# Patient Record
Sex: Male | Born: 1985 | Race: White | Hispanic: No | Marital: Single | Smoking: Current every day smoker
Health system: Southern US, Community
[De-identification: ages and names within clinical notes are randomized; demographics above are authoritative.]

## PROBLEM LIST (undated history)

## (undated) DIAGNOSIS — L0390 Cellulitis, unspecified: Secondary | ICD-10-CM

---

## 2006-12-24 ENCOUNTER — Ambulatory Visit: Payer: Self-pay | Admitting: Internal Medicine

## 2007-08-19 ENCOUNTER — Emergency Department: Payer: Self-pay | Admitting: Emergency Medicine

## 2008-05-27 ENCOUNTER — Inpatient Hospital Stay: Payer: Self-pay | Admitting: Internal Medicine

## 2008-08-29 ENCOUNTER — Emergency Department: Payer: Self-pay | Admitting: Emergency Medicine

## 2008-10-24 ENCOUNTER — Emergency Department: Payer: Self-pay | Admitting: Emergency Medicine

## 2011-03-03 ENCOUNTER — Emergency Department: Payer: Self-pay | Admitting: *Deleted

## 2012-01-08 ENCOUNTER — Emergency Department: Payer: Self-pay | Admitting: Emergency Medicine

## 2013-11-21 IMAGING — CT CT NECK WITHOUT CONTRAST
1 of 2 series · 9 of 14 positions shown, 12 images · non-contrast
Comparison: none

REASON FOR EXAM: trauma to soft tissue
COMMENTS:

[Series 3: soft tissue · axial · 0.44mm/px · z∈[-380,-150]mm · 9 of 97 slices shown, 12 images]
[im 10/97  soft-tissue]
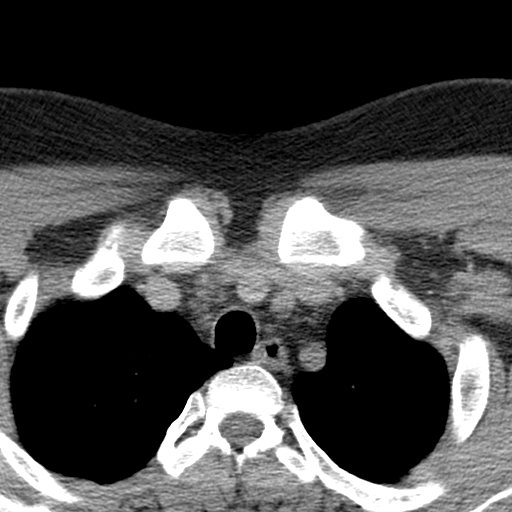
[im 10/97  bone]
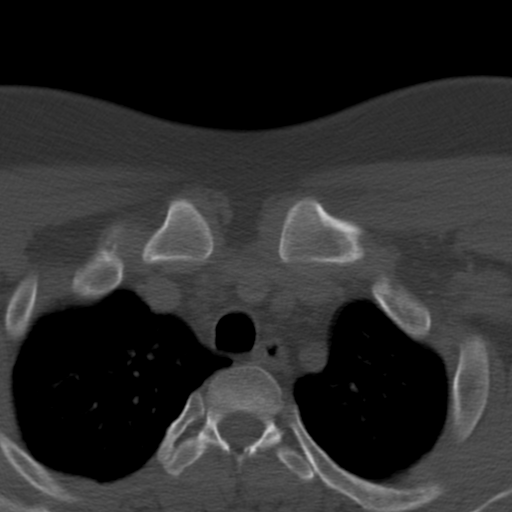
[im 20/97  bone]
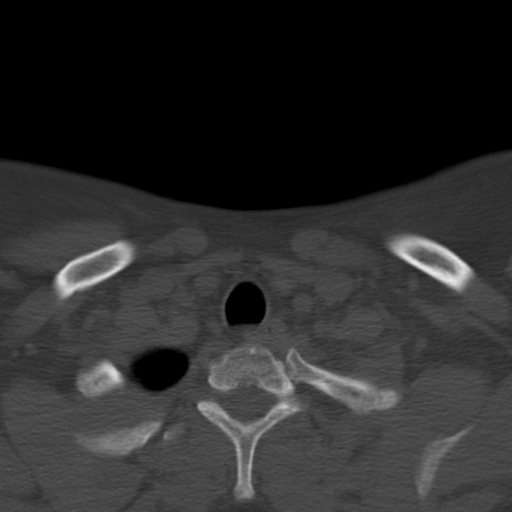
[im 29/97  bone]
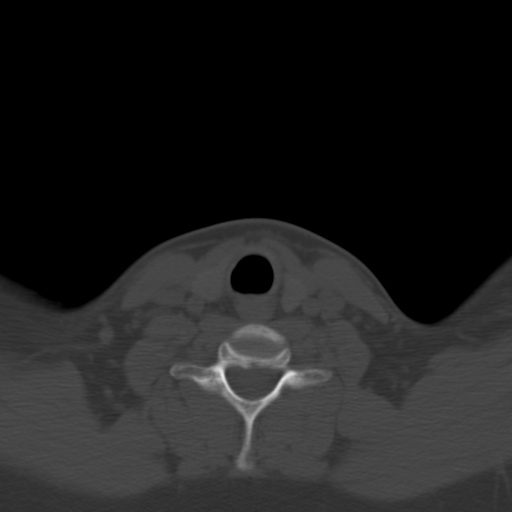
[im 39/97  bone]
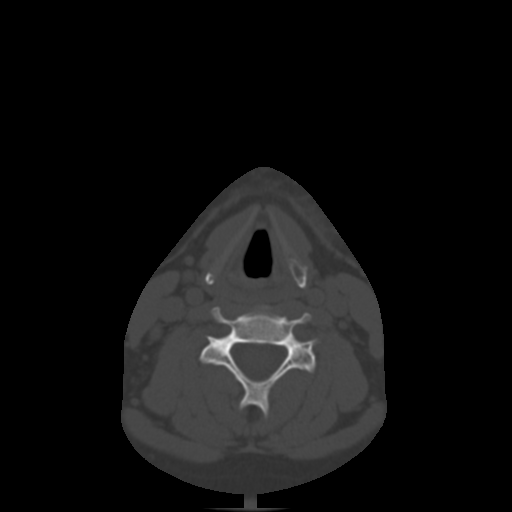
[im 49/97  soft-tissue]
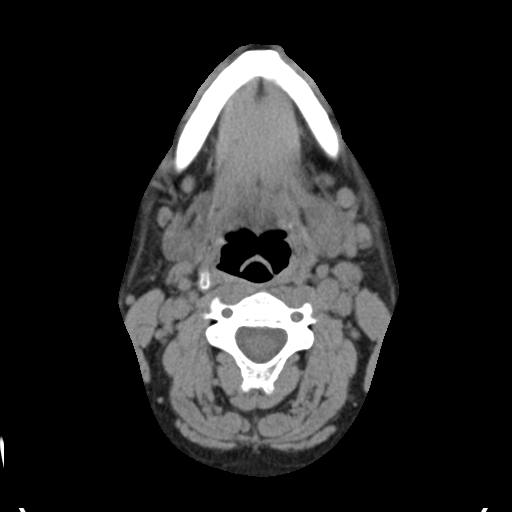
[im 49/97  bone]
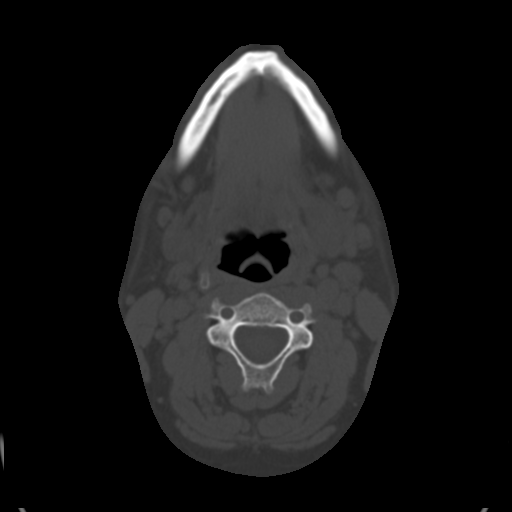
[im 58/97  bone]
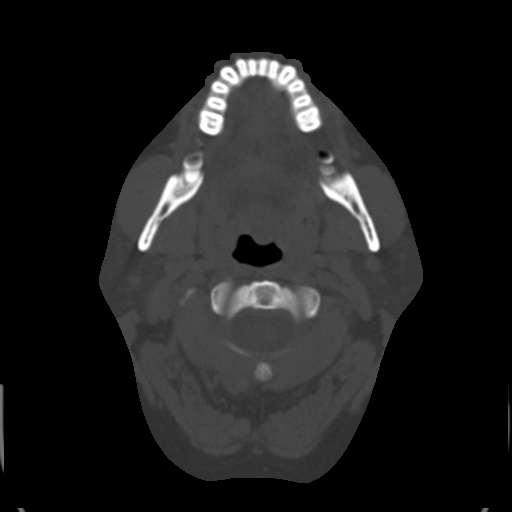
[im 68/97  bone]
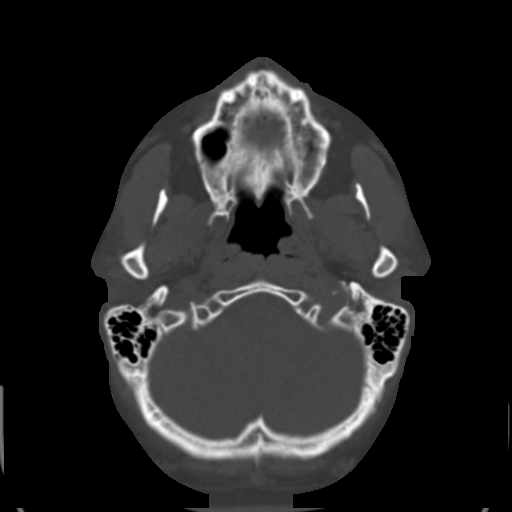
[im 77/97  bone]
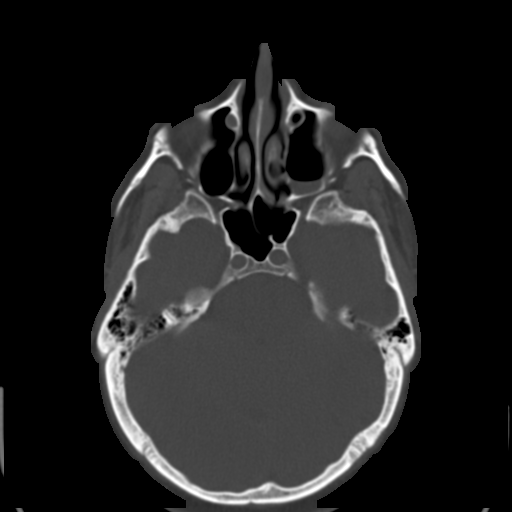
[im 87/97  soft-tissue]
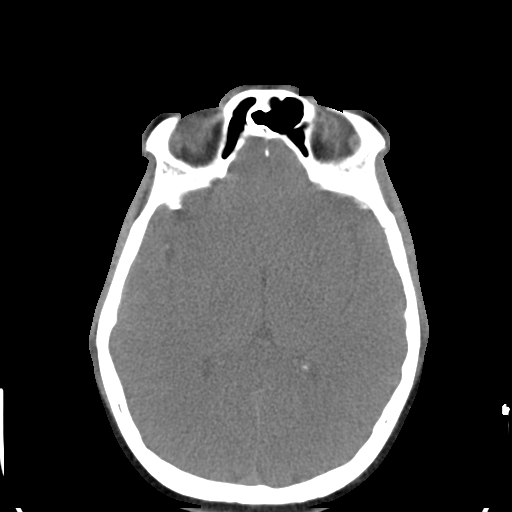
[im 87/97  bone]
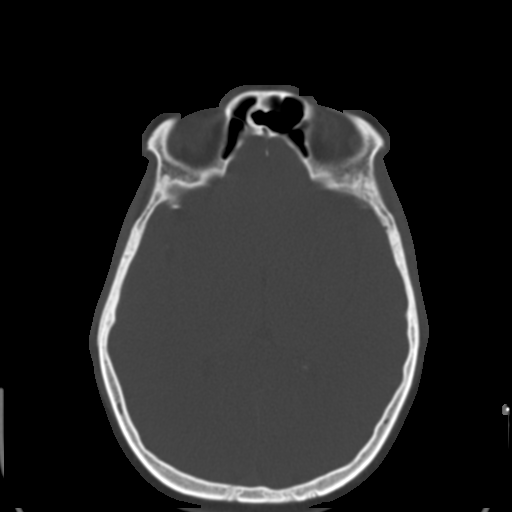

[9 of 14 positions shown; findings below may reference images not displayed]

PROCEDURE:     CT  - CT NECK WITHOUT CONTRAST  - January 08, 2012  [DATE]

RESULT:     CT of the neck without contrast is reconstructed in the axial
plane at 3.0 mm slice thickness.

There is an air-fluid level in the left maxillary sinus. No definite
fractures demonstrated. Correlate for underlying sinusitis. There is
irregularity within the right molar region of the maxilla which could
represent periodontal disease and bony erosion. Dental followup is
recommended. The mandible appears intact. The orbits appear unremarkable.
The oropharynx, nasopharynx and larynx appear to be unremarkable. The
thyroid lobes are unremarkable. The salivary glands appear within normal
limits. Nonenlarged lymph nodes are seen bilaterally in the cervical region
anteriorly. The largest nodes are approaching 9 mm in diameter.
IMPRESSION: 1. Air-fluid level in the left maxillary sinus. No definite fracture is
visualized. Differential considerations are for sinusitis or occult
fracture. A slightly prominent anterior cervical lymph nodes bilaterally.

[REDACTED]

## 2018-01-28 ENCOUNTER — Ambulatory Visit
Admission: EM | Admit: 2018-01-28 | Discharge: 2018-01-28 | Disposition: A | Discharge status: Home / Self Care | Payer: 59 | Attending: Emergency Medicine | Admitting: Emergency Medicine

## 2018-01-28 ENCOUNTER — Other Ambulatory Visit: Payer: Self-pay

## 2018-01-28 ENCOUNTER — Encounter: Payer: Self-pay | Admitting: Emergency Medicine

## 2018-01-28 DIAGNOSIS — L03115 Cellulitis of right lower limb: Secondary | ICD-10-CM

## 2018-01-28 DIAGNOSIS — L089 Local infection of the skin and subcutaneous tissue, unspecified: Secondary | ICD-10-CM | POA: Diagnosis not present

## 2018-01-28 DIAGNOSIS — R030 Elevated blood-pressure reading, without diagnosis of hypertension: Secondary | ICD-10-CM

## 2018-01-28 DIAGNOSIS — T148XXA Other injury of unspecified body region, initial encounter: Secondary | ICD-10-CM

## 2018-01-28 MED ORDER — DOXYCYCLINE HYCLATE 100 MG PO CAPS: 100.0000 mg | ORAL_CAPSULE | Freq: Two times a day (BID) | ORAL | 0 refills | Status: AC

## 2018-01-28 MED ORDER — CHLORHEXIDINE GLUCONATE 4 % EX LIQD: Freq: Every day | CUTANEOUS | 0 refills | Status: DC | PRN

## 2018-01-28 MED ORDER — CEFTRIAXONE SODIUM 1 G IJ SOLR
1.0000 g | Freq: Once | INTRAMUSCULAR | Status: AC
  Administered 2018-01-28: 1 g via INTRAMUSCULAR

## 2018-01-28 NOTE — ED Notes (Signed)
Patient shows no signs of adverse reaction to medication at this time.  

## 2018-01-28 NOTE — ED Provider Notes (Signed)
HPI  SUBJECTIVE:  Adam Cabrera is a 32 y.o. male who presents with a cut on his right lower extremity 2 or 3 days ago.  He states that cellulitis started the night before yesterday.  He reports swelling of the leg, erythema, increased temperature.  He reports intermittent, throbbing, moderate pain lasting minutes.  No fevers, body aches, vomiting although he reports nausea yesterday.  He tried covering the wound with Band-Aid, Neosporin.  Symptoms are better with elevation and worse with walking.  This aggravates the swelling.  He has a past medical history of cellulitis for which he was admitted and required IV antibiotics.  No history of MRSA, diabetes, peripheral vascular disease, peripheral arterial disease.  States that he was evaluated for both of these entities while admitted to the hospital and they were negative.  He is a smoker, no history of hypercholesterolemia, coronary disease, hypertension.  PMD: None.    History reviewed. No pertinent past medical history.  History reviewed. No pertinent surgical history.  History reviewed. No pertinent family history.  Social History   Tobacco Use  . Smoking status: Current Every Day Smoker    Types: Cigarettes  . Smokeless tobacco: Never Used  Substance Use Topics  . Alcohol use: Not Currently  . Drug use: Never     Current Facility-Administered Medications:  .  cefTRIAXone (ROCEPHIN) injection 1 g, 1 g, Intramuscular, Once, Domenick Gong, MD  Current Outpatient Medications:  .  chlorhexidine (HIBICLENS) 4 % external liquid, Apply topically daily as needed. Dilute 10-15 mL in water, Use daily when bathing for 1-2 weeks, Disp: 120 mL, Rfl: 0 .  doxycycline (VIBRAMYCIN) 100 MG capsule, Take 1 capsule (100 mg total) by mouth 2 (two) times daily for 7 days., Disp: 14 capsule, Rfl: 0  No Known Allergies   ROS  As noted in HPI.   Physical Exam  BP (S) (!) 173/83 (BP Location: Left Arm)   Pulse 79   Temp 98.5 F (36.9 C)  (Oral)   Resp 16   Ht 6\' 2"  (1.88 m)   Wt 270 lb (122.5 kg)   SpO2 95%   BMI 34.67 kg/m   Constitutional: Well developed, well nourished, no acute distress Eyes:  EOMI, conjunctiva normal bilaterally HENT: Normocephalic, atraumatic,mucus membranes moist Respiratory: Normal inspiratory effort Cardiovascular: Normal rate GI: nondistended skin: No rash, skin intact Musculoskeletal: Swelling, increased temperature, nontender, blanchable erythema measuring approximately 11 cm x 20 cm.  Marked this off with a marker for reference.  +0.5 x 0.5 ulcer lateral right lower extremity.  No crusting, expressible purulent drainage.  Minimal pain with palpation.  No subcutaneous crepitus.    Neurologic: Alert & oriented x 3, no focal neuro deficits Psychiatric: Speech and behavior appropriate   ED Course   Medications  cefTRIAXone (ROCEPHIN) injection 1 g (has no administration in time range)    No orders of the defined types were placed in this encounter.   No results found for this or any previous visit (from the past 24 hour(s)). No results found.  ED Clinical Impression  Cellulitis of right lower extremity  Infected wound  Elevated blood-pressure reading without diagnosis of hypertension   ED Assessment/Plan  1.  Infected wound/cellulitis of the right lower extremity.  Given patient's history of hospitalizations, will give 1 g of Rocephin here in clinic today and sent home on doxycycline for 7 days to also cover MRSA.  Also sending home with Hibiclens.  May return here if not getting any better.  There is nothing to culture.  To the ER if he gets worse.  2.  Elevated blood pressure. Pt hypertensive today.  Patient asymptomatic.  Pt has no historical evidence of end organ damage. Pt denies any CNS type sx such as HA, visual changes, focal paresis, or new onset seizure activity. Pt denies any CV sx such as CP, dyspnea, palpitations, pedal edema, tearing pain radiating to back or  abd. Pt denied any renal sx such as anuria or hematuria.  Advised patient to keep a log of his blood pressures and follow-up with a primary care physician of his choice in 1 to 2 weeks.  Discussed signs and symptoms of a hypertensive emergency.  Discussed MDM, treatment plan, and plan for follow-up with patient. Discussed sn/sx that should prompt return to the ED. patient agrees with plan.   Meds ordered this encounter  Medications  . cefTRIAXone (ROCEPHIN) injection 1 g  . chlorhexidine (HIBICLENS) 4 % external liquid    Sig: Apply topically daily as needed. Dilute 10-15 mL in water, Use daily when bathing for 1-2 weeks    Dispense:  120 mL    Refill:  0  . doxycycline (VIBRAMYCIN) 100 MG capsule    Sig: Take 1 capsule (100 mg total) by mouth 2 (two) times daily for 7 days.    Dispense:  14 capsule    Refill:  0    *This clinic note was created using Scientist, clinical (histocompatibility and immunogenetics)Dragon dictation software. Therefore, there may be occasional mistakes despite careful proofreading.   ?   Domenick GongMortenson, Charlii Yost, MD 01/28/18 [phone]

## 2018-01-28 NOTE — Discharge Instructions (Addendum)
Finish the doxycycline.  Use the Hibiclens as directed.  May use bacitracin and keep it covered with a Band-Aid until it heals.  If the redness is not getting better in a week, return here for reevaluation.  Go to the ER for the signs and symptoms we discussed.  Decrease your salt intake. diet and exercise will lower your blood pressure significantly. It is important to keep your blood pressure under good control, as having a elevated blood pressure for prolonged periods of time significantly increases your risk of stroke, heart attacks, kidney damage, eye damage, and other problems. Measure your blood pressure once a day, preferably at the same time every day. Keep a log of this and bring it to your next doctor's appointment.  Bring your blood pressure cuff as well. Return immediately to the ER if you start having chest pain, headache, problems seeing, problems talking, problems walking, if you feel like you're about to pass out, if you do pass out, if you have a seizure, or for any other concerns.  Here is a list of primary care providers who are taking new patients:  Dr. Elizabeth Sauereanna Jones, Dr. Schuyler AmorWilliam Plonk [address] [place] [address] [phone]  [facility] [place] [address]  [place] [address]  [phone]  [facility] [address]  [place], [place] [address] [phone]  [facility] [place] [address]  [phone] [place], [place] [address]  Here are clinics/ other resources who will see you if you do not have insurance. Some have certain criteria that you must meet. Call them and find out what they are:  [facility]: [address] [place] [place], [place] [address] Phone: [phone] Hours: First and Third Saturdays of each Month, 9 a.m. - 1 p.m.  [facility]: [address], [place], [place] [address] Phone: [phone] Hours: Tuesday, 4 p.m. - 8 p.m. Thursday, 1 p.m. - 8 p.m. Wednesday, 9 a.m. -  [facility]  [facility] [address], [place], [place] [address] Phone: [phone] Pharmacy Phone Number: [phone] Dental Phone Number: [phone] [facility] Insurance Help: [phone]  Dental Hours: Monday - Thursday, 8 a.m. - 6 p.m.  [facility] [address] [place], [place] [address] Phone: [phone] Pharmacy Phone Number: [phone] [facility] Insurance Help: [phone]  [facility] [address] [place], [place] [address] Phone: [phone] Pharmacy Phone Number: [phone] [facility] Insurance Help: [phone]  [facility] [address] [place], [place] [address] Phone: [phone] [facility] Insurance Help: [phone]   [facility] [address] [place], [place] [address] Phone: [phone]  Go to www.goodrx.com to look up your medications. This will give you a list of where you can find your prescriptions at the most affordable prices. Or ask the pharmacist what the cash price is, or if they have any other discount programs available to help make your medication more affordable. This can be less expensive than what you would pay with insurance.

## 2018-01-28 NOTE — ED Triage Notes (Signed)
Patient c/o redness, swelling, pain and warmth in his right lower leg that started 2 days ago.

## 2019-11-17 ENCOUNTER — Other Ambulatory Visit: Payer: Self-pay

## 2019-11-17 ENCOUNTER — Ambulatory Visit
Admission: EM | Admit: 2019-11-17 | Discharge: 2019-11-17 | Disposition: A | Discharge status: Home / Self Care | Payer: 59 | Attending: Family Medicine | Admitting: Family Medicine

## 2019-11-17 ENCOUNTER — Encounter: Payer: Self-pay | Admitting: Emergency Medicine

## 2019-11-17 DIAGNOSIS — L03115 Cellulitis of right lower limb: Secondary | ICD-10-CM

## 2019-11-17 HISTORY — DX: Cellulitis, unspecified: L03.90

## 2019-11-17 MED ORDER — DOXYCYCLINE HYCLATE 100 MG PO CAPS: 100.0000 mg | ORAL_CAPSULE | Freq: Two times a day (BID) | ORAL | 0 refills | Status: DC

## 2019-11-17 NOTE — Discharge Instructions (Signed)
If you worsen or fail to improve, please go to the hospital.  Take care  Dr. Adriana Simas

## 2019-11-17 NOTE — ED Provider Notes (Signed)
[facility]    CSN: [id number] Arrival date & time: 11/17/19  1715  History   Chief Complaint Chief Complaint  Patient presents with  . Recurrent Skin Infections    right lower leg   HPI   34 year old male presents with the above complaint.  Patient reports that he developed symptoms suddenly this morning.  He reports swelling and warmth as well as pain of his right lower leg.  Pain 8/10 in severity.  Patient reports a history of cellulitis.  Pain described as a burning sensation.  No relieving factors.  Patient currently has an elevated temperature of 100.2.  No documented fever at home.  No other associated symptoms.  No other complaints.  Past Medical History:  Diagnosis Date  . Cellulitis    Home Medications    Prior to Admission medications   Medication Sig Start Date End Date Taking? Authorizing Provider  doxycycline (VIBRAMYCIN) 100 MG capsule Take 1 capsule (100 mg total) by mouth 2 (two) times daily. 11/17/19   Coral Spikes, DO   Social History Social History   Tobacco Use  . Smoking status: Current Every Day Smoker    Types: Cigarettes  . Smokeless tobacco: Never Used  Substance Use Topics  . Alcohol use: Yes    Comment: socially   . Drug use: Never     Allergies   Patient has no known allergies.   Review of Systems Review of Systems  Skin:       Right lower extremity redness, warmth, pain.   Physical Exam Triage Vital Signs ED Triage Vitals  Enc Vitals Group     BP 11/17/19 1731 121/70     Pulse Rate 11/17/19 1731 (!) 110     Resp 11/17/19 1731 18     Temp 11/17/19 1731 100.2 F (37.9 C)     Temp Source 11/17/19 1731 Oral     SpO2 11/17/19 1731 100 %     Weight 11/17/19 1730 270 lb (122.5 kg)     Height 11/17/19 1730 6\' 2"  (1.88 m)     Head Circumference --      Peak Flow --      Pain Score 11/17/19 1730 8     Pain Loc --      Pain Edu? --      Excl. in [place]? --    No data found.  Updated Vital Signs BP 121/70 (BP  Location: Left Arm)   Pulse (!) 110   Temp 100.2 F (37.9 C) (Oral)   Resp 18   Ht 6\' 2"  (1.88 m)   Wt 122.5 kg   SpO2 100%   BMI 34.67 kg/m   Visual Acuity Right Eye Distance:   Left Eye Distance:   Bilateral Distance:    Right Eye Near:   Left Eye Near:    Bilateral Near:     Physical Exam Vitals and nursing note reviewed.  Constitutional:      General: He is not in acute distress.    Appearance: Normal appearance. He is not ill-appearing.  Eyes:     General:        Right eye: No discharge.        Left eye: No discharge.     Conjunctiva/sclera: Conjunctivae normal.  Cardiovascular:     Rate and Rhythm: Regular rhythm. Tachycardia present.  Pulmonary:     Effort: Pulmonary effort is normal. No respiratory distress.  Musculoskeletal:     Comments: Right lower extremity with diffuse  erythema, warmth, and tenderness to palpation.  Mild swelling.  Neurological:     Mental Status: He is alert.  Psychiatric:        Mood and Affect: Mood normal.        Behavior: Behavior normal.    UC Treatments / Results  Labs (all labs ordered are listed, but only abnormal results are displayed) Labs Reviewed - No data to display  EKG   Radiology No results found.  Procedures Procedures (including critical care time)  Medications Ordered in UC Medications - No data to display  Initial Impression / Assessment and Plan / UC Course  I have reviewed the triage vital signs and the nursing notes.  Pertinent labs & imaging results that were available during my care of the patient were reviewed by me and considered in my medical decision making (see chart for details).    34 year old male presents with cellulitis.  Treating with doxycycline.  Final Clinical Impressions(s) / UC Diagnoses   Final diagnoses:  Cellulitis of right lower extremity     Discharge Instructions     If you worsen or fail to improve, please go to the hospital.  Take care  Dr. Adriana Simas    ED  Prescriptions    Medication Sig Dispense Auth. Provider   doxycycline (VIBRAMYCIN) 100 MG capsule Take 1 capsule (100 mg total) by mouth 2 (two) times daily. 20 capsule Tommie Sams, DO     PDMP not reviewed this encounter.   Tommie Sams, [place] 11/17/19 2022

## 2019-11-17 NOTE — ED Triage Notes (Signed)
Patient c/o cellulitis to right lower leg that started this morning. He c/o swelling, pain and warm to touch to the right lower leg.

## 2020-05-15 ENCOUNTER — Encounter: Payer: Self-pay | Admitting: Emergency Medicine

## 2020-05-15 ENCOUNTER — Ambulatory Visit
Admission: EM | Admit: 2020-05-15 | Discharge: 2020-05-15 | Disposition: A | Discharge status: Home / Self Care | Payer: 59

## 2020-05-15 ENCOUNTER — Other Ambulatory Visit: Payer: Self-pay

## 2020-05-15 DIAGNOSIS — L539 Erythematous condition, unspecified: Secondary | ICD-10-CM

## 2020-05-15 DIAGNOSIS — L03115 Cellulitis of right lower limb: Secondary | ICD-10-CM | POA: Diagnosis not present

## 2020-05-15 MED ORDER — CEFTRIAXONE SODIUM 500 MG IJ SOLR
1000.0000 mg | Freq: Once | INTRAMUSCULAR | Status: AC
  Administered 2020-05-15: 1000 mg via INTRAMUSCULAR

## 2020-05-15 MED ORDER — DOXYCYCLINE HYCLATE 100 MG PO CAPS: 100.0000 mg | ORAL_CAPSULE | Freq: Two times a day (BID) | ORAL | 0 refills | Status: AC

## 2020-05-15 NOTE — ED Triage Notes (Signed)
Pt c/o cellulitis on his right lower leg. Started yesterday morning. He has redness, pain and warmth to the area. He has h/o cellulitis.

## 2020-05-15 NOTE — Discharge Instructions (Addendum)
You have an infection of the right leg.  You have been given an injection of ceftriaxone which is an antibiotic in the clinic today.  I have also sent doxycycline to the pharmacy.  Begin medicine as possible and complete the full course.  You should be seen again if you have any fever, increased redness, swelling, pain or you begin to have pain in the calf or upper leg.  Follow-up with our department as needed or emergency room for any acute worsening of symptoms.

## 2020-05-15 NOTE — ED Provider Notes (Signed)
[facility]    CSN: [id number] Arrival date & time: 05/15/20  0854      History   Chief Complaint Chief Complaint  Patient presents with  . Cellulitis    HPI Adam Cabrera is a 34 y.o. male presenting for redness, pain, warmth and swelling of a part of his right lower leg since yesterday.  Patient says he has a history of infection of this leg over the past 10 to 15 years.  Admits to about 7 infections.  His last infection was 6 months ago when he was given doxycycline which cleared the infection up.  No known history of MRSA and he says he does not know why this leg keeps getting infected.  Patient denies any fever or calf pain.  He has been taking ibuprofen for pain without much relief.  Denies any numbness, tingling, weakness or falls.  No injuries to the leg.  There are no abrasions or lacerations.  No abscesses.  Patient denies any chronic medical problems.  No other complaints or concerns today.  HPI  Past Medical History:  Diagnosis Date  . Cellulitis     There are no problems to display for this patient.   History reviewed. No pertinent surgical history.     Home Medications    Prior to Admission medications   Medication Sig Start Date End Date Taking? Authorizing Provider  buprenorphine-naloxone (SUBOXONE) 8-2 mg SUBL SL tablet Place 1 tablet under the tongue daily.   Yes [provider]  doxycycline (VIBRAMYCIN) 100 MG capsule Take 1 capsule (100 mg total) by mouth 2 (two) times daily for 10 days. 05/15/20 05/25/20  Shirlee Latch, PA-C    Family History History reviewed. No pertinent family history.  Social History Social History   Tobacco Use  . Smoking status: Current Every Day Smoker    Types: Cigarettes  . Smokeless tobacco: Never Used  Vaping Use  . Vaping Use: Never used  Substance Use Topics  . Alcohol use: Yes    Comment: socially   . Drug use: Not Currently     Allergies   Patient has no known  allergies.   Review of Systems Review of Systems  Constitutional: Negative for fatigue and fever.  Respiratory: Negative for shortness of breath.   Cardiovascular: Positive for leg swelling. Negative for chest pain.  Musculoskeletal: Negative for arthralgias, gait problem, joint swelling and myalgias.  Skin: Positive for color change. Negative for rash and wound.  Neurological: Negative for dizziness, weakness and numbness.  Hematological: Negative for adenopathy.     Physical Exam Triage Vital Signs ED Triage Vitals  Enc Vitals Group     BP 05/15/20 0926 128/80     Pulse Rate 05/15/20 0926 74     Resp 05/15/20 0926 18     Temp 05/15/20 0926 98.7 F (37.1 C)     Temp Source 05/15/20 0926 Oral     SpO2 05/15/20 0926 100 %     Weight 05/15/20 0923 270 lb 1 oz (122.5 kg)     Height 05/15/20 0923 6\' 3"  (1.905 m)     Head Circumference --      Peak Flow --      Pain Score 05/15/20 0923 7     Pain Loc --      Pain Edu? --      Excl. in [place]? --    No data found.  Updated Vital Signs BP 128/80 (BP Location: Left Arm)   Pulse  74   Temp 98.7 F (37.1 C) (Oral)   Resp 18   Ht 6\' 3"  (1.905 m)   Wt 270 lb 1 oz (122.5 kg)   SpO2 100%   BMI 33.76 kg/m       Physical Exam Vitals and nursing note reviewed.  Constitutional:      General: He is not in acute distress.    Appearance: Normal appearance. He is well-developed and normal weight. He is not ill-appearing or toxic-appearing.  HENT:     Head: Normocephalic and atraumatic.  Eyes:     General: No scleral icterus.    Conjunctiva/sclera: Conjunctivae normal.  Cardiovascular:     Rate and Rhythm: Normal rate and regular rhythm.     Heart sounds: No murmur heard.   Pulmonary:     Effort: Pulmonary effort is normal. No respiratory distress.     Breath sounds: Normal breath sounds.  Musculoskeletal:     Cervical back: Neck supple.  Skin:    General: Skin is warm and dry.     Comments: Of the right lower leg there is  erythema, swelling and warmth from the ankle to the mid tibia.  Area is diffusely tender.  No calf tenderness.  Full range of motion of the knee and ankle.  Neurological:     General: No focal deficit present.     Mental Status: He is alert. Mental status is at baseline.     Motor: No weakness.     Gait: Gait normal.  Psychiatric:        Mood and Affect: Mood normal.        Behavior: Behavior normal.        Thought Content: Thought content normal.          UC Treatments / Results  Labs (all labs ordered are listed, but only abnormal results are displayed) Labs Reviewed - No data to display  EKG   Radiology No results found.  Procedures Procedures (including critical care time)  Medications Ordered in UC Medications  cefTRIAXone (ROCEPHIN) injection 1,000 mg (1,000 mg Intramuscular Given 05/15/20 0955)    Initial Impression / Assessment and Plan / UC Course  I have reviewed the triage vital signs and the nursing notes.  Pertinent labs & imaging results that were available during my care of the patient were reviewed by me and considered in my medical decision making (see chart for details).    Exam consistent with cellulitis.  Low concern for DVT since patient states this is the exact way that his leg is left with infections in the past.  He also does not have any calf pain.  No bleeding or clotting disorders.  Since it has been 6 months since last infection we will treat again with doxycycline since it has worked in the past.  Patient given 1 g ceftriaxone in the clinic and sent home with doxycycline.  Advised to elevate the leg.  Advised that he can take ibuprofen and Tylenol for pain relief.  ED precautions discussed and advised him to return if he develops a fever, any increased redness swelling or warmth or pain.  Patient agreeable.  Final Clinical Impressions(s) / UC Diagnoses   Final diagnoses:  Cellulitis of leg, right  Erythematous condition     Discharge  Instructions     You have an infection of the right leg.  You have been given an injection of ceftriaxone which is an antibiotic in the clinic today.  I have also sent  doxycycline to the pharmacy.  Begin medicine as possible and complete the full course.  You should be seen again if you have any fever, increased redness, swelling, pain or you begin to have pain in the calf or upper leg.  Follow-up with our department as needed or emergency room for any acute worsening of symptoms.    ED Prescriptions    Medication Sig Dispense Auth. Provider   doxycycline (VIBRAMYCIN) 100 MG capsule Take 1 capsule (100 mg total) by mouth 2 (two) times daily for 10 days. 20 capsule Shirlee Latch, PA-C     PDMP not reviewed this encounter.   Shirlee Latch, PA-C 05/15/20 1816

## 2020-09-19 ENCOUNTER — Other Ambulatory Visit: Payer: Self-pay

## 2020-09-19 ENCOUNTER — Ambulatory Visit
Admission: EM | Admit: 2020-09-19 | Discharge: 2020-09-19 | Disposition: A | Discharge status: Home / Self Care | Payer: 59 | Attending: Family Medicine | Admitting: Family Medicine

## 2020-09-19 ENCOUNTER — Encounter: Payer: Self-pay | Admitting: Emergency Medicine

## 2020-09-19 DIAGNOSIS — L03115 Cellulitis of right lower limb: Secondary | ICD-10-CM

## 2020-09-19 MED ORDER — SULFAMETHOXAZOLE-TRIMETHOPRIM 800-160 MG PO TABS: 1.0000 | ORAL_TABLET | Freq: Two times a day (BID) | ORAL | 0 refills | Status: AC

## 2020-09-19 NOTE — ED Provider Notes (Signed)
[facility]    CSN: [id number] Arrival date & time: 09/19/20  0955      History   Chief Complaint Chief Complaint  Patient presents with  . Cellulitis   HPI  35 year old male with a history of recurrent cellulitis of right lower extremity presents with the above complaint.  He states that this started yesterday.  He reports redness, swelling, and pain of his right lower leg.  He has a history of recurrent cellulitis.  I believe that this is primarily brought on by venous stasis.  No fall, trauma, injury.  He reports some chills.  No documented fever.  No relieving factors.  His pain is 8/10 in severity.  No other complaints.  Home Medications    Prior to Admission medications   Medication Sig Start Date End Date Taking? Authorizing Provider  buprenorphine-naloxone (SUBOXONE) 8-2 mg SUBL SL tablet Place 1 tablet under the tongue daily.   Yes [provider]  sulfamethoxazole-trimethoprim (BACTRIM DS) 800-160 MG tablet Take 1 tablet by mouth 2 (two) times daily for 10 days. 09/19/20 09/29/20 Yes Tommie Sams, DO   Social History Social History   Tobacco Use  . Smoking status: Current Every Day Smoker    Types: Cigarettes  . Smokeless tobacco: Never Used  Vaping Use  . Vaping Use: Never used  Substance Use Topics  . Alcohol use: Yes    Comment: socially   . Drug use: Not Currently     Allergies   Patient has no known allergies.   Review of Systems Review of Systems  Constitutional: Positive for chills.  Skin:       RLE redness/pain.   Physical Exam Triage Vital Signs ED Triage Vitals  Enc Vitals Group     BP 09/19/20 1034 137/89     Pulse Rate 09/19/20 1034 73     Resp 09/19/20 1034 18     Temp 09/19/20 1034 98.2 F (36.8 C)     Temp Source 09/19/20 1034 Oral     SpO2 09/19/20 1034 100 %     Weight 09/19/20 1032 270 lb 1 oz (122.5 kg)     Height 09/19/20 1032 6\' 2"  (1.88 m)     Head Circumference --      Peak Flow --      Pain  Score 09/19/20 1032 8     Pain Loc --      Pain Edu? --      Excl. in GC? --    Updated Vital Signs BP 137/89 (BP Location: Right Arm)   Pulse 73   Temp 98.2 F (36.8 C) (Oral)   Resp 18   Ht 6\' 2"  (1.88 m)   Wt 122.5 kg   SpO2 100%   BMI 34.67 kg/m   Visual Acuity Right Eye Distance:   Left Eye Distance:   Bilateral Distance:    Right Eye Near:   Left Eye Near:    Bilateral Near:     Physical Exam Vitals and nursing note reviewed.  Constitutional:      General: He is not in acute distress.    Appearance: Normal appearance. He is obese. He is not ill-appearing.  HENT:     Head: Normocephalic and atraumatic.  Eyes:     General:        Right eye: No discharge.        Left eye: No discharge.     Conjunctiva/sclera: Conjunctivae normal.  Cardiovascular:     Rate and  Rhythm: Normal rate and regular rhythm.  Pulmonary:     Effort: Pulmonary effort is normal.     Breath sounds: Normal breath sounds. No wheezing or rales.  Musculoskeletal:     Comments: Right lower extremity with redness, tenderness and swelling.  Neurological:     Mental Status: He is alert.  Psychiatric:        Mood and Affect: Mood normal.        Behavior: Behavior normal.    UC Treatments / Results  Labs (all labs ordered are listed, but only abnormal results are displayed) Labs Reviewed - No data to display  EKG   Radiology No results found.  Procedures Procedures (including critical care time)  Medications Ordered in UC Medications - No data to display  Initial Impression / Assessment and Plan / UC Course  I have reviewed the triage vital signs and the nursing notes.  Pertinent labs & imaging results that were available during my care of the patient were reviewed by me and considered in my medical decision making (see chart for details).    35 year old male presents with recurrent cellulitis.  Placing on Bactrim.  Information given regarding vascular surgery given his recurrent  issues with cellulitis.  I suspect that this is brought on by venous stasis.  Final Clinical Impressions(s) / UC Diagnoses   Final diagnoses:  Cellulitis of right lower extremity     Discharge Instructions     Elevate legs.  If you develop fever, go to the ER.  Call Vascular for an appt.  Take care  Dr. Adriana Simas    ED Prescriptions    Medication Sig Dispense Auth. Provider   sulfamethoxazole-trimethoprim (BACTRIM DS) 800-160 MG tablet Take 1 tablet by mouth 2 (two) times daily for 10 days. 20 tablet Tommie Sams, DO     PDMP not reviewed this encounter.   Tommie Sams, [place] 09/19/20 1222

## 2020-09-19 NOTE — Discharge Instructions (Addendum)
Elevate legs.  If you develop fever, go to the ER.  Call Vascular for an appt.  Take care  Dr. Adriana Simas

## 2020-09-19 NOTE — ED Triage Notes (Signed)
Pt c/o redness, swelling and pain in his right lower leg. He states he noticed it yesterday. He has h/o cellulitis in this limb.

## 2023-02-12 ENCOUNTER — Other Ambulatory Visit: Payer: Self-pay

## 2023-02-12 ENCOUNTER — Emergency Department (HOSPITAL_COMMUNITY): Payer: Medicaid Other

## 2023-02-12 ENCOUNTER — Emergency Department (HOSPITAL_COMMUNITY)
Admission: EM | Admit: 2023-02-12 | Discharge: 2023-02-12 | Disposition: A | Discharge status: Home / Self Care | Payer: Medicaid Other | Attending: Emergency Medicine | Admitting: Emergency Medicine

## 2023-02-12 ENCOUNTER — Encounter (HOSPITAL_COMMUNITY): Payer: Self-pay

## 2023-02-12 DIAGNOSIS — Z23 Encounter for immunization: Secondary | ICD-10-CM | POA: Insufficient documentation

## 2023-02-12 DIAGNOSIS — S80811A Abrasion, right lower leg, initial encounter: Secondary | ICD-10-CM | POA: Diagnosis not present

## 2023-02-12 DIAGNOSIS — R161 Splenomegaly, not elsewhere classified: Secondary | ICD-10-CM

## 2023-02-12 DIAGNOSIS — S40012A Contusion of left shoulder, initial encounter: Secondary | ICD-10-CM | POA: Diagnosis not present

## 2023-02-12 DIAGNOSIS — Y92410 Unspecified street and highway as the place of occurrence of the external cause: Secondary | ICD-10-CM | POA: Diagnosis not present

## 2023-02-12 DIAGNOSIS — S80812A Abrasion, left lower leg, initial encounter: Secondary | ICD-10-CM | POA: Insufficient documentation

## 2023-02-12 DIAGNOSIS — S4992XA Unspecified injury of left shoulder and upper arm, initial encounter: Secondary | ICD-10-CM | POA: Diagnosis present

## 2023-02-12 DIAGNOSIS — S32009A Unspecified fracture of unspecified lumbar vertebra, initial encounter for closed fracture: Secondary | ICD-10-CM

## 2023-02-12 DIAGNOSIS — S299XXA Unspecified injury of thorax, initial encounter: Secondary | ICD-10-CM | POA: Insufficient documentation

## 2023-02-12 DIAGNOSIS — S060X0A Concussion without loss of consciousness, initial encounter: Secondary | ICD-10-CM

## 2023-02-12 DIAGNOSIS — L03115 Cellulitis of right lower limb: Secondary | ICD-10-CM

## 2023-02-12 DIAGNOSIS — S3991XA Unspecified injury of abdomen, initial encounter: Secondary | ICD-10-CM | POA: Insufficient documentation

## 2023-02-12 DIAGNOSIS — S0990XA Unspecified injury of head, initial encounter: Secondary | ICD-10-CM | POA: Diagnosis not present

## 2023-02-12 LAB — CBC WITH DIFFERENTIAL/PLATELET
Abs Immature Granulocytes: 0.05 10*3/uL (ref 0.00–0.07)
Basophils Absolute: 0 10*3/uL (ref 0.0–0.1)
Basophils Relative: 0 %
Eosinophils Absolute: 0.1 10*3/uL (ref 0.0–0.5)
Eosinophils Relative: 2 %
HCT: 34 % — ABNORMAL LOW (ref 39.0–52.0)
Hemoglobin: 11 g/dL — ABNORMAL LOW (ref 13.0–17.0)
Immature Granulocytes: 1 %
Lymphocytes Relative: 15 %
Lymphs Abs: 1 10*3/uL (ref 0.7–4.0)
MCH: 29.3 pg (ref 26.0–34.0)
MCHC: 32.4 g/dL (ref 30.0–36.0)
MCV: 90.4 fL (ref 80.0–100.0)
Monocytes Absolute: 0.6 10*3/uL (ref 0.1–1.0)
Monocytes Relative: 9 %
Neutro Abs: 4.9 10*3/uL (ref 1.7–7.7)
Neutrophils Relative %: 73 %
Platelets: 118 10*3/uL — ABNORMAL LOW (ref 150–400)
RBC: 3.76 MIL/uL — ABNORMAL LOW (ref 4.22–5.81)
RDW: 15.5 % (ref 11.5–15.5)
WBC: 6.5 10*3/uL (ref 4.0–10.5)
nRBC: 0 % (ref 0.0–0.2)

## 2023-02-12 LAB — BASIC METABOLIC PANEL
Anion gap: 6 (ref 5–15)
BUN: 9 mg/dL (ref 6–20)
CO2: 26 mmol/L (ref 22–32)
Calcium: 8 mg/dL — ABNORMAL LOW (ref 8.9–10.3)
Chloride: 105 mmol/L (ref 98–111)
Creatinine, Ser: 0.67 mg/dL (ref 0.61–1.24)
GFR, Estimated: >60 mL/min (ref >60)
Glucose, Bld: 113 mg/dL — ABNORMAL HIGH (ref 70–99)
Potassium: 3.1 mmol/L — ABNORMAL LOW (ref 3.5–5.1)
Sodium: 137 mmol/L (ref 135–145)

## 2023-02-12 MED ORDER — LIDOCAINE 5 % EX PTCH: 1.0000 | MEDICATED_PATCH | CUTANEOUS | 0 refills | Status: DC

## 2023-02-12 MED ORDER — HYDROCODONE-ACETAMINOPHEN 5-325 MG PO TABS: 1.0000 | ORAL_TABLET | Freq: Four times a day (QID) | ORAL | 0 refills | Status: DC | PRN

## 2023-02-12 MED ORDER — CEPHALEXIN 500 MG PO CAPS: 500.0000 mg | ORAL_CAPSULE | Freq: Four times a day (QID) | ORAL | 0 refills | Status: AC

## 2023-02-12 MED ORDER — POTASSIUM CHLORIDE CRYS ER 20 MEQ PO TBCR
40.0000 meq | EXTENDED_RELEASE_TABLET | Freq: Once | ORAL | Status: AC
  Administered 2023-02-12: 40 meq via ORAL
  Filled 2023-02-12: qty 2

## 2023-02-12 MED ORDER — TETANUS-DIPHTH-ACELL PERTUSSIS 5-2.5-18.5 LF-MCG/0.5 IM SUSY
0.5000 mL | PREFILLED_SYRINGE | Freq: Once | INTRAMUSCULAR | Status: AC
  Administered 2023-02-12: 0.5 mL via INTRAMUSCULAR
  Filled 2023-02-12: qty 0.5

## 2023-02-12 MED ORDER — CEPHALEXIN 500 MG PO CAPS: 500.0000 mg | ORAL_CAPSULE | Freq: Four times a day (QID) | ORAL | 0 refills | Status: DC

## 2023-02-12 MED ORDER — LIDOCAINE 5 % EX PTCH
1.0000 | MEDICATED_PATCH | CUTANEOUS | Status: DC
  Administered 2023-02-12: 1 via TRANSDERMAL
  Filled 2023-02-12: qty 1

## 2023-02-12 MED ORDER — HYDROCODONE-ACETAMINOPHEN 5-325 MG PO TABS
1.0000 | ORAL_TABLET | Freq: Once | ORAL | Status: AC
  Administered 2023-02-12: 1 via ORAL
  Filled 2023-02-12: qty 1

## 2023-02-12 MED ORDER — IOHEXOL 300 MG/ML  SOLN
100.0000 mL | Freq: Once | INTRAMUSCULAR | Status: AC | PRN
  Administered 2023-02-12: 100 mL via INTRAVENOUS
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
## 2023-03-17 ENCOUNTER — Other Ambulatory Visit: Payer: Self-pay | Admitting: Neurosurgery

## 2023-03-17 DIAGNOSIS — M5136 Other intervertebral disc degeneration, lumbar region: Secondary | ICD-10-CM

## 2023-03-23 ENCOUNTER — Emergency Department: Payer: Medicaid Other

## 2023-03-23 ENCOUNTER — Other Ambulatory Visit: Payer: Self-pay

## 2023-03-23 ENCOUNTER — Inpatient Hospital Stay
Admission: EM | Admit: 2023-03-23 | Discharge: 2023-05-13 | DRG: 853 | Disposition: A | Discharge status: Home / Self Care | Payer: Medicaid Other | Attending: Internal Medicine | Admitting: Internal Medicine

## 2023-03-23 DIAGNOSIS — F17210 Nicotine dependence, cigarettes, uncomplicated: Secondary | ICD-10-CM | POA: Diagnosis present

## 2023-03-23 DIAGNOSIS — Z8661 Personal history of infections of the central nervous system: Secondary | ICD-10-CM

## 2023-03-23 DIAGNOSIS — M4620 Osteomyelitis of vertebra, site unspecified: Secondary | ICD-10-CM | POA: Diagnosis not present

## 2023-03-23 DIAGNOSIS — M5440 Lumbago with sciatica, unspecified side: Secondary | ICD-10-CM | POA: Diagnosis present

## 2023-03-23 DIAGNOSIS — M48061 Spinal stenosis, lumbar region without neurogenic claudication: Secondary | ICD-10-CM | POA: Diagnosis present

## 2023-03-23 DIAGNOSIS — M8600 Acute hematogenous osteomyelitis, unspecified site: Secondary | ICD-10-CM | POA: Diagnosis not present

## 2023-03-23 DIAGNOSIS — M4626 Osteomyelitis of vertebra, lumbar region: Secondary | ICD-10-CM | POA: Diagnosis present

## 2023-03-23 DIAGNOSIS — R29898 Other symptoms and signs involving the musculoskeletal system: Secondary | ICD-10-CM | POA: Insufficient documentation

## 2023-03-23 DIAGNOSIS — Z6837 Body mass index (BMI) 37.0-37.9, adult: Secondary | ICD-10-CM | POA: Diagnosis not present

## 2023-03-23 DIAGNOSIS — I10 Essential (primary) hypertension: Secondary | ICD-10-CM | POA: Diagnosis present

## 2023-03-23 DIAGNOSIS — J341 Cyst and mucocele of nose and nasal sinus: Secondary | ICD-10-CM | POA: Diagnosis present

## 2023-03-23 DIAGNOSIS — D696 Thrombocytopenia, unspecified: Secondary | ICD-10-CM | POA: Diagnosis not present

## 2023-03-23 DIAGNOSIS — M532X6 Spinal instabilities, lumbar region: Secondary | ICD-10-CM | POA: Diagnosis present

## 2023-03-23 DIAGNOSIS — B1920 Unspecified viral hepatitis C without hepatic coma: Secondary | ICD-10-CM | POA: Insufficient documentation

## 2023-03-23 DIAGNOSIS — K5900 Constipation, unspecified: Secondary | ICD-10-CM | POA: Diagnosis not present

## 2023-03-23 DIAGNOSIS — A419 Sepsis, unspecified organism: Secondary | ICD-10-CM

## 2023-03-23 DIAGNOSIS — Z79899 Other long term (current) drug therapy: Secondary | ICD-10-CM | POA: Diagnosis not present

## 2023-03-23 DIAGNOSIS — M8608 Acute hematogenous osteomyelitis, other sites: Secondary | ICD-10-CM | POA: Diagnosis not present

## 2023-03-23 DIAGNOSIS — E876 Hypokalemia: Secondary | ICD-10-CM | POA: Diagnosis present

## 2023-03-23 DIAGNOSIS — M549 Dorsalgia, unspecified: Secondary | ICD-10-CM | POA: Insufficient documentation

## 2023-03-23 DIAGNOSIS — A4101 Sepsis due to Methicillin susceptible Staphylococcus aureus: Secondary | ICD-10-CM | POA: Diagnosis not present

## 2023-03-23 DIAGNOSIS — M40299 Other kyphosis, site unspecified: Secondary | ICD-10-CM | POA: Diagnosis present

## 2023-03-23 DIAGNOSIS — F1910 Other psychoactive substance abuse, uncomplicated: Secondary | ICD-10-CM | POA: Diagnosis present

## 2023-03-23 DIAGNOSIS — M5416 Radiculopathy, lumbar region: Secondary | ICD-10-CM | POA: Diagnosis present

## 2023-03-23 DIAGNOSIS — Z23 Encounter for immunization: Secondary | ICD-10-CM | POA: Diagnosis not present

## 2023-03-23 DIAGNOSIS — R161 Splenomegaly, not elsewhere classified: Secondary | ICD-10-CM | POA: Diagnosis not present

## 2023-03-23 DIAGNOSIS — K648 Other hemorrhoids: Secondary | ICD-10-CM | POA: Diagnosis present

## 2023-03-23 DIAGNOSIS — D509 Iron deficiency anemia, unspecified: Secondary | ICD-10-CM | POA: Insufficient documentation

## 2023-03-23 DIAGNOSIS — B182 Chronic viral hepatitis C: Secondary | ICD-10-CM | POA: Diagnosis present

## 2023-03-23 DIAGNOSIS — K6812 Psoas muscle abscess: Secondary | ICD-10-CM | POA: Diagnosis not present

## 2023-03-23 DIAGNOSIS — D649 Anemia, unspecified: Secondary | ICD-10-CM | POA: Insufficient documentation

## 2023-03-23 DIAGNOSIS — R519 Headache, unspecified: Secondary | ICD-10-CM | POA: Diagnosis not present

## 2023-03-23 DIAGNOSIS — E669 Obesity, unspecified: Secondary | ICD-10-CM | POA: Diagnosis present

## 2023-03-23 DIAGNOSIS — D61818 Other pancytopenia: Secondary | ICD-10-CM | POA: Diagnosis not present

## 2023-03-23 DIAGNOSIS — G061 Intraspinal abscess and granuloma: Secondary | ICD-10-CM | POA: Diagnosis present

## 2023-03-23 DIAGNOSIS — E871 Hypo-osmolality and hyponatremia: Secondary | ICD-10-CM | POA: Diagnosis present

## 2023-03-23 DIAGNOSIS — D508 Other iron deficiency anemias: Secondary | ICD-10-CM | POA: Diagnosis not present

## 2023-03-23 DIAGNOSIS — K7460 Unspecified cirrhosis of liver: Secondary | ICD-10-CM | POA: Diagnosis present

## 2023-03-23 DIAGNOSIS — F1990 Other psychoactive substance use, unspecified, uncomplicated: Secondary | ICD-10-CM | POA: Diagnosis not present

## 2023-03-23 DIAGNOSIS — M4646 Discitis, unspecified, lumbar region: Secondary | ICD-10-CM | POA: Diagnosis not present

## 2023-03-23 DIAGNOSIS — M4856XA Collapsed vertebra, not elsewhere classified, lumbar region, initial encounter for fracture: Secondary | ICD-10-CM | POA: Diagnosis not present

## 2023-03-23 DIAGNOSIS — L853 Xerosis cutis: Secondary | ICD-10-CM | POA: Insufficient documentation

## 2023-03-23 DIAGNOSIS — B9562 Methicillin resistant Staphylococcus aureus infection as the cause of diseases classified elsewhere: Secondary | ICD-10-CM | POA: Diagnosis not present

## 2023-03-23 DIAGNOSIS — M869 Osteomyelitis, unspecified: Secondary | ICD-10-CM

## 2023-03-23 DIAGNOSIS — K766 Portal hypertension: Secondary | ICD-10-CM | POA: Diagnosis present

## 2023-03-23 DIAGNOSIS — K625 Hemorrhage of anus and rectum: Secondary | ICD-10-CM | POA: Diagnosis not present

## 2023-03-23 DIAGNOSIS — R7881 Bacteremia: Secondary | ICD-10-CM

## 2023-03-23 DIAGNOSIS — M5441 Lumbago with sciatica, right side: Secondary | ICD-10-CM | POA: Diagnosis not present

## 2023-03-23 DIAGNOSIS — D62 Acute posthemorrhagic anemia: Secondary | ICD-10-CM | POA: Diagnosis not present

## 2023-03-23 DIAGNOSIS — B9561 Methicillin susceptible Staphylococcus aureus infection as the cause of diseases classified elsewhere: Secondary | ICD-10-CM | POA: Diagnosis not present

## 2023-03-23 LAB — TROPONIN I (HIGH SENSITIVITY)
Troponin I (High Sensitivity): 7 ng/L (ref <18)
Troponin I (High Sensitivity): 7 ng/L (ref <18)

## 2023-03-23 LAB — CBC WITH DIFFERENTIAL/PLATELET
Abs Immature Granulocytes: 0.22 10*3/uL — ABNORMAL HIGH (ref 0.00–0.07)
Basophils Absolute: 0 10*3/uL (ref 0.0–0.1)
Basophils Relative: 0 %
Eosinophils Absolute: 0 10*3/uL (ref 0.0–0.5)
Eosinophils Relative: 0 %
HCT: 42.5 % (ref 39.0–52.0)
Hemoglobin: 14.5 g/dL (ref 13.0–17.0)
Immature Granulocytes: 1 %
Lymphocytes Relative: 7 %
Lymphs Abs: 1.1 10*3/uL (ref 0.7–4.0)
MCH: 26.9 pg (ref 26.0–34.0)
MCHC: 34.1 g/dL (ref 30.0–36.0)
MCV: 78.8 fL — ABNORMAL LOW (ref 80.0–100.0)
Monocytes Absolute: 1.2 10*3/uL — ABNORMAL HIGH (ref 0.1–1.0)
Monocytes Relative: 7 %
Neutro Abs: 14 10*3/uL — ABNORMAL HIGH (ref 1.7–7.7)
Neutrophils Relative %: 85 %
Platelets: 263 10*3/uL (ref 150–400)
RBC: 5.39 MIL/uL (ref 4.22–5.81)
RDW: 15.8 % — ABNORMAL HIGH (ref 11.5–15.5)
WBC: 16.6 10*3/uL — ABNORMAL HIGH (ref 4.0–10.5)
nRBC: 0 % (ref 0.0–0.2)

## 2023-03-23 LAB — URINALYSIS, ROUTINE W REFLEX MICROSCOPIC
Bilirubin Urine: NEGATIVE
Glucose, UA: NEGATIVE mg/dL
Hgb urine dipstick: NEGATIVE
Ketones, ur: 20 mg/dL — AB
Leukocytes,Ua: NEGATIVE
Nitrite: NEGATIVE
Protein, ur: 30 mg/dL — AB
Specific Gravity, Urine: 1.028 (ref 1.005–1.030)
pH: 5 (ref 5.0–8.0)

## 2023-03-23 LAB — PROTIME-INR
INR: 1.3 — ABNORMAL HIGH (ref 0.8–1.2)
Prothrombin Time: 16.2 seconds — ABNORMAL HIGH (ref 11.4–15.2)

## 2023-03-23 LAB — COMPREHENSIVE METABOLIC PANEL
ALT: 55 U/L — ABNORMAL HIGH (ref 0–44)
AST: 56 U/L — ABNORMAL HIGH (ref 15–41)
Albumin: 3.8 g/dL (ref 3.5–5.0)
Alkaline Phosphatase: 111 U/L (ref 38–126)
Anion gap: 12 (ref 5–15)
BUN: 17 mg/dL (ref 6–20)
CO2: 20 mmol/L — ABNORMAL LOW (ref 22–32)
Calcium: 9.4 mg/dL (ref 8.9–10.3)
Chloride: 99 mmol/L (ref 98–111)
Creatinine, Ser: 0.91 mg/dL (ref 0.61–1.24)
GFR, Estimated: >60 mL/min (ref >60)
Glucose, Bld: 126 mg/dL — ABNORMAL HIGH (ref 70–99)
Potassium: 3.3 mmol/L — ABNORMAL LOW (ref 3.5–5.1)
Sodium: 131 mmol/L — ABNORMAL LOW (ref 135–145)
Total Bilirubin: 3.1 mg/dL — ABNORMAL HIGH (ref 0.3–1.2)
Total Protein: 9.5 g/dL — ABNORMAL HIGH (ref 6.5–8.1)

## 2023-03-23 LAB — CK: Total CK: 38 U/L — ABNORMAL LOW (ref 49–397)

## 2023-03-23 LAB — LIPASE, BLOOD: Lipase: 35 U/L (ref 11–51)

## 2023-03-23 LAB — LACTIC ACID, PLASMA
Lactic Acid, Venous: 2.2 mmol/L (ref 0.5–1.9)
Lactic Acid, Venous: 1.3 mmol/L (ref 0.5–1.9)

## 2023-03-23 LAB — MAGNESIUM: Magnesium: 1.9 mg/dL (ref 1.7–2.4)

## 2023-03-23 LAB — APTT: aPTT: 35 seconds (ref 24–36)

## 2023-03-23 MED ORDER — METRONIDAZOLE 500 MG/100ML IV SOLN
500.0000 mg | Freq: Two times a day (BID) | INTRAVENOUS | Status: DC
  Administered 2023-03-24: 500 mg via INTRAVENOUS
  Filled 2023-03-23: qty 100

## 2023-03-23 MED ORDER — HEPARIN SODIUM (PORCINE) 5000 UNIT/ML IJ SOLN: 5000.0000 [IU] | Freq: Three times a day (TID) | INTRAMUSCULAR | Status: DC

## 2023-03-23 MED ORDER — SODIUM CHLORIDE 0.9 % IV SOLN
2.0000 g | Freq: Once | INTRAVENOUS | Status: AC
  Administered 2023-03-23: 2 g via INTRAVENOUS
  Filled 2023-03-23: qty 12.5

## 2023-03-23 MED ORDER — LORAZEPAM 2 MG/ML IJ SOLN
1.0000 mg | INTRAMUSCULAR | Status: DC | PRN
  Administered 2023-03-23 – 2023-04-01 (×5): 1 mg via INTRAVENOUS
  Filled 2023-03-23 (×4): qty 1

## 2023-03-23 MED ORDER — SODIUM CHLORIDE 0.9% FLUSH
3.0000 mL | Freq: Two times a day (BID) | INTRAVENOUS | Status: DC
  Administered 2023-03-23 – 2023-04-24 (×52): 3 mL via INTRAVENOUS

## 2023-03-23 MED ORDER — VANCOMYCIN HCL IN DEXTROSE 1-5 GM/200ML-% IV SOLN: 1000.0000 mg | Freq: Once | INTRAVENOUS | Status: DC

## 2023-03-23 MED ORDER — ACETAMINOPHEN 325 MG PO TABS
650.0000 mg | ORAL_TABLET | Freq: Four times a day (QID) | ORAL | Status: DC | PRN
  Administered 2023-03-27 – 2023-04-01 (×5): 650 mg via ORAL
  Filled 2023-03-23 (×5): qty 2

## 2023-03-23 MED ORDER — HYDROMORPHONE HCL 1 MG/ML IJ SOLN
0.5000 mg | Freq: Once | INTRAMUSCULAR | Status: AC
  Administered 2023-03-23: 0.5 mg via INTRAVENOUS
  Filled 2023-03-23: qty 0.5

## 2023-03-23 MED ORDER — VANCOMYCIN HCL 2000 MG/400ML IV SOLN
2000.0000 mg | Freq: Once | INTRAVENOUS | Status: AC
  Administered 2023-03-23: 2000 mg via INTRAVENOUS
  Filled 2023-03-23: qty 400

## 2023-03-23 MED ORDER — HYDROCODONE-ACETAMINOPHEN 5-325 MG PO TABS
1.0000 | ORAL_TABLET | ORAL | Status: DC | PRN
  Administered 2023-03-24 – 2023-03-27 (×11): 1 via ORAL
  Filled 2023-03-23 (×11): qty 1

## 2023-03-23 MED ORDER — SODIUM CHLORIDE 0.9 % IV SOLN
2.0000 g | Freq: Three times a day (TID) | INTRAVENOUS | Status: DC
  Administered 2023-03-24: 2 g via INTRAVENOUS
  Filled 2023-03-23 (×2): qty 12.5

## 2023-03-23 MED ORDER — MORPHINE SULFATE (PF) 2 MG/ML IV SOLN
2.0000 mg | INTRAVENOUS | Status: DC | PRN
  Administered 2023-03-24 – 2023-03-25 (×6): 2 mg via INTRAVENOUS
  Filled 2023-03-23 (×6): qty 1

## 2023-03-23 MED ORDER — VANCOMYCIN HCL 500 MG/100ML IV SOLN
500.0000 mg | Freq: Once | INTRAVENOUS | Status: AC
  Administered 2023-03-23: 500 mg via INTRAVENOUS
  Filled 2023-03-23: qty 100

## 2023-03-23 MED ORDER — IOHEXOL 300 MG/ML  SOLN
100.0000 mL | Freq: Once | INTRAMUSCULAR | Status: AC | PRN
  Administered 2023-03-23: 100 mL via INTRAVENOUS

## 2023-03-23 MED ORDER — MORPHINE SULFATE (PF) 4 MG/ML IV SOLN
4.0000 mg | Freq: Once | INTRAVENOUS | Status: AC
  Administered 2023-03-23: 4 mg via INTRAVENOUS
  Filled 2023-03-23: qty 1

## 2023-03-23 MED ORDER — VANCOMYCIN HCL 1750 MG/350ML IV SOLN
1750.0000 mg | Freq: Two times a day (BID) | INTRAVENOUS | Status: DC
  Administered 2023-03-24: 1750 mg via INTRAVENOUS
  Filled 2023-03-23: qty 350

## 2023-03-23 MED ORDER — LACTATED RINGERS IV BOLUS
1000.0000 mL | Freq: Once | INTRAVENOUS | Status: AC
  Administered 2023-03-23: 1000 mL via INTRAVENOUS

## 2023-03-23 MED ORDER — ACETAMINOPHEN 650 MG RE SUPP: 650.0000 mg | Freq: Four times a day (QID) | RECTAL | Status: DC | PRN

## 2023-03-23 MED ORDER — METRONIDAZOLE 500 MG/100ML IV SOLN
500.0000 mg | Freq: Once | INTRAVENOUS | Status: AC
  Administered 2023-03-23: 500 mg via INTRAVENOUS
  Filled 2023-03-23: qty 100

## 2023-03-23 MED ORDER — GADOBUTROL 1 MMOL/ML IV SOLN
10.0000 mL | Freq: Once | INTRAVENOUS | Status: AC | PRN
  Administered 2023-03-23: 10 mL via INTRAVENOUS
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
## 2023-03-23 NOTE — Progress Notes (Signed)
PHARMACY -  BRIEF ANTIBIOTIC NOTE   Pharmacy has received consult(s) for Vancomycin from an ED provider.  The patient's profile has been reviewed for ht / wt / allergies / indication / available labs.    One time order(s) placed for: Vancomycin 2000 mg per pt wt: 129.3 kg  Further antibiotics/pharmacy consults should be ordered by admitting physician if indicated.                       Thank you, Otelia Sergeant, PharmD, [facility] 03/23/2023 4:49 PM

## 2023-03-23 NOTE — H&P (View-Only) (Signed)
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 Pt at risk given previous IV drug use. To further identify/characterize the infection we'd like to see an MRI of the spine within without contrast to evaluate for any sites of epidural abscesses. Can use a tlso brace for comfort.  Full evaluation and consult note to follow in AM.
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
## 2023-03-23 NOTE — ED Notes (Signed)
Patient to MRI.

## 2023-03-23 NOTE — Progress Notes (Signed)
CODE SEPSIS - PHARMACY COMMUNICATION  **Broad Spectrum Antibiotics should be administered within 1 hour of Sepsis diagnosis**  Time Code Sepsis Called/Page Received: 1649  Antibiotics Ordered: Cefepime, Flagyl, Vancomycin  Time of 1st antibiotic administration: 1715  Otelia Sergeant, PharmD, [facility] 03/23/2023 4:50 PM

## 2023-03-23 NOTE — Progress Notes (Signed)
Pharmacy Antibiotic Note  Adam Cabrera is a 37 y.o. male admitted on 03/23/2023 with  osteomyelitis .  Pharmacy has been consulted for Vanc, Cefepime  dosing.  Plan: Cefepime 2 gm IV X 1 given in ED on 8/18 @ 1715. Cefepime 2 gm IV Q8H ordered to start on 8/19 @ 0100.  Vancomycin 2 gm IV X 1 given in ED on 8/18 @ 1906.  Additional Vanc 500 mg IV X 1 given to make total loading dose of 2500 mg. Vancomyin 1750 mg IV Q12H ordered to start on 8/19 @ 0700.  AUC = 494.1 Vanc trough = 12.4   Height: 6\' 2"  (188 cm) Weight: 129.3 kg (285 lb) IBW/kg (Calculated) : 82.2  Temp (24hrs), Avg:98.2 F (36.8 C), Min:98 F (36.7 C), Max:98.4 F (36.9 C)  Recent Labs  Lab 03/23/23 1614 03/23/23 1807  WBC 16.6*  --   CREATININE 0.91  --   LATICACIDVEN 2.2* 1.3    Estimated Creatinine Clearance: 155.7 mL/min (by C-G formula based on SCr of 0.91 mg/dL).    No Known Allergies  Antimicrobials this admission:   >>    >>   Dose adjustments this admission:   Microbiology results:  BCx:   UCx:    Sputum:    MRSA PCR:   Thank you for allowing pharmacy to be a part of this patient's care.  Filomena Pokorney D 03/23/2023 11:54 PM

## 2023-03-23 NOTE — ED Notes (Signed)
Called for TLSO brace 
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
## 2023-03-23 NOTE — Hospital Course (Addendum)
37 y.o. male with medical history significant for hypertension, scrotal cellulitis, history of IV drug use, presents with low back pain.  MRI of the lumbar spine showed Epidural extension with epidural phlegmon/abscess within the ventral epidural space extending from L1-2 through L3-4.  Blood culture positive for Staph aureus, patient treated with cefepime and vancomycin.  Patient has been seen by ID and neurosurgery, L spine washout performed 8/19. Patient condition had improved up to 8/26, wound VAC removed.  Patient will need 6 weeks of IV antibiotics.  Per TOC, there is no placement option.  Patient currently on on continuous nafcillin infusion.  Patient had a worsening back pain since 9/3, Patient was brought to the OR again 9/5. Had: 1. Transforaminal Lumbar Interbody Fusion L2-3.  2. Posterolateral arthrodesis L1 to L4 3. Posterior segmental instrumentation L1 to L4; 4.  Lumbar decompression with total facetectomy on the right at L2-3;  5.  Allograft placement in the 2 3 disc space for arthrodesis.  One of the wound cultures from 9/5 growing rare Staph aureus.  9/13 replacing potassium.
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
## 2023-03-23 NOTE — ED Triage Notes (Addendum)
Refer to First Nurst Note at 1413. Pt endorses that he is having bilateral leg numbness since feeling a pop in his back. Pt declines any incontinence.

## 2023-03-23 NOTE — Assessment & Plan Note (Addendum)
Status post neurosurgical procedure for L2-L3 decompression for epidural phlegmon on 8/19.  On 9/5, patient had a another neurosurgical procedure which included transforaminal lumbar interbody fusion of L2/L3, posterolateral arthrodesis L1-L4, posterior segment instrumentation L1-L4, lumbar decompression with total fasciotomy on the right at L2-L3, allograft placement in the 2/3 disc space for arthrodesis.  Patient on continuous nafcillin.  Continue rifampin.  ID recommends 6 weeks total of IV antibiotics.  Since one of the cultures from 9/5 growing Staph aureus may have to be 6 weeks of antibiotics from that culture.

## 2023-03-23 NOTE — ED Notes (Signed)
Patient to CT.

## 2023-03-23 NOTE — Progress Notes (Signed)
Orthopedic Tech Progress Note Patient Details:  [name] 09/30/1985 [id number]  Order for TLSO called into [facility]. Pt is being admitted for surgery tomorrow morning. RN verified this is a stat order.  Patient ID: Adam Cabrera, male   DOB: [birth year], 37 y.o.   MRN: [id number]  Docia Furl 03/23/2023, 7:50 PM

## 2023-03-23 NOTE — Progress Notes (Signed)
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 Pt at risk given previous IV drug use. To further identify/characterize the infection we'd like to see an MRI of the spine within without contrast to evaluate for any sites of epidural abscesses. Can use a tlso brace for comfort.  Full evaluation and consult note to follow in AM.

## 2023-03-23 NOTE — Sepsis Progress Note (Signed)
Sepsis protocol is being followed by [facility]. 

## 2023-03-24 ENCOUNTER — Other Ambulatory Visit: Payer: Medicaid Other

## 2023-03-24 ENCOUNTER — Inpatient Hospital Stay: Payer: Medicaid Other

## 2023-03-24 ENCOUNTER — Other Ambulatory Visit: Payer: Self-pay

## 2023-03-24 ENCOUNTER — Encounter
Admission: EM | Disposition: A | Discharge status: Home / Self Care | Payer: Self-pay | Source: Home / Self Care | Attending: Internal Medicine

## 2023-03-24 ENCOUNTER — Encounter: Payer: Self-pay | Admitting: Internal Medicine

## 2023-03-24 DIAGNOSIS — M4646 Discitis, unspecified, lumbar region: Secondary | ICD-10-CM | POA: Diagnosis not present

## 2023-03-24 DIAGNOSIS — G061 Intraspinal abscess and granuloma: Secondary | ICD-10-CM | POA: Diagnosis not present

## 2023-03-24 DIAGNOSIS — A419 Sepsis, unspecified organism: Secondary | ICD-10-CM

## 2023-03-24 DIAGNOSIS — M4626 Osteomyelitis of vertebra, lumbar region: Secondary | ICD-10-CM | POA: Diagnosis not present

## 2023-03-24 DIAGNOSIS — E669 Obesity, unspecified: Secondary | ICD-10-CM | POA: Diagnosis not present

## 2023-03-24 DIAGNOSIS — A4101 Sepsis due to Methicillin susceptible Staphylococcus aureus: Secondary | ICD-10-CM | POA: Diagnosis not present

## 2023-03-24 DIAGNOSIS — J341 Cyst and mucocele of nose and nasal sinus: Secondary | ICD-10-CM | POA: Diagnosis present

## 2023-03-24 DIAGNOSIS — M5440 Lumbago with sciatica, unspecified side: Secondary | ICD-10-CM | POA: Diagnosis not present

## 2023-03-24 DIAGNOSIS — M4620 Osteomyelitis of vertebra, site unspecified: Secondary | ICD-10-CM | POA: Diagnosis not present

## 2023-03-24 HISTORY — PX: LUMBAR LAMINECTOMY FOR EPIDURAL ABSCESS: SHX5956

## 2023-03-24 LAB — BLOOD CULTURE ID PANEL (REFLEXED) - BCID2

## 2023-03-24 LAB — URINE DRUG SCREEN, QUALITATIVE (ARMC ONLY)
Amphetamines, Ur Screen: NOT DETECTED
Barbiturates, Ur Screen: NOT DETECTED
Benzodiazepine, Ur Scrn: NOT DETECTED
Cannabinoid 50 Ng, Ur ~~LOC~~: NOT DETECTED
Cocaine Metabolite,Ur ~~LOC~~: NOT DETECTED
MDMA (Ecstasy)Ur Screen: NOT DETECTED
Methadone Scn, Ur: NOT DETECTED
Opiate, Ur Screen: POSITIVE — AB
Phencyclidine (PCP) Ur S: NOT DETECTED
Tricyclic, Ur Screen: POSITIVE — AB

## 2023-03-24 LAB — CBC
HCT: 35.9 % — ABNORMAL LOW (ref 39.0–52.0)
Hemoglobin: 12.1 g/dL — ABNORMAL LOW (ref 13.0–17.0)
MCH: 27.3 pg (ref 26.0–34.0)
MCHC: 33.7 g/dL (ref 30.0–36.0)
MCV: 81 fL (ref 80.0–100.0)
Platelets: 194 10*3/uL (ref 150–400)
RBC: 4.43 MIL/uL (ref 4.22–5.81)
RDW: 16.2 % — ABNORMAL HIGH (ref 11.5–15.5)
WBC: 9.9 10*3/uL (ref 4.0–10.5)
nRBC: 0 % (ref 0.0–0.2)

## 2023-03-24 LAB — COMPREHENSIVE METABOLIC PANEL
ALT: 44 U/L (ref 0–44)
AST: 37 U/L (ref 15–41)
Albumin: 3.2 g/dL — ABNORMAL LOW (ref 3.5–5.0)
Alkaline Phosphatase: 78 U/L (ref 38–126)
Anion gap: 9 (ref 5–15)
BUN: 13 mg/dL (ref 6–20)
CO2: 21 mmol/L — ABNORMAL LOW (ref 22–32)
Calcium: 8.5 mg/dL — ABNORMAL LOW (ref 8.9–10.3)
Chloride: 103 mmol/L (ref 98–111)
Creatinine, Ser: 0.59 mg/dL — ABNORMAL LOW (ref 0.61–1.24)
GFR, Estimated: >60 mL/min (ref >60)
Glucose, Bld: 98 mg/dL (ref 70–99)
Potassium: 3.2 mmol/L — ABNORMAL LOW (ref 3.5–5.1)
Sodium: 133 mmol/L — ABNORMAL LOW (ref 135–145)
Total Bilirubin: 1.5 mg/dL — ABNORMAL HIGH (ref 0.3–1.2)
Total Protein: 8.1 g/dL (ref 6.5–8.1)

## 2023-03-24 LAB — RAPID HIV SCREEN (HIV 1/2 AB+AG)
HIV 1/2 Antibodies: NONREACTIVE
HIV-1 P24 Antigen - HIV24: NONREACTIVE

## 2023-03-24 LAB — HEMOGLOBIN A1C
Hgb A1c MFr Bld: 5.5 % (ref 4.8–5.6)
Mean Plasma Glucose: 111.15 mg/dL

## 2023-03-24 LAB — SURGICAL PCR SCREEN
MRSA, PCR: NEGATIVE
Staphylococcus aureus: POSITIVE — AB

## 2023-03-24 LAB — TYPE AND SCREEN
ABO/RH(D): O NEG
Antibody Screen: NEGATIVE

## 2023-03-24 LAB — GLUCOSE, CAPILLARY: Glucose-Capillary: 88 mg/dL (ref 70–99)

## 2023-03-24 LAB — MAGNESIUM: Magnesium: 2.2 mg/dL (ref 1.7–2.4)

## 2023-03-24 LAB — HIV ANTIBODY (ROUTINE TESTING W REFLEX): HIV Screen 4th Generation wRfx: NONREACTIVE

## 2023-03-24 SURGERY — LUMBAR LAMINECTOMY FOR EPIDURAL ABSCESS
Anesthesia: General

## 2023-03-24 MED ORDER — HYDROMORPHONE HCL 1 MG/ML IJ SOLN
0.5000 mg | INTRAMUSCULAR | Status: AC | PRN
  Administered 2023-03-24 (×4): 0.5 mg via INTRAVENOUS

## 2023-03-24 MED ORDER — VANCOMYCIN HCL 1000 MG IV SOLR
INTRAVENOUS | Status: DC | PRN
  Administered 2023-03-24: 1000 mg via TOPICAL

## 2023-03-24 MED ORDER — GLYCOPYRROLATE 0.2 MG/ML IJ SOLN
INTRAMUSCULAR | Status: DC | PRN
  Administered 2023-03-24: .2 mg via INTRAVENOUS

## 2023-03-24 MED ORDER — HYDROMORPHONE HCL 1 MG/ML IJ SOLN
INTRAMUSCULAR | Status: AC
  Filled 2023-03-24: qty 1

## 2023-03-24 MED ORDER — ONDANSETRON HCL 4 MG/2ML IJ SOLN
INTRAMUSCULAR | Status: AC
  Filled 2023-03-24: qty 2

## 2023-03-24 MED ORDER — SODIUM CHLORIDE 0.9 % IV SOLN
INTRAVENOUS | Status: DC | PRN
  Administered 2023-03-24: 10 mL

## 2023-03-24 MED ORDER — MUPIROCIN 2 % EX OINT
1.0000 | TOPICAL_OINTMENT | Freq: Two times a day (BID) | CUTANEOUS | Status: AC
  Administered 2023-03-24 – 2023-03-28 (×8): 1 via NASAL
  Filled 2023-03-24: qty 22

## 2023-03-24 MED ORDER — LACTATED RINGERS IV SOLN: INTRAVENOUS | Status: DC | PRN

## 2023-03-24 MED ORDER — PROPOFOL 10 MG/ML IV BOLUS
INTRAVENOUS | Status: DC | PRN
  Administered 2023-03-24: 200 mg via INTRAVENOUS

## 2023-03-24 MED ORDER — LIDOCAINE HCL (PF) 2 % IJ SOLN
INTRAMUSCULAR | Status: AC
  Filled 2023-03-24: qty 5

## 2023-03-24 MED ORDER — SURGIPHOR WOUND IRRIGATION SYSTEM - OPTIME
TOPICAL | Status: DC | PRN
  Administered 2023-03-24: 450 mL

## 2023-03-24 MED ORDER — BUPIVACAINE LIPOSOME 1.3 % IJ SUSP
INTRAMUSCULAR | Status: AC
  Filled 2023-03-24: qty 20

## 2023-03-24 MED ORDER — VANCOMYCIN HCL 1000 MG IV SOLR
INTRAVENOUS | Status: AC
  Filled 2023-03-24: qty 20

## 2023-03-24 MED ORDER — ACETAMINOPHEN 10 MG/ML IV SOLN
INTRAVENOUS | Status: AC
  Filled 2023-03-24: qty 100

## 2023-03-24 MED ORDER — KETAMINE HCL 10 MG/ML IJ SOLN
INTRAMUSCULAR | Status: DC | PRN
Start: 2023-03-24 — End: 2023-03-24
  Administered 2023-03-24: 20 mg via INTRAVENOUS
  Administered 2023-03-24: 10 mg via INTRAVENOUS

## 2023-03-24 MED ORDER — ORAL CARE MOUTH RINSE: 15.0000 mL | OROMUCOSAL | Status: DC | PRN

## 2023-03-24 MED ORDER — DEXAMETHASONE SODIUM PHOSPHATE 10 MG/ML IJ SOLN
INTRAMUSCULAR | Status: DC | PRN
  Administered 2023-03-24: 10 mg via INTRAVENOUS

## 2023-03-24 MED ORDER — LIDOCAINE HCL (CARDIAC) PF 100 MG/5ML IV SOSY
PREFILLED_SYRINGE | INTRAVENOUS | Status: DC | PRN
  Administered 2023-03-24: 100 mg via INTRAVENOUS

## 2023-03-24 MED ORDER — 0.9 % SODIUM CHLORIDE (POUR BTL) OPTIME
TOPICAL | Status: DC | PRN
  Administered 2023-03-24: 1000 mL

## 2023-03-24 MED ORDER — POTASSIUM CHLORIDE 10 MEQ/100ML IV SOLN
10.0000 meq | INTRAVENOUS | Status: AC
  Administered 2023-03-24 (×2): 10 meq via INTRAVENOUS
  Filled 2023-03-24 (×2): qty 100

## 2023-03-24 MED ORDER — FENTANYL CITRATE (PF) 100 MCG/2ML IJ SOLN
INTRAMUSCULAR | Status: DC | PRN
  Administered 2023-03-24: 100 ug via INTRAVENOUS

## 2023-03-24 MED ORDER — FENTANYL CITRATE (PF) 100 MCG/2ML IJ SOLN
INTRAMUSCULAR | Status: AC
  Filled 2023-03-24: qty 2

## 2023-03-24 MED ORDER — KETAMINE HCL 50 MG/5ML IJ SOSY
PREFILLED_SYRINGE | INTRAMUSCULAR | Status: AC
  Filled 2023-03-24: qty 5

## 2023-03-24 MED ORDER — NICOTINE 21 MG/24HR TD PT24
21.0000 mg | MEDICATED_PATCH | Freq: Every day | TRANSDERMAL | Status: DC
  Administered 2023-03-24 – 2023-04-26 (×18): 21 mg via TRANSDERMAL
  Filled 2023-03-24 (×34): qty 1

## 2023-03-24 MED ORDER — SODIUM CHLORIDE (PF) 0.9 % IJ SOLN
INTRAMUSCULAR | Status: AC
  Filled 2023-03-24: qty 20

## 2023-03-24 MED ORDER — MIDAZOLAM HCL 2 MG/2ML IJ SOLN
1.0000 mg | Freq: Once | INTRAMUSCULAR | Status: AC
  Administered 2023-03-24: 1 mg via INTRAVENOUS

## 2023-03-24 MED ORDER — MIDAZOLAM HCL 2 MG/2ML IJ SOLN
INTRAMUSCULAR | Status: AC
  Filled 2023-03-24: qty 2

## 2023-03-24 MED ORDER — CEFAZOLIN SODIUM-DEXTROSE 2-4 GM/100ML-% IV SOLN
2.0000 g | Freq: Three times a day (TID) | INTRAVENOUS | Status: DC
  Administered 2023-03-24 – 2023-03-25 (×3): 2 g via INTRAVENOUS
  Filled 2023-03-24 (×4): qty 100

## 2023-03-24 MED ORDER — BUPIVACAINE HCL (PF) 0.5 % IJ SOLN
INTRAMUSCULAR | Status: AC
  Filled 2023-03-24: qty 30

## 2023-03-24 MED ORDER — SURGIFLO WITH THROMBIN (HEMOSTATIC MATRIX KIT) OPTIME
TOPICAL | Status: DC | PRN
Start: 2023-03-24 — End: 2023-03-24
  Administered 2023-03-24: 2 via TOPICAL

## 2023-03-24 MED ORDER — ONDANSETRON HCL 4 MG/2ML IJ SOLN
INTRAMUSCULAR | Status: DC | PRN
  Administered 2023-03-24: 4 mg via INTRAVENOUS

## 2023-03-24 MED ORDER — CHLORHEXIDINE GLUCONATE CLOTH 2 % EX PADS
6.0000 | MEDICATED_PAD | Freq: Every day | CUTANEOUS | Status: DC
  Administered 2023-03-24 – 2023-03-31 (×7): 6 via TOPICAL

## 2023-03-24 MED ORDER — ROCURONIUM BROMIDE 100 MG/10ML IV SOLN
INTRAVENOUS | Status: DC | PRN
  Administered 2023-03-24: 20 mg via INTRAVENOUS
  Administered 2023-03-24 (×2): 10 mg via INTRAVENOUS
  Administered 2023-03-24: 50 mg via INTRAVENOUS

## 2023-03-24 MED ORDER — EPINEPHRINE PF 1 MG/ML IJ SOLN
INTRAMUSCULAR | Status: AC
  Filled 2023-03-24: qty 1

## 2023-03-24 MED ORDER — ACETAMINOPHEN 10 MG/ML IV SOLN
INTRAVENOUS | Status: DC | PRN
  Administered 2023-03-24: 1000 mg via INTRAVENOUS

## 2023-03-24 MED ORDER — DEXAMETHASONE SODIUM PHOSPHATE 10 MG/ML IJ SOLN
INTRAMUSCULAR | Status: AC
  Filled 2023-03-24: qty 1

## 2023-03-24 MED ORDER — MIDAZOLAM HCL 2 MG/2ML IJ SOLN
INTRAMUSCULAR | Status: DC | PRN
  Administered 2023-03-24: 2 mg via INTRAVENOUS

## 2023-03-24 MED ORDER — PROPOFOL 10 MG/ML IV BOLUS
INTRAVENOUS | Status: AC
  Filled 2023-03-24: qty 20

## 2023-03-24 MED ORDER — PROPOFOL 1000 MG/100ML IV EMUL
INTRAVENOUS | Status: AC
  Filled 2023-03-24: qty 100

## 2023-03-24 MED ORDER — SUGAMMADEX SODIUM 200 MG/2ML IV SOLN
INTRAVENOUS | Status: DC | PRN
  Administered 2023-03-24: 400 mg via INTRAVENOUS

## 2023-03-24 MED ORDER — HYDROMORPHONE HCL 1 MG/ML IJ SOLN
0.2500 mg | INTRAMUSCULAR | Status: DC | PRN
  Administered 2023-03-24 (×4): 0.5 mg via INTRAVENOUS

## 2023-03-24 MED ORDER — GLYCOPYRROLATE 0.2 MG/ML IJ SOLN
INTRAMUSCULAR | Status: AC
  Filled 2023-03-24: qty 1

## 2023-03-24 MED ORDER — PROPOFOL 500 MG/50ML IV EMUL
INTRAVENOUS | Status: DC | PRN
  Administered 2023-03-24: 100 ug/kg/min via INTRAVENOUS

## 2023-03-24 MED ORDER — SODIUM CHLORIDE 0.9 % IV SOLN: INTRAVENOUS | Status: DC | PRN

## 2023-03-24 SURGICAL SUPPLY — 38 items
ADH SKN CLS APL DERMABOND .7 (GAUZE/BANDAGES/DRESSINGS) ×1
AGENT HMST KT MTR STRL THRMB (HEMOSTASIS) ×2
BASIN KIT SINGLE STR (MISCELLANEOUS) ×1 IMPLANT
BLADE BOVIE TIP EXT 4 (BLADE) IMPLANT
BRUSH SCRUB EZ 4% CHG (MISCELLANEOUS) ×1 IMPLANT
BUR NEURO DRILL SOFT 3.0X3.8M (BURR) IMPLANT
DERMABOND ADVANCED .7 DNX12 (GAUZE/BANDAGES/DRESSINGS) ×1 IMPLANT
DRAPE C ARM PK CFD 31 SPINE (DRAPES) ×1 IMPLANT
DRAPE LAPAROTOMY 100X77 ABD (DRAPES) ×1 IMPLANT
DRSG TEGADERM 4X4.75 (GAUZE/BANDAGES/DRESSINGS) IMPLANT
DRSG TELFA 3X8 NADH STRL (GAUZE/BANDAGES/DRESSINGS) ×1 IMPLANT
ELECT REM PT RETURN 9FT ADLT (ELECTROSURGICAL) ×1
ELECTRODE REM PT RTRN 9FT ADLT (ELECTROSURGICAL) ×1 IMPLANT
GLOVE BIOGEL PI IND STRL 8 (GLOVE) ×1 IMPLANT
GLOVE SURG SYN 7.5 E (GLOVE) ×2 IMPLANT
GLOVE SURG SYN 7.5 PF PI (GLOVE) ×2 IMPLANT
GOWN STRL REUS W/ TWL XL LVL3 (GOWN DISPOSABLE) ×2 IMPLANT
GOWN STRL REUS W/TWL XL LVL3 (GOWN DISPOSABLE) ×2
HEMOVAC 400CC 10FR (MISCELLANEOUS) ×1 IMPLANT
KIT PREVENA INCISION MGT 13 (CANNISTER) IMPLANT
KIT SHOWERHEAD PULSAVAC (MISCELLANEOUS) IMPLANT
KIT TURNOVER KIT A (KITS) ×1 IMPLANT
KIT WILSON FRAME (KITS) ×1 IMPLANT
MANIFOLD NEPTUNE II (INSTRUMENTS) ×1 IMPLANT
NDL SAFETY ECLIP 18X1.5 (MISCELLANEOUS) ×1 IMPLANT
NS IRRIG 1000ML POUR BTL (IV SOLUTION) ×1 IMPLANT
PACK LAMINECTOMY ARMC (PACKS) ×1 IMPLANT
PAD ARMBOARD 7.5X6 YLW CONV (MISCELLANEOUS) ×2 IMPLANT
SOLUTION IRRIG SURGIPHOR (IV SOLUTION) ×1 IMPLANT
STAPLER SKIN PROX 35W (STAPLE) ×1 IMPLANT
SURGIFLO W/THROMBIN 8M KIT (HEMOSTASIS) IMPLANT
SUT DVC VLOC 3-0 CL 6 P-12 (SUTURE) ×1 IMPLANT
SUT ETHILON 3-0 (SUTURE) ×1 IMPLANT
SUT VIC AB 0 CT1 27 (SUTURE) ×1
SUT VIC AB 0 CT1 27XCR 8 STRN (SUTURE) ×1 IMPLANT
SUT VIC AB 2-0 CT1 18 (SUTURE) ×1 IMPLANT
TRAP FLUID SMOKE EVACUATOR (MISCELLANEOUS) ×1 IMPLANT
WATER STERILE IRR 500ML POUR (IV SOLUTION) ×1 IMPLANT

## 2023-03-24 NOTE — Assessment & Plan Note (Deleted)
2/2 to epidural abscess. Strict bed rest. Pain control.  NS on board - Dr.smith.

## 2023-03-24 NOTE — Assessment & Plan Note (Addendum)
BMI 32.92

## 2023-03-24 NOTE — Progress Notes (Signed)
PHARMACY - PHYSICIAN COMMUNICATION CRITICAL VALUE ALERT - BLOOD CULTURE IDENTIFICATION (BCID)  Adam Cabrera is an 37 y.o. male who presented to [facility] on 03/23/2023 with a chief complaint of epidural abscess  Assessment:  3 of 4 (1 aerobic, 2 anaerobic): GPCs; staph aureus; no resistance genes detected   Name of physician contacted: Chipper Herb, MD  Current antibiotics:  08/19 cefepime >> 08/19 metronidazole >> 08/19 vancomycin >>  Changes to prescribed antibiotics recommended:  Recommendations accepted by provider to change to cefazolin  Results for orders placed or performed during the hospital encounter of 03/23/23  Blood Culture ID Panel (Reflexed) (Collected: 03/23/2023  4:15 PM)  Result Value Ref Range   Enterococcus faecalis NOT DETECTED NOT DETECTED   Enterococcus Faecium NOT DETECTED NOT DETECTED   Listeria monocytogenes NOT DETECTED NOT DETECTED   Staphylococcus species DETECTED (A) NOT DETECTED   Staphylococcus aureus (BCID) DETECTED (A) NOT DETECTED   Staphylococcus epidermidis NOT DETECTED NOT DETECTED   Staphylococcus lugdunensis NOT DETECTED NOT DETECTED   Streptococcus species NOT DETECTED NOT DETECTED   Streptococcus agalactiae NOT DETECTED NOT DETECTED   Streptococcus pneumoniae NOT DETECTED NOT DETECTED   Streptococcus pyogenes NOT DETECTED NOT DETECTED   A.calcoaceticus-baumannii NOT DETECTED NOT DETECTED   Bacteroides fragilis NOT DETECTED NOT DETECTED   Enterobacterales NOT DETECTED NOT DETECTED   Enterobacter cloacae complex NOT DETECTED NOT DETECTED   Escherichia coli NOT DETECTED NOT DETECTED   Klebsiella aerogenes NOT DETECTED NOT DETECTED   Klebsiella oxytoca NOT DETECTED NOT DETECTED   Klebsiella pneumoniae NOT DETECTED NOT DETECTED   Proteus species NOT DETECTED NOT DETECTED   Salmonella species NOT DETECTED NOT DETECTED   Serratia marcescens NOT DETECTED NOT DETECTED   Haemophilus influenzae NOT DETECTED NOT DETECTED   Neisseria meningitidis NOT  DETECTED NOT DETECTED   Pseudomonas aeruginosa NOT DETECTED NOT DETECTED   Stenotrophomonas maltophilia NOT DETECTED NOT DETECTED   Candida albicans NOT DETECTED NOT DETECTED   Candida auris NOT DETECTED NOT DETECTED   Candida glabrata NOT DETECTED NOT DETECTED   Candida krusei NOT DETECTED NOT DETECTED   Candida parapsilosis NOT DETECTED NOT DETECTED   Candida tropicalis NOT DETECTED NOT DETECTED   Cryptococcus neoformans/gattii NOT DETECTED NOT DETECTED   Meth resistant mecA/C and MREJ NOT DETECTED NOT DETECTED    Lowella Bandy 03/24/2023  9:32 AM

## 2023-03-24 NOTE — Progress Notes (Signed)
Attending Progress Note  History: Adam Cabrera is here for sepsis and a multifocal abscess.  He has been having worsening back pain and worsening lower extremity weakness numbness and ability to ambulate over the past 2 to 3 weeks.  Came to the emergency department yesterday for evaluation was found to have discitis osteomyelitis with a epidural abscess on MRI.  Given his weakness we are planning for decompression and tissue sampling.  Physical Exam: Vitals:   03/24/23 1100 03/24/23 1200  BP: (!) 122/91 (!) 118/101  Pulse: 100 100  Resp: 18 20  Temp:    SpO2: 95% 99%    AA Ox3 CNI  Continues to have right worse than left lower extremity weakness.  His hip flexors and knee extensors are not antigravity, his dorsiflexion is approximately 2-3 out of 5, plantarflexion is approximately 2-3 out of 5.  On the left he shows at least 4 out of 5 in hip flexion and knee extension.  His dorsiflexion and plantarflexion are 4+.  He does have decreased sensation predominantly in the right lower extremity when compared to the left especially distal to the knee.  Data:  Recent Labs  Lab 03/23/23 1614 03/24/23 0609  NA 131* 133*  K 3.3* 3.2*  CL 99 103  CO2 20* 21*  BUN 17 13  CREATININE 0.91 0.59*  GLUCOSE 126* 98  CALCIUM 9.4 8.5*   Recent Labs  Lab 03/24/23 0609  AST 37  ALT 44  ALKPHOS 78     Recent Labs  Lab 03/23/23 1614 03/24/23 0609  WBC 16.6* 9.9  HGB 14.5 12.1*  HCT 42.5 35.9*  PLT 263 194   Recent Labs  Lab 03/23/23 1614  APTT 35  INR 1.3*         Other tests/results:  Narrative & Impression  CLINICAL DATA:  Initial evaluation for acute low back pain, infection.   EXAM: MRI LUMBAR SPINE WITHOUT AND WITH CONTRAST   TECHNIQUE: Multiplanar and multiecho pulse sequences of the lumbar spine were obtained without and with intravenous contrast.   CONTRAST:  10mL GADAVIST GADOBUTROL 1 MMOL/ML IV SOLN   COMPARISON:  Prior CT from earlier the same day.    FINDINGS: Segmentation: Standard. Lowest well-formed disc space labeled the L5-S1 level.   Alignment: 3 mm retrolisthesis of L2 on L3. Straightening of the normal lumbar lordosis elsewhere.   Vertebrae: Changes compatible with acute osteomyelitis discitis seen about the L2-3 interspace. Associated disc space height loss with endplate irregularity and erosion. Associated pathologic fractures with vertebral body height loss, better characterized on prior CT. Epidural extension with epidural phlegmon/abscess within the ventral epidural space extending from L1-2 through L3-4 (series 15, image 10). This measures up to 9 mm in maximal AP diameter at the level of L2-3 (series 16, image 20). Inflammatory changes within the paraspinous soft tissues and bilateral psoas muscles. Superimposed soft tissue abscesses seen about both psoas muscles, largest of which seen on the left and measures 3.0 x 3.4 cm (series 16, image 29). Largest collection on the right measures 1.4 x 1.2 cm (series 16, image 23).   Marrow edema and enhancement extends to partially involve the bilateral L2-3 facets, also likely involved. Associated edema and enhancement throughout the adjacent posterior paraspinous soft tissues. No posterior paraspinous soft tissue collections.   Additional mild chronic height loss noted at the superior endplate of L4. Vertebral body height otherwise maintained. Underlying bone marrow signal intensity within normal limits. No worrisome osseous lesions.   Conus medullaris  and cauda equina: Conus extends to the L1-2 level. Conus and cauda equina appear normal.   Paraspinal and other soft tissues: Paraspinous inflammatory changes as above. Few scattered T2 hyperintense cyst noted about the visualized kidneys, benign in appearance, no follow-up imaging recommended.   Disc levels:   T12-L1: Unremarkable.   L1-2: Negative interspace. Minimal facet spurring. No canal or foraminal  stenosis.   L2-3: Changes consistent with osteomyelitis discitis as above. Ventral epidural phlegmon/abscess measures up to 9 mm in AP diameter. Superimposed facet hypertrophy. Resultant moderate to severe canal with bilateral subarticular stenosis. Moderate bilateral L2 foraminal narrowing.   L3-4: Disc desiccation with diffuse disc bulge. Reactive endplate change with marginal endplate osteophytic spurring. Mild facet hypertrophy. No significant spinal stenosis. Foramina remain patent.   L4-5: Negative interspace. Mild facet spurring. No spinal stenosis. Mild bilateral L4 foraminal narrowing.   L5-S1: Negative interspace. Mild facet spurring. No canal or foraminal stenosis.   IMPRESSION: 1. Changes consistent with acute osteomyelitis discitis at L2-3. Associated ventral epidural phlegmon/abscess with resultant moderate to severe canal and bilateral subarticular stenosis, with moderate bilateral L2 foraminal narrowing. 2. Associated paraspinous inflammatory changes with bilateral psoas muscle abscesses, measuring up to 3.4 cm on the left and 1.4 cm on the right. 3. Underlying mild spondylosis as above. No other significant stenosis or impingement.     Electronically Signed   By: Rise Mu M.D.   On: 03/23/2023 21:04      Assessment/Plan:  Adam Cabrera is a 37 year old man with a history of previous IV drug use.  Over the past 2 to 3 weeks has had progressively worsened back pain and inability to ambulate.  He is experienced weakness and numbness in his right lower extremity.  He presented to the emergency department for further evaluation was found to have a discitis osteomyelitis at L2-3 with extension into the epidural space causing severe canal compromise.  With this canal compromise/compression and his weakness and numbness in his right lower extremity with decreased reflexes he is demonstrated significant neurologic compression.  Will plan for laminectomy at  L2-3 for decompression evacuation and sampling for further antibiotic planning.  Risks and benefits were discussed with the patient.  He would like to go forward with surgery.  Lovenia Kim, MD/MSCR Department of Neurosurgery

## 2023-03-24 NOTE — Progress Notes (Signed)
  Progress Note   Patient: Adam Cabrera UVO:[id number] DOB: 01/07/1986 DOA: 03/23/2023     1 DOS: the patient was seen and examined on 03/24/2023   Brief hospital course:  Adam Cabrera is a 37 y.o. male with medical history significant for hypertension, scrotal cellulitis, history of IV drug use, presents with low back pain.  MRI of the lumbar spine showed Epidural extension with epidural phlegmon/abscess within the ventral epidural space extending from L1-2 through L3-4.  Blood culture positive for Staph aureus, patient treated with cefepime and vancomycin.  Patient has been seen by ID and neurosurgery, L spine washout performed 8/19.      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 MSSA septicemia. Acute back pain with sciatica Patient met sepsis criteria at time of admission with heart rate 144, respiratory rate 26, this is secondary to epidural abscess. Patient has been seen by neurosurgery, epidural washout performed today. Consult from ID is obtained.  Most likely will change to cefazoli   Obesity (BMI 30-39.9) Diet and exercise.   Hypertension Continue to monitor.     Subjective:  Patient continued to have a lower back pain.  No shortness of breath.  Physical Exam: Vitals:   03/24/23 1000 03/24/23 1100 03/24/23 1200 03/24/23 1300  BP: 130/89 (!) 122/91 (!) 118/101 120/84  Pulse: 100 100 100 (!) 104  Resp: (!) 23 18 20 15   Temp:    (!) 97.4 F (36.3 C)  TempSrc:      SpO2: 94% 95% 99% 100%  Weight:    129.3 kg  Height:    6\' 2"  (1.88 m)   General exam: Appears calm and comfortable  Respiratory system: Clear to auscultation. Respiratory effort normal. Cardiovascular system: S1 & S2 heard, RRR. No JVD, murmurs, rubs, gallops or clicks. No pedal  edema. Gastrointestinal system: Abdomen is nondistended, soft and nontender. No organomegaly or masses felt. Normal bowel sounds heard. Central nervous system: Alert and oriented. No focal neurological deficits. Extremities: Symmetric 5 x 5 power. Skin: No rashes, lesions or ulcers Psychiatry: Judgement and insight appear normal. Mood & affect appropriate.    Data Reviewed:  MRI and lab results reviewed. Family Communication: None  Disposition: Status is: Inpatient Remains inpatient appropriate because: Severity of disease, IV treatment.     Time spent: 35 minutes  Author: Marrion Coy, MD 03/24/2023 3:32 PM  For on call review [email or web address].

## 2023-03-24 NOTE — ED Notes (Signed)
Pt is unable to wear a brace due to pain

## 2023-03-24 NOTE — Progress Notes (Signed)
Transferred to Pre-Op with this RN and Laveda Norman, orderly. Nichelle, RN now at bedside. Encouragement offered to patient.

## 2023-03-24 NOTE — Transfer of Care (Signed)
Immediate Anesthesia Transfer of Care Note  Patient: Lysle Dingwall  Procedure(s) Performed: LUMBAR LAMINECTOMY L2-3 FOR ABSCESS  Patient Location: PACU  Anesthesia Type:General  Level of Consciousness: drowsy  Airway & Oxygen Therapy: Patient Spontanous Breathing and Patient connected to face mask oxygen  Post-op Assessment: Report given to RN and Post -op Vital signs reviewed and stable  Post vital signs: Reviewed and stable  Last Vitals:  Vitals Value Taken Time  BP 113/71 03/24/23 1639  Temp    Pulse 101 03/24/23 1639  Resp 24 03/24/23 1639  SpO2 99 % 03/24/23 1639    Last Pain:  Vitals:   03/24/23 1300  TempSrc:   PainSc: 5       Patients Stated Pain Goal: 0 (03/24/23 0865)  Complications: There were no known notable events for this encounter.

## 2023-03-24 NOTE — Assessment & Plan Note (Addendum)
Vitals:   03/23/23 1433 03/23/23 1636 03/23/23 1700 03/23/23 1730  BP: (!) 135/109 (!) 143/91 (!) 151/98 (!) 147/99   03/23/23 1930 03/23/23 2045 03/23/23 2130 03/23/23 2330  BP: (!) 146/92 131/88 (!) 137/92 (!) 126/93   03/24/23 0230  BP: (!) 134/90  Elevated BP without diagnosis, 2/2 to pain. Pt has risk factor for HTN. Monitor and treat with PRN meds as needed.

## 2023-03-24 NOTE — Progress Notes (Signed)
Discussed patients pain with Dr. Chipper Herb. Patient was just transferred from stretcher to bed and then rolled in bed to get him situated during admission and is now in a lot of pain. RN unable to give PRN morphine until 0954 per PRN schedule. MD gave order to give a dose of 2mg  IV morphine now and to retime next dose based off q4H PRN order.

## 2023-03-24 NOTE — Op Note (Signed)
Indications:  Lumbar discitis Sepsis without acute organ dysfunction, due to unspecified organism [facility]) Epidural Abscess  Findings: Ventral phlegmon noted, semi-liquid abscess noted at the L2-3 Disk Space  Preoperative Diagnosis: Lumbar Radiculopathy Postoperative Diagnosis: same   EBL: 500 ml IVF: see anesthesia record Drains: Hemovac Disposition: Extubated and Stable to PACU Complications: none  No foley catheter was placed.   Preoperative Note: 37 year old man with a history of IV drug abuse.  Had a history of a recent spinal trauma with TP fractures.  Was admitted with sepsis and multifocal abscesses.  Was found to have discitis osteomyelitis at L2-3.  Had a epidural phlegmon that was compressive causing weakness and numbness in the right lower extremity predominantly.  Given his active infection with progressive weakness in his right lower extremity and left lower extremity he was taken to the OR for laminectomy and washout.  Risks of surgery discussed include: infection, bleeding, stroke, coma, death, paralysis, CSF leak, nerve/spinal cord injury, numbness, tingling, weakness, complex regional pain syndrome, recurrent stenosis and/or disc herniation, vascular injury, development of instability, neck/back pain, need for further surgery, persistent symptoms, development of deformity, and the risks of anesthesia. They understood these risks and have agreed to proceed.  Operative Note:    The patient was then brought from the intensive care unit with intravenous access established.  The patient underwent general anesthesia and endotracheal tube intubation, then was rotated on the Wilson frame where all pressure points were appropriately padded.  An incision was marked with flouroscopy. The skin was then thoroughly cleansed.  Perioperative antibiotic prophylaxis was administered.  Sterile prep and drapes were then applied and a timeout was then observed.    Once this was complete a 7 cm  incision was opened with the use of a #10 blade knife.  The paraspinus muscled on the were dissected off of the posterior elements at L2-3.  This was done in the subperiosteal fashion.  This brought laterally to the level of the inferior lamina and to the point where he could palpate the lateral aspect of the pars.   Once we were able to do this self-retaining retractors were placed.  This gave [place] good visualization.  Hemostasis was performed.  We were able to identify the L2 lamina and L3 lamina on once we verified this with fluoroscopy.  A Leksell rongeur was utilized to take off the posterior spinous processes at L2 and the superior portion of L3.  This brought down to the level of the lamina.  Once we are able to identify this we brought a high-speed drill into place.  Utilizing high-speed drill we performed all laminectomy starting from inferior to superior on the L2 lamina.  We brought this out to the level of the ligamentum flavum.  We then extended the laminectomy up to the portion where the ligamentum flavum was inserting on the inferior/deep aspect of the lamina.  Once we are able to do this we then remove the ligamentum flavum en bloc with a Kerrison rongeur.  This was extended down to the level of the L3 lamina superiorly.  High-speed drill was utilized to remove the bone to the level of the epidural space.  We then utilized a Kerrison rongeur to extend the laminectomy down further caudally.  At this point we were able to clearly see from distal to the L3 pedicle to the L2 pedicle.  We then retracted this medially we are able to feel the disc space.  The was no liquid access in the  epidural space this was all phlegmon.  It was debrided.  We then were able to enter the disc space at which we were able to take multiple pieces for microbiology.  We then obtained meticulous hemostasis.  We did multiple rounds of irrigation.  We then placed a subfascial drain.  Vancomycin powder was utilized disc space  as well as the subfascial and suprafascial space.  The wound was then closed in multiple layers.  A V-Loc suture was used on the superficial layer.  A sterile dressing was placed with a Prevena.  The Hemovac drain was sutured into place with nylon stitches.   Patient was then rotated back to the preoperative bed awakened from anesthesia and taken to recovery all counts are correct in this case.  Lovenia Kim, MD
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
## 2023-03-24 NOTE — Anesthesia Postprocedure Evaluation (Signed)
Anesthesia Post Note  Patient: Adam Cabrera  Procedure(s) Performed: LUMBAR LAMINECTOMY L2-3 FOR ABSCESS  Patient location during evaluation: PACU Anesthesia Type: General Level of consciousness: awake and alert Pain management: pain level controlled Vital Signs Assessment: post-procedure vital signs reviewed and stable Respiratory status: spontaneous breathing, nonlabored ventilation, respiratory function stable and patient connected to nasal cannula oxygen Cardiovascular status: blood pressure returned to baseline and stable Postop Assessment: no apparent nausea or vomiting Anesthetic complications: no   There were no known notable events for this encounter.   Last Vitals:  Vitals:   03/24/23 1700 03/24/23 1705  BP: (!) 112/96   Pulse: 97 96  Resp: 13 17  Temp:    SpO2: 93% 97%    Last Pain:  Vitals:   03/24/23 1720  TempSrc:   PainSc: 6                  Louie Boston

## 2023-03-24 NOTE — Anesthesia Procedure Notes (Signed)
Procedure Name: Intubation Date/Time: 03/24/2023 2:23 PM  Performed by: Lysbeth Penner, CRNAPre-anesthesia Checklist: Patient identified, Emergency Drugs available, Suction available and Patient being monitored Patient Re-evaluated:Patient Re-evaluated prior to induction Oxygen Delivery Method: Circle system utilized Preoxygenation: Pre-oxygenation with 100% oxygen Induction Type: IV induction Ventilation: Mask ventilation without difficulty Laryngoscope Size: McGraph and 4 Grade View: Grade I Tube type: Oral Tube size: 7.0 mm Number of attempts: 1 Airway Equipment and Method: Stylet and Oral airway Placement Confirmation: ETT inserted through vocal cords under direct vision, positive ETCO2 and breath sounds checked- equal and bilateral Secured at: 23 cm Tube secured with: Tape Dental Injury: Teeth and Oropharynx as per pre-operative assessment

## 2023-03-24 NOTE — Assessment & Plan Note (Deleted)
Outpatient ENT referral for sinusitis and cyst.

## 2023-03-24 NOTE — Anesthesia Preprocedure Evaluation (Addendum)
Anesthesia Evaluation  Patient identified by MRN, date of birth, ID band Patient awake    Reviewed: Allergy & Precautions, H&P , NPO status , Patient's Chart, lab work & pertinent test results  Airway Mallampati: II  TM Distance: >3 FB Neck ROM: full    Dental  (+) Missing, Poor Dentition, Chipped   Pulmonary Current Smoker and Patient abstained from smoking.   Pulmonary exam normal        Cardiovascular negative cardio ROS  Rhythm:Regular Rate:Tachycardia     Neuro/Psych negative neurological ROS  negative psych ROS   GI/Hepatic negative GI ROS, Neg liver ROS,,,  Endo/Other  negative endocrine ROS    Renal/GU      Musculoskeletal   Abdominal  (+) + obese  Peds  Hematology  (+) Blood dyscrasia, anemia   Anesthesia Other Findings Acute back pain with sciatica found to have lumbar abscess. He has had worsening lower extremity weakness numbness and ability to ambulate over the past 2 to 3 weeks. Pt reports IV heroin use a month and a half ago with no recent substance use since then.   Hypokalemia Anemia  BMI    Body Mass Index: 36.59 kg/m      Reproductive/Obstetrics negative OB ROS                              Anesthesia Physical Anesthesia Plan  ASA: 3  Anesthesia Plan: General ETT   Post-op Pain Management: Ofirmev IV (intra-op)*, Toradol IV (intra-op)* and Ketamine IV*   Induction: Intravenous  PONV Risk Score and Plan: 2 and Ondansetron, Dexamethasone and Midazolam  Airway Management Planned: Oral ETT  Additional Equipment:   Intra-op Plan:   Post-operative Plan: Extubation in OR  Informed Consent: I have reviewed the patients History and Physical, chart, labs and discussed the procedure including the risks, benefits and alternatives for the proposed anesthesia with the patient or authorized representative who has indicated his/her understanding and acceptance.      Dental Advisory Given  Plan Discussed with: CRNA and Surgeon  Anesthesia Plan Comments:          Anesthesia Quick Evaluation

## 2023-03-24 NOTE — ED Notes (Addendum)
Report given to Brittney RN at this time.

## 2023-03-24 NOTE — Interval H&P Note (Signed)
History and Physical Interval Note:  03/24/2023 1:53 PM  Adam Cabrera  has presented today for surgery, with the diagnosis of lumbar abscess.  The various methods of treatment have been discussed with the patient and family. After consideration of risks, benefits and other options for treatment, the patient has consented to  Procedure(s): LUMBAR LAMINECTOMY L2-3 FOR ABSCESS (N/A) as a surgical intervention.  The patient's history has been reviewed, patient examined, no change in status, stable for surgery.  I have reviewed the patient's chart and labs.  Questions were answered to the patient's satisfaction.     Lovenia Kim

## 2023-03-25 ENCOUNTER — Inpatient Hospital Stay (HOSPITAL_COMMUNITY)
Admit: 2023-03-25 | Discharge: 2023-03-25 | Disposition: A | Discharge status: Home / Self Care | Payer: Medicaid Other | Attending: Neurosurgery | Admitting: Neurosurgery

## 2023-03-25 ENCOUNTER — Inpatient Hospital Stay
Admit: 2023-03-25 | Discharge: 2023-03-25 | Disposition: A | Discharge status: Home / Self Care | Payer: Medicaid Other | Attending: Neurosurgery | Admitting: Neurosurgery

## 2023-03-25 ENCOUNTER — Inpatient Hospital Stay: Admit: 2023-03-25 | Payer: Medicaid Other

## 2023-03-25 ENCOUNTER — Encounter: Payer: Self-pay | Admitting: Neurosurgery

## 2023-03-25 DIAGNOSIS — R7881 Bacteremia: Secondary | ICD-10-CM | POA: Diagnosis not present

## 2023-03-25 DIAGNOSIS — K6812 Psoas muscle abscess: Secondary | ICD-10-CM | POA: Diagnosis not present

## 2023-03-25 DIAGNOSIS — G061 Intraspinal abscess and granuloma: Secondary | ICD-10-CM | POA: Diagnosis not present

## 2023-03-25 DIAGNOSIS — A4101 Sepsis due to Methicillin susceptible Staphylococcus aureus: Secondary | ICD-10-CM | POA: Diagnosis not present

## 2023-03-25 DIAGNOSIS — M4646 Discitis, unspecified, lumbar region: Secondary | ICD-10-CM | POA: Diagnosis not present

## 2023-03-25 DIAGNOSIS — M5440 Lumbago with sciatica, unspecified side: Secondary | ICD-10-CM | POA: Diagnosis not present

## 2023-03-25 LAB — ECHOCARDIOGRAM COMPLETE
AR max vel: 3.44 cm2
AV Area VTI: 3.6 cm2
AV Area mean vel: 3.06 cm2
AV Mean grad: 4 mmHg
AV Peak grad: 6 mmHg
Ao pk vel: 1.22 m/s
Area-P 1/2: 5.7 cm2
Height: 74 in
S' Lateral: 2.6 cm
Weight: 4560.88 [oz_av]

## 2023-03-25 LAB — BASIC METABOLIC PANEL
Anion gap: 5 (ref 5–15)
BUN: 10 mg/dL (ref 6–20)
CO2: 21 mmol/L — ABNORMAL LOW (ref 22–32)
Calcium: 8.1 mg/dL — ABNORMAL LOW (ref 8.9–10.3)
Chloride: 102 mmol/L (ref 98–111)
Creatinine, Ser: 0.53 mg/dL — ABNORMAL LOW (ref 0.61–1.24)
GFR, Estimated: >60 mL/min (ref >60)
Glucose, Bld: 114 mg/dL — ABNORMAL HIGH (ref 70–99)
Potassium: 3.6 mmol/L (ref 3.5–5.1)
Sodium: 128 mmol/L — ABNORMAL LOW (ref 135–145)

## 2023-03-25 LAB — MAGNESIUM: Magnesium: 2.1 mg/dL (ref 1.7–2.4)

## 2023-03-25 MED ORDER — HYDROMORPHONE HCL 1 MG/ML IJ SOLN
0.5000 mg | INTRAMUSCULAR | Status: AC | PRN
  Administered 2023-03-25 – 2023-03-26 (×4): 0.5 mg via INTRAVENOUS
  Filled 2023-03-25 (×4): qty 1

## 2023-03-25 MED ORDER — SODIUM CHLORIDE 0.9 % IV SOLN
6.0000 g | Freq: Two times a day (BID) | INTRAVENOUS | Status: DC
  Administered 2023-03-25 – 2023-03-26 (×2): 6 g via INTRAVENOUS
  Filled 2023-03-25 (×4): qty 24

## 2023-03-25 MED ORDER — SODIUM CHLORIDE 0.9 % IV SOLN
2.0000 g | Freq: Once | INTRAVENOUS | Status: AC
  Administered 2023-03-25: 2 g via INTRAVENOUS
  Filled 2023-03-25: qty 8

## 2023-03-25 NOTE — Progress Notes (Signed)
PT Cancellation Note  Patient Details Name: Adam Cabrera MRN: [id number] DOB: 1986/01/18   Cancelled Treatment:    Reason Eval/Treat Not Completed: Patient at procedure or test/unavailable (Chart reviewed, evaluation attempted. Pt having in-room echocardiogram done. Will attempt evaluation again at later date/time.)  10:56 AM, 03/25/23 [name], PT, DPT Physical Therapist - [facility]  [phone] (ASCOM)   [name] C 03/25/2023, 10:55 AM

## 2023-03-25 NOTE — Plan of Care (Signed)

## 2023-03-25 NOTE — Progress Notes (Signed)
*  PRELIMINARY RESULTS* Echocardiogram 2D Echocardiogram has been performed.  Cristela Blue 03/25/2023, 11:24 AM

## 2023-03-25 NOTE — Progress Notes (Signed)
Date of Admission:  03/23/2023      ID: JAKSYN FESER is a 37 y.o. 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 Active Problems:   Acute back pain with sciatica   Hypertension   Obesity (BMI 30-39.9)   Mucous retention cyst of maxillary sinus   MSSA (methicillin susceptible Staphylococcus aureus) septicemia (HCC)  Underwent laminectomy and debridement yesterday  Subjective: Pain legs and back No fever  Medications:   Chlorhexidine Gluconate Cloth  6 each Topical Daily   mupirocin ointment  1 Application Nasal BID   nicotine  21 mg Transdermal Daily   sodium chloride flush  3 mL Intravenous Q12H    Objective: Vital signs in last 24 hours: Patient Vitals for the past 24 hrs:  BP Temp Temp src Pulse Resp SpO2  03/25/23 1700 (!) 137/92 -- -- (!) 102 19 95 %  03/25/23 1600 137/87 -- -- (!) 102 (!) 26 94 %  03/25/23 1500 (!) 143/86 -- -- (!) 102 20 94 %  03/25/23 1400 127/81 98.2 F (36.8 C) Oral (!) 104 18 97 %  03/25/23 1300 136/81 -- -- 99 (!) 21 93 %  03/25/23 1200 135/83 97.7 F (36.5 C) Oral 100 20 96 %  03/25/23 1100 135/85 -- -- (!) 103 20 93 %  03/25/23 1000 138/83 -- -- (!) 110 (!) 25 94 %  03/25/23 0900 (!) 133/93 -- -- 97 16 96 %  03/25/23 0800 (!) 143/83 98 F (36.7 C) Oral 93 20 97 %  03/25/23 0701 (!) 136/90 -- -- 95 19 94 %  03/25/23 0600 (!) 145/82 -- -- 91 16 96 %  03/25/23 0500 132/87 -- -- 96 18 95 %  03/25/23 0400 133/83 98.4 F (36.9 C) Oral 99 (!) 28 92 %  03/25/23 0300 (!) 139/95 -- -- 94 17 91 %  03/25/23 0200 118/77 -- -- 97 (!) 30 93 %  03/25/23 0100 125/78 -- -- 98 16 92 %  03/25/23 0030 -- -- -- (!) 104 17 93 %  03/25/23 0000 124/82 98.2 F (36.8 C) Oral (!) 104 (!) 27 93 %  03/24/23 2300 131/89 -- -- (!) 108 (!) 23 97 %  03/24/23 2200 120/83 -- -- 100 12 95 %  03/24/23 2100 117/79 -- -- (!) 104 16 95 %  03/24/23 2000 (!) 134/93 98.4 F (36.9 C) Oral 98 15 95 %  03/24/23 1914 -- -- -- 92 14 96 %  03/24/23 1900 -- -- -- 95 19 91  %  03/24/23 1823 139/83 97.6 F (36.4 C) Oral 98 17 95 %  03/24/23 1820 -- -- -- 94 16 94 %  03/24/23 1815 139/86 -- -- 96 (!) 6 95 %  03/24/23 1810 -- -- -- (!) 101 17 93 %  03/24/23 1805 -- -- -- 95 17 92 %  03/24/23 1800 133/83 -- -- (!) 101 15 92 %      PHYSICAL EXAM:  General: Alert, cooperative, some distress,  Head: Normocephalic, without obvious abnormality, atraumatic. Eyes: Conjunctivae clear, anicteric sclerae. Pupils are equal ENT Nares normal. No drainage or sinus tenderness. Lips, mucosa, and tongue normal. No Thrush Neck: Supple, symmetrical, no adenopathy, thyroid: non tender no carotid bruit and no JVD. Back: No CVA tenderness. Lungs: Clear to auscultation bilaterally. No Wheezing or Rhonchi. No rales. Heart: Regular rate and rhythm, no murmur, rub or gallop. Abdomen: Soft, non-tender,not distended. Bowel sounds normal. No masses Extremities:edema legs Venous staining both legs rt > left Skin:  No rashes or lesions. Or bruising Lymph: Cervical, supraclavicular normal. Neurologic: did not examine in detail  Lab Results    Latest Ref Rng & Units 03/24/2023    6:09 AM 03/23/2023    4:14 PM 02/12/2023    6:20 PM  CBC  WBC 4.0 - 10.5 K/uL 9.9  16.6  6.5   Hemoglobin 13.0 - 17.0 g/dL 16.1  09.6  04.5   Hematocrit 39.0 - 52.0 % 35.9  42.5  34.0   Platelets 150 - 400 K/uL 194  263  118        Latest Ref Rng & Units 03/25/2023    9:09 AM 03/24/2023    6:09 AM 03/23/2023    4:14 PM  CMP  Glucose 70 - 99 mg/dL 409  98  811   BUN 6 - 20 mg/dL 10  13  17    Creatinine 0.61 - 1.24 mg/dL 9.14  7.82  9.56   Sodium 135 - 145 mmol/L 128  133  131   Potassium 3.5 - 5.1 mmol/L 3.6  3.2  3.3   Chloride 98 - 111 mmol/L 102  103  99   CO2 22 - 32 mmol/L 21  21  20    Calcium 8.9 - 10.3 mg/dL 8.1  8.5  9.4   Total Protein 6.5 - 8.1 g/dL  8.1  9.5   Total Bilirubin 0.3 - 1.2 mg/dL  1.5  3.1   Alkaline Phos 38 - 126 U/L  78  111   AST 15 - 41 U/L  37  56   ALT 0 - 44 U/L  44   55       Microbiology: 8/18 [facility]- MSSA 8/19 Surgical culture pending Studies/Results: ECHOCARDIOGRAM COMPLETE  Result Date: 03/25/2023    ECHOCARDIOGRAM REPORT   Patient Name:   ABSALON MANNIS Boerema Date of Exam: 03/25/2023 Medical Rec #:  [id number]     Height:       74.0 in Accession #:    [id number]    Weight:       285.1 lb Date of Birth:  [birth year]      BSA:          2.527 m Patient Age:    37 years      BP:           136/90 mmHg Patient Gender: M             HR:           95 bpm. Exam Location:  [facility] Procedure: 2D Echo, Color Doppler and Cardiac Doppler Indications:     Bacteremia R78.81  History:         Patient has no prior history of Echocardiogram examinations.                  Cellulitis.  Sonographer:     Cristela Blue Referring Phys:  [id number] Avie Echevaria KOCH Diagnosing Phys: Yvonne Kendall MD  Sonographer Comments: Suboptimal parasternal window. IMPRESSIONS  1. Left ventricular ejection fraction, by estimation, is 65 to 70%. The left ventricle has normal function. Left ventricular endocardial border not optimally defined to evaluate regional wall motion. Indeterminate diastolic filling due to E-A fusion.  2. Right ventricular systolic function is normal. The right ventricular size is normal. Tricuspid regurgitation signal is inadequate for assessing PA pressure.  3. The mitral valve is normal in structure. No evidence of mitral valve regurgitation.  4. The aortic valve was not well visualized. Aortic valve regurgitation is  not visualized. No aortic stenosis is present.  5. The inferior vena cava is normal in size with greater than 50% respiratory variability, suggesting right atrial pressure of 3 mmHg. FINDINGS  Left Ventricle: Left ventricular ejection fraction, by estimation, is 65 to 70%. The left ventricle has normal function. Left ventricular endocardial border not optimally defined to evaluate regional wall motion. The left ventricular internal cavity size was normal in size. There is no left  ventricular hypertrophy. Indeterminate diastolic filling due to E-A fusion. Right Ventricle: The right ventricular size is normal. No increase in right ventricular wall thickness. Right ventricular systolic function is normal. Tricuspid regurgitation signal is inadequate for assessing PA pressure. Left Atrium: Left atrial size was normal in size. Right Atrium: Right atrial size was normal in size. Pericardium: There is no evidence of pericardial effusion. Mitral Valve: The mitral valve is normal in structure. No evidence of mitral valve regurgitation. Tricuspid Valve: The tricuspid valve is not well visualized. Tricuspid valve regurgitation is trivial. Aortic Valve: The aortic valve was not well visualized. Aortic valve regurgitation is not visualized. No aortic stenosis is present. Aortic valve mean gradient measures 4.0 mmHg. Aortic valve peak gradient measures 6.0 mmHg. Aortic valve area, by VTI measures 3.60 cm. Pulmonic Valve: The pulmonic valve was not well visualized. Pulmonic valve regurgitation is trivial. No evidence of pulmonic stenosis. Aorta: The aortic root is normal in size and structure. Pulmonary Artery: The pulmonary artery is of normal size. Venous: The inferior vena cava is normal in size with greater than 50% respiratory variability, suggesting right atrial pressure of 3 mmHg. IAS/Shunts: The interatrial septum was not well visualized.  LEFT VENTRICLE PLAX 2D LVIDd:         4.40 cm   Diastology LVIDs:         2.60 cm   LV e' medial:    6.31 cm/s LV PW:         1.09 cm   LV E/e' medial:  11.2 LV IVS:        0.92 cm   LV e' lateral:   8.81 cm/s LVOT diam:     2.30 cm   LV E/e' lateral: 8.0 LV SV:         62 LV SV Index:   25 LVOT Area:     4.15 cm  RIGHT VENTRICLE RV Basal diam:  3.90 cm RV Mid diam:    3.80 cm RV S prime:     22.00 cm/s TAPSE (M-mode): 3.6 cm LEFT ATRIUM             Index        RIGHT ATRIUM           Index LA diam:        3.50 cm 1.39 cm/m   RA Area:     12.80 cm LA Vol  (A2C):   30.7 ml 12.15 ml/m  RA Volume:   26.20 ml  10.37 ml/m LA Vol (A4C):   33.8 ml 13.38 ml/m LA Biplane Vol: 32.4 ml 12.82 ml/m  AORTIC VALVE AV Area (Vmax):    3.44 cm AV Area (Vmean):   3.06 cm AV Area (VTI):     3.60 cm AV Vmax:           122.00 cm/s AV Vmean:          92.700 cm/s AV VTI:            0.173 m AV Peak Grad:  6.0 mmHg AV Mean Grad:      4.0 mmHg LVOT Vmax:         101.00 cm/s LVOT Vmean:        68.200 cm/s LVOT VTI:          0.150 m LVOT/AV VTI ratio: 0.87  AORTA Ao Root diam: 3.30 cm MITRAL VALVE MV Area (PHT): 5.70 cm     SHUNTS MV Decel Time: 133 msec     Systemic VTI:  0.15 m MV E velocity: 70.70 cm/s   Systemic Diam: 2.30 cm MV A velocity: 110.00 cm/s MV E/A ratio:  0.64 Cristal [place] MD Electronically signed by Yvonne Kendall MD Signature Date/Time: 03/25/2023/3:38:02 PM    Final    DG Lumbar Spine 1 View  Result Date: 03/24/2023 CLINICAL DATA:  Elective surgery. L2-L3 laminectomy for epidural abscess. EXAM: LUMBAR SPINE - 1 VIEW COMPARISON:  Preoperative imaging FINDINGS: Single lateral fluoroscopic spot view of the lumbar spine obtained in the operating room. Surgical instrument localizes at the L3 level. Fluoroscopy time 7 seconds. Dose 6.0953 mGy. IMPRESSION: Intraoperative fluoroscopy during lumbar spine surgery. Electronically Signed   By: Narda Rutherford M.D.   On: 03/24/2023 19:04   DG C-Arm 1-60 Min-No Report  Result Date: 03/24/2023 Fluoroscopy was utilized by the requesting physician.  No radiographic interpretation.   DG C-Arm 1-60 Min-No Report  Result Date: 03/24/2023 Fluoroscopy was utilized by the requesting physician.  No radiographic interpretation.   MR Lumbar Spine W Wo Contrast  Result Date: 03/23/2023 CLINICAL DATA:  Initial evaluation for acute low back pain, infection. EXAM: MRI LUMBAR SPINE WITHOUT AND WITH CONTRAST TECHNIQUE: Multiplanar and multiecho pulse sequences of the lumbar spine were obtained without and with intravenous  contrast. CONTRAST:  10mL GADAVIST GADOBUTROL 1 MMOL/ML IV SOLN COMPARISON:  Prior CT from earlier the same day. FINDINGS: Segmentation: Standard. Lowest well-formed disc space labeled the L5-S1 level. Alignment: 3 mm retrolisthesis of L2 on L3. Straightening of the normal lumbar lordosis elsewhere. Vertebrae: Changes compatible with acute osteomyelitis discitis seen about the L2-3 interspace. Associated disc space height loss with endplate irregularity and erosion. Associated pathologic fractures with vertebral body height loss, better characterized on prior CT. Epidural extension with epidural phlegmon/abscess within the ventral epidural space extending from L1-2 through L3-4 (series 15, image 10). This measures up to 9 mm in maximal AP diameter at the level of L2-3 (series 16, image 20). Inflammatory changes within the paraspinous soft tissues and bilateral psoas muscles. Superimposed soft tissue abscesses seen about both psoas muscles, largest of which seen on the left and measures 3.0 x 3.4 cm (series 16, image 29). Largest collection on the right measures 1.4 x 1.2 cm (series 16, image 23). Marrow edema and enhancement extends to partially involve the bilateral L2-3 facets, also likely involved. Associated edema and enhancement throughout the adjacent posterior paraspinous soft tissues. No posterior paraspinous soft tissue collections. Additional mild chronic height loss noted at the superior endplate of L4. Vertebral body height otherwise maintained. Underlying bone marrow signal intensity within normal limits. No worrisome osseous lesions. Conus medullaris and cauda equina: Conus extends to the L1-2 level. Conus and cauda equina appear normal. Paraspinal and other soft tissues: Paraspinous inflammatory changes as above. Few scattered T2 hyperintense cyst noted about the visualized kidneys, benign in appearance, no follow-up imaging recommended. Disc levels: T12-L1: Unremarkable. L1-2: Negative interspace.  Minimal facet spurring. No canal or foraminal stenosis. L2-3: Changes consistent with osteomyelitis discitis as above. Ventral epidural phlegmon/abscess measures up  to 9 mm in AP diameter. Superimposed facet hypertrophy. Resultant moderate to severe canal with bilateral subarticular stenosis. Moderate bilateral L2 foraminal narrowing. L3-4: Disc desiccation with diffuse disc bulge. Reactive endplate change with marginal endplate osteophytic spurring. Mild facet hypertrophy. No significant spinal stenosis. Foramina remain patent. L4-5: Negative interspace. Mild facet spurring. No spinal stenosis. Mild bilateral L4 foraminal narrowing. L5-S1: Negative interspace. Mild facet spurring. No canal or foraminal stenosis. IMPRESSION: 1. Changes consistent with acute osteomyelitis discitis at L2-3. Associated ventral epidural phlegmon/abscess with resultant moderate to severe canal and bilateral subarticular stenosis, with moderate bilateral L2 foraminal narrowing. 2. Associated paraspinous inflammatory changes with bilateral psoas muscle abscesses, measuring up to 3.4 cm on the left and 1.4 cm on the right. 3. Underlying mild spondylosis as above. No other significant stenosis or impingement. Electronically Signed   By: Rise Mu M.D.   On: 03/23/2023 21:04     Assessment/Plan: MSSA bacteremia Discitis and osteomyleitis L2-L3 With epidural abscess/phlegmon at the same level S/p laminectomy and debridement Repeat blood culture Change cefazolin to nafcillin 2 d echo no vegetation TEE   B/l psoas abscesses , may consider IR drainage if needed     Chronic Hepaittis C- has not ben treated- will need management as OP   Splenomegaly likely due to portal hypertension likely cirrhosis   IVDA- cannot be sent home with PICC line and IV antibiotic   Discussed the management with the patient and care team ? ? _______________________________

## 2023-03-25 NOTE — Evaluation (Signed)
Occupational Therapy Evaluation Patient Details Name: Adam Cabrera MRN: [id number] DOB: 07-26-1986 Today's Date: 03/25/2023   History of Present Illness 37 y.o. male with medical history significant for hypertension, scrotal cellulitis, history of IV drug use, presents with low back pain.  MRI of the lumbar spine showed Epidural extension with epidural phlegmon/abscess within the ventral epidural space extending from L1-2 through L3-4 and s/p lumbar laminectomy   Clinical Impression   Patient presenting with decreased Ind in self care,balance, functional mobility/transfers, endurance, and safety awareness. Patient reports living at home with parents and being Ind at baseline. He does not currently work. Pt was very motivated and cooperative during session. Therapist reviewed precautions with pt and discussed in detail plan for session to decrease anxiousness. Log roll performed with max  A of 2 to EOB. Pt appears to have spasm like pain while seated EOB and pt reports numbness in B LEs once seated. TLSO in room but not donned during this session +2 assistance to return and pt cries out in pain with mobility tasks. He remains motivated and apologized to OT and PT. He is happy to attempt further self care and mobility task tomorrow. Pt is far from baseline. Patient will benefit from acute OT to increase overall independence in the areas of ADLs, functional mobility,and safety awareness in order to safely discharge.      If plan is discharge home, recommend the following: Two people to help with walking and/or transfers;Two people to help with bathing/dressing/bathroom;Assistance with cooking/housework;Assist for transportation;Help with stairs or ramp for entrance    Functional Status Assessment  Patient has had a recent decline in their functional status and demonstrates the ability to make significant improvements in function in a reasonable and predictable amount of time.  Equipment  Recommendations  Other (comment) (defer to next venue of care)       Precautions / Restrictions Precautions Precautions: Fall;Back Required Braces or Orthoses: Spinal Brace Spinal Brace: Thoracolumbosacral orthotic;Other (comment) Spinal Brace Comments: for comfort only to be donned EOB      Mobility Bed Mobility Overal bed mobility: Needs Assistance Bed Mobility: Supine to Sit, Sit to Supine, Rolling Rolling: +2 for physical assistance, Max assist   Supine to sit: Max assist, +2 for physical assistance Sit to supine: Total assist, +2 for physical assistance        Transfers                   General transfer comment: not attempted secondary to pain      Balance Overall balance assessment: Needs assistance Sitting-balance support: Feet supported, Bilateral upper extremity supported Sitting balance-Leahy Scale: Fair                                     ADL either performed or assessed with clinical judgement   ADL Overall ADL's : Needs assistance/impaired Eating/Feeding: Modified independent   Grooming: Set up;Bed level                                 General ADL Comments: unable to attempt transfer this session and pt would likely need heavy assistance for all self care tasks secondary to increased pain     Vision Patient Visual Report: No change from baseline              Pertinent Vitals/Pain Pain  Assessment Pain Assessment: Faces Faces Pain Scale: Hurts worst Pain Location: lower back Pain Descriptors / Indicators: Aching, Discomfort, Grimacing, Guarding, Spasm Pain Intervention(s): Limited activity within patient's tolerance, Repositioned, Monitored during session, Premedicated before session     Extremity/Trunk Assessment Upper Extremity Assessment Upper Extremity Assessment: Generalized weakness           Communication Communication Communication: No apparent difficulties Cueing Techniques: Verbal cues    Cognition Arousal: Alert Behavior During Therapy: Anxious Overall Cognitive Status: Within Functional Limits for tasks assessed                                                  Home Living Family/patient expects to be discharged to:: Private residence Living Arrangements: Parent Available Help at Discharge: Family;Available PRN/intermittently Type of Home: House Home Access: Stairs to enter [employer] of Steps: 6 Entrance Stairs-Rails: Right;Left Home Layout: One level     Bathroom Shower/Tub: Tub/shower unit         Home Equipment: None          Prior Functioning/Environment Prior Level of Function : Independent/Modified Independent                        OT Problem List: Decreased strength;Decreased activity tolerance;Decreased safety awareness;Impaired balance (sitting and/or standing);Decreased knowledge of use of DME or AE;Decreased range of motion;Pain;Impaired sensation;Decreased knowledge of precautions      OT Treatment/Interventions: Self-care/ADL training;Therapeutic exercise;Therapeutic activities;Energy conservation;DME and/or AE instruction;Patient/family education;Balance training    OT Goals(Current goals can be found in the care plan section) Acute Rehab OT Goals Patient Stated Goal: to decrease pain OT Goal Formulation: With patient Time For Goal Achievement: 04/08/23 Potential to Achieve Goals: Fair ADL Goals Pt Will Perform Grooming: with min assist;standing Pt Will Perform Lower Body Dressing: with min assist;sit to/from stand Pt Will Transfer to Toilet: with min assist;ambulating Pt Will Perform Toileting - Clothing Manipulation and hygiene: with min assist;sit to/from stand  OT Frequency: Min 1X/week    Co-evaluation PT/OT/SLP Co-Evaluation/Treatment: Yes Reason for Co-Treatment: Complexity of the patient's impairments (multi-system involvement);For patient/therapist safety;To address functional/ADL  transfers PT goals addressed during session: Mobility/safety with mobility OT goals addressed during session: ADL's and self-care      AM-PAC OT "6 Clicks" Daily Activity     Outcome Measure Help from another person eating meals?: None Help from another person taking care of personal grooming?: A Little Help from another person toileting, which includes using toliet, bedpan, or urinal?: Total Help from another person bathing (including washing, rinsing, drying)?: A Lot Help from another person to put on and taking off regular upper body clothing?: A Lot Help from another person to put on and taking off regular lower body clothing?: Total 6 Click Score: 13   End of Session Nurse Communication: Mobility status  Activity Tolerance: Patient limited by pain Patient left: in bed;with call bell/phone within reach;with bed alarm set;Other (comment) (MD present in room)  OT Visit Diagnosis: Unsteadiness on feet (R26.81);Repeated falls (R29.6);Muscle weakness (generalized) (M62.81)                Time: 8295-6213 OT Time Calculation (min): 17 min Charges:  OT General Charges $OT Visit: 1 Visit OT Evaluation $OT Eval Moderate Complexity: 1 [address] [place], OTR/L , CBIS ascom [phone]  03/25/23, 3:21 PM

## 2023-03-25 NOTE — Progress Notes (Signed)
  Progress Note   Patient: Adam Cabrera WGN:[id number] DOB: 1985/12/25 DOA: 03/23/2023     2 DOS: the patient was seen and examined on 03/25/2023   Brief hospital course:  Adam Cabrera is a 37 y.o. male with medical history significant for hypertension, scrotal cellulitis, history of IV drug use, presents with low back pain.  MRI of the lumbar spine showed Epidural extension with epidural phlegmon/abscess within the ventral epidural space extending from L1-2 through L3-4.  Blood culture positive for Staph aureus, patient treated with cefepime and vancomycin.  Patient has been seen by ID and neurosurgery, L spine washout performed 8/19.      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 MSSA septicemia. Acute back pain with sciatica Patient met sepsis criteria at time of admission with heart rate 144, respiratory rate 26, this is secondary to epidural abscess. Patient has been seen by neurosurgery, epidural washout performed 8/19. Consult from ID is obtained.  Antibiotic changed to cefazolin. Patient still have significant pain from surgical site today, continue pain medicine.  I will change morphine to dilaudid for now, alternating with hydrocodone.  Hypokalemia. Hyponatremia. Potassium has normalized, sodium level stable.  Continue to follow.   Obesity (BMI 30-39.9) Diet and exercise.     Hypertension Continue to monitor      Subjective:  Patient still has significant back pain with movement.  Physical Exam: Vitals:   03/25/23 1100 03/25/23 1200 03/25/23 1300 03/25/23 1400  BP: 135/85 135/83 136/81 127/81  Pulse: (!) 103 100 99 (!) 104  Resp: 20 20 (!) 21 18  Temp:  97.7 F (36.5 C)     TempSrc:  Oral    SpO2: 93% 96% 93% 97%  Weight:      Height:       General exam: Appears calm and comfortable  Respiratory system: Clear to auscultation. Respiratory effort normal. Cardiovascular system: S1 & S2 heard, RRR. No JVD, murmurs, rubs, gallops or clicks. No pedal edema. Gastrointestinal system: Abdomen is nondistended, soft and nontender. No organomegaly or masses felt. Normal bowel sounds heard. Central nervous system: Alert and oriented. No focal neurological deficits. Extremities: Symmetric 5 x 5 power. Skin: No rashes, lesions or ulcers Psychiatry: Judgement and insight appear normal. Mood & affect appropriate.    Data Reviewed:  Lab results reviewed.  Family Communication: None  Disposition: Status is: Inpatient Remains inpatient appropriate because: Severity of disease, IV treatment.     Time spent: 35 minutes  Author: Marrion Coy, MD 03/25/2023 2:58 PM  For on call review [email or web address].

## 2023-03-25 NOTE — Evaluation (Signed)
Physical Therapy Evaluation Patient Details Name: Adam Cabrera MRN: [id number] DOB: [birth year] Today's Date: 03/25/2023  History of Present Illness  37 y.o. male with medical history significant for hypertension, scrotal cellulitis, history of IV drug use, presents with low back pain.  MRI of the lumbar spine showed Epidural extension with epidural phlegmon/abscess within the ventral epidural space extending from L1-2 through L3-4 and s/p lumbar laminectomy  Clinical Impression   Pt is seen by OT and PT for co-evaluation today. Pt is received in bed, he is agreeable to PT session. Pt is anxious and limited by increase back pain during movement. Pt performs bed mobility max-total A x2 to sit EOB. Pt is motivated to improve health but heavily limited by back pain. Notified nursing staff of increased levels of pain for pain management and MD at bedside at end of session. Pt would benefit from skilled PT to address above deficits and promote optimal return to PLOF.         If plan is discharge home, recommend the following:     Can travel by private vehicle        Equipment Recommendations Rolling walker (2 wheels);BSC/3in1  Recommendations for Other Services       Functional Status Assessment Patient has had a recent decline in their functional status and demonstrates the ability to make significant improvements in function in a reasonable and predictable amount of time.     Precautions / Restrictions Precautions Precautions: Fall;Back Required Braces or Orthoses: Spinal Brace Spinal Brace: Thoracolumbosacral orthotic;Other (comment) Spinal Brace Comments: for comfort only to be donned EOB      Mobility  Bed Mobility Overal bed mobility: Needs Assistance Bed Mobility: Supine to Sit, Sit to Supine, Rolling Rolling: +2 for physical assistance, Max assist   Supine to sit: Max assist, +2 for physical assistance Sit to supine: Total assist, +2 for physical assistance   General bed  mobility comments: Limited by increased pain and required max-total A x2 for tasks Patient Response: Anxious, Cooperative  Transfers                   General transfer comment: NT secondary to pain    Ambulation/Gait               General Gait Details: NT secondary to pain  Stairs            Wheelchair Mobility     Tilt Bed Tilt Bed Patient Response: Anxious, Cooperative  Modified Rankin (Stroke Patients Only)       Balance Overall balance assessment: Needs assistance Sitting-balance support: Feet supported, Bilateral upper extremity supported Sitting balance-Leahy Scale: Fair Sitting balance - Comments: Requires BUE support on bed for seated balance but limited tolerance due to pain       Standing balance comment: NT secondary to pain                             Pertinent Vitals/Pain Pain Assessment Pain Assessment: Faces Faces Pain Scale: Hurts worst Pain Location: lower back Pain Descriptors / Indicators: Aching, Discomfort, Grimacing, Guarding, Spasm, Operative site guarding Pain Intervention(s): Limited activity within patient's tolerance, Repositioned, Monitored during session, Premedicated before session    Home Living Family/patient expects to be discharged to:: Private residence Living Arrangements: Parent Available Help at Discharge: Family;Available PRN/intermittently Type of Home: House Home Access: Stairs to enter Entrance Stairs-Rails: Right;Left Entrance Stairs-Number of Steps: 6   Home Layout: One  level Home Equipment: None      Prior Function Prior Level of Function : Independent/Modified Independent                     Extremity/Trunk Assessment   Upper Extremity Assessment Upper Extremity Assessment: Defer to OT evaluation    Lower Extremity Assessment Lower Extremity Assessment: Generalized weakness       Communication   Communication Communication: No apparent difficulties Cueing  Techniques: Verbal cues  Cognition Arousal: Alert Behavior During Therapy: Anxious Overall Cognitive Status: Within Functional Limits for tasks assessed                                 General Comments: Anxious regarding mobility due to pain but willing to attempt        General Comments      Exercises     Assessment/Plan    PT Assessment Patient needs continued PT services  PT Problem List Decreased strength;Decreased mobility;Decreased range of motion;Decreased activity tolerance;Decreased balance;Decreased knowledge of precautions;Pain       PT Treatment Interventions DME instruction;Therapeutic exercise;Stair training;Functional mobility training;Therapeutic activities;Gait training;Neuromuscular re-education    PT Goals (Current goals can be found in the Care Plan section)  Acute Rehab PT Goals Patient Stated Goal: to get better PT Goal Formulation: With patient Time For Goal Achievement: 04/08/23 Potential to Achieve Goals: Good    Frequency Min 1X/week     Co-evaluation   Reason for Co-Treatment: Complexity of the patient's impairments (multi-system involvement);For patient/therapist safety;To address functional/ADL transfers PT goals addressed during session: Mobility/safety with mobility OT goals addressed during session: ADL's and self-care       AM-PAC PT "6 Clicks" Mobility  Outcome Measure Help needed turning from your back to your side while in a flat bed without using bedrails?: A Lot Help needed moving from lying on your back to sitting on the side of a flat bed without using bedrails?: A Lot Help needed moving to and from a bed to a chair (including a wheelchair)?: A Lot Help needed standing up from a chair using your arms (e.g., wheelchair or bedside chair)?: A Lot Help needed to walk in hospital room?: Total Help needed climbing 3-5 steps with a railing? : Total 6 Click Score: 10    End of Session   Activity Tolerance: Patient  limited by pain Patient left: in bed;with call bell/phone within reach;with bed alarm set;Other (comment) (MD at bedside at end of session) Nurse Communication: Mobility status PT Visit Diagnosis: Unsteadiness on feet (R26.81);Pain;Muscle weakness (generalized) (M62.81);Difficulty in walking, not elsewhere classified (R26.2) Pain - part of body:  (back)    Time: 1610-9604 PT Time Calculation (min) (ACUTE ONLY): 15 min   Charges:                 Elmon Else, SPT   Vandora Jaskulski 03/25/2023, 5:01 PM

## 2023-03-25 NOTE — Progress Notes (Signed)
   Neurosurgery Progress Note  History: Adam Cabrera is s/p L2-3 decompression for epidural phlegmon.  POD1: improved leg pain and strength overnight.  Physical Exam: Vitals:   03/25/23 0600 03/25/23 0701  BP: (!) 145/82 (!) 136/90  Pulse: 91 95  Resp: 16 19  Temp:    SpO2: 96% 94%    AA Ox3 CNI  Strength:4/5 throughout BLE HV 15  Data:  Other tests/results:  Cultures pending. Gram stain showing gram positive cocci  Assessment/Plan:  Adam Cabrera is a 37 year old presenting with an L2-3 abscess and right greater than left lower extremity weakness status post L2-3 decompression for evacuation of epidural phlegmon.  - mobilize - pain control - DVT prophylaxis - PT - continue HV and incisional wound VAC  Manning Charity PA-C Department of Neurosurgery

## 2023-03-26 DIAGNOSIS — A4101 Sepsis due to Methicillin susceptible Staphylococcus aureus: Secondary | ICD-10-CM | POA: Diagnosis not present

## 2023-03-26 DIAGNOSIS — M5440 Lumbago with sciatica, unspecified side: Secondary | ICD-10-CM | POA: Diagnosis not present

## 2023-03-26 DIAGNOSIS — B9561 Methicillin susceptible Staphylococcus aureus infection as the cause of diseases classified elsewhere: Secondary | ICD-10-CM

## 2023-03-26 DIAGNOSIS — G061 Intraspinal abscess and granuloma: Secondary | ICD-10-CM | POA: Diagnosis not present

## 2023-03-26 DIAGNOSIS — K6812 Psoas muscle abscess: Secondary | ICD-10-CM | POA: Diagnosis not present

## 2023-03-26 DIAGNOSIS — M4646 Discitis, unspecified, lumbar region: Secondary | ICD-10-CM | POA: Diagnosis not present

## 2023-03-26 LAB — BASIC METABOLIC PANEL
Anion gap: 6 (ref 5–15)
BUN: 10 mg/dL (ref 6–20)
CO2: 25 mmol/L (ref 22–32)
Calcium: 8.1 mg/dL — ABNORMAL LOW (ref 8.9–10.3)
Chloride: 101 mmol/L (ref 98–111)
Creatinine, Ser: 0.46 mg/dL — ABNORMAL LOW (ref 0.61–1.24)
GFR, Estimated: >60 mL/min (ref >60)
Glucose, Bld: 104 mg/dL — ABNORMAL HIGH (ref 70–99)
Potassium: 2.9 mmol/L — ABNORMAL LOW (ref 3.5–5.1)
Sodium: 132 mmol/L — ABNORMAL LOW (ref 135–145)

## 2023-03-26 LAB — CULTURE, BLOOD (ROUTINE X 2)
Special Requests: ADEQUATE
Special Requests: ADEQUATE

## 2023-03-26 LAB — QUANTIFERON-TB GOLD PLUS (RQFGPL)
QuantiFERON Mitogen Value: 3.21 [IU]/mL
QuantiFERON Nil Value: 0 [IU]/mL
QuantiFERON TB1 Ag Value: 0 [IU]/mL
QuantiFERON TB2 Ag Value: 0 [IU]/mL

## 2023-03-26 LAB — MAGNESIUM: Magnesium: 1.8 mg/dL (ref 1.7–2.4)

## 2023-03-26 LAB — QUANTIFERON-TB GOLD PLUS: QuantiFERON-TB Gold Plus: NEGATIVE

## 2023-03-26 MED ORDER — POTASSIUM CHLORIDE CRYS ER 20 MEQ PO TBCR
40.0000 meq | EXTENDED_RELEASE_TABLET | ORAL | Status: AC
  Administered 2023-03-26 (×2): 40 meq via ORAL
  Filled 2023-03-26 (×2): qty 2

## 2023-03-26 MED ORDER — ENOXAPARIN SODIUM 60 MG/0.6ML IJ SOSY
60.0000 mg | PREFILLED_SYRINGE | INTRAMUSCULAR | Status: DC
  Administered 2023-03-26: 60 mg via SUBCUTANEOUS
  Filled 2023-03-26: qty 0.6

## 2023-03-26 MED ORDER — POTASSIUM CHLORIDE 10 MEQ/100ML IV SOLN
10.0000 meq | Freq: Once | INTRAVENOUS | Status: AC
  Administered 2023-03-26: 10 meq via INTRAVENOUS
  Filled 2023-03-26: qty 100

## 2023-03-26 MED ORDER — SODIUM CHLORIDE 0.9 % IV SOLN
12.0000 g | INTRAVENOUS | Status: AC
  Administered 2023-03-26 – 2023-04-21 (×27): 12 g via INTRAVENOUS
  Filled 2023-03-26 (×20): qty 48
  Filled 2023-03-26: qty 40
  Filled 2023-03-26 (×9): qty 48

## 2023-03-26 NOTE — Progress Notes (Signed)
Occupational Therapy Treatment Patient Details Name: Adam Cabrera MRN: [id number] DOB: 12-Jan-1986 Today's Date: 03/26/2023   History of present illness 37 y.o. male with medical history significant for hypertension, scrotal cellulitis, history of IV drug use, presents with low back pain.  MRI of the lumbar spine showed Epidural extension with epidural phlegmon/abscess within the ventral epidural space extending from L1-2 through L3-4 and s/p lumbar laminectomy   OT comments  Pt seen for skilled co-treatment with PT to address functional mobility secondary to limitations from pain. Pt is anxious but agreeable to mobility attempts. Total A to don B socks. Pt rolls and performs supine >sit with +2 assistance. Pt stands with mod A of 2 and comes to full height. Pt takes several side steps with min A of 2 towards the R to recliner chair. Pt sits with discomfort but agreeable to attempt to sit in recliner chair for ~ 1 hour if possible. Chair adjusted per pt request. Call bell and all needed items within reach. Discussed with nursing how to safely transfer pt back to bed at end of session.       If plan is discharge home, recommend the following:  Two people to help with walking and/or transfers;Two people to help with bathing/dressing/bathroom;Assistance with cooking/housework;Assist for transportation;Help with stairs or ramp for entrance   Equipment Recommendations  Other (comment) (defer to next venue of care)       Precautions / Restrictions Precautions Precautions: Fall;Back Required Braces or Orthoses: Spinal Brace Spinal Brace: Thoracolumbosacral orthotic Spinal Brace Comments: for comfort only to be donned EOB       Mobility Bed Mobility Overal bed mobility: Needs Assistance Bed Mobility: Sidelying to Sit, Rolling Rolling: +2 for physical assistance, Max assist Sidelying to sit: Max assist, +2 for physical assistance       General bed mobility comments: Limited by increased pain  and required max A x2 for task completion    Transfers Overall transfer level: Needs assistance Equipment used: 2 person hand held assist Transfers: Sit to/from Stand, Bed to chair/wheelchair/BSC Sit to Stand: Mod assist, +2 physical assistance     Step pivot transfers: +2 physical assistance, Min assist           Balance Overall balance assessment: Needs assistance Sitting-balance support: Feet supported, Bilateral upper extremity supported Sitting balance-Leahy Scale: Fair     Standing balance support: During functional activity, Bilateral upper extremity supported Standing balance-Leahy Scale: Fair                             ADL either performed or assessed with clinical judgement   ADL                           Toilet Transfer: Moderate assistance;Minimal assistance;+2 for physical assistance                  Extremity/Trunk Assessment Upper Extremity Assessment Upper Extremity Assessment: Generalized weakness   Lower Extremity Assessment Lower Extremity Assessment: Generalized weakness        Vision Patient Visual Report: No change from baseline            Cognition Arousal: Alert Behavior During Therapy: Anxious Overall Cognitive Status: Within Functional Limits for tasks assessed  General Comments: Anxious regarding mobility due to pain but willing to attempt                   Pertinent Vitals/ Pain       Pain Assessment Pain Assessment: Faces Faces Pain Scale: Hurts even more Pain Location: lower back Pain Descriptors / Indicators: Aching, Discomfort, Grimacing, Guarding, Spasm, Operative site guarding Pain Intervention(s): Monitored during session, Repositioned         Frequency  Min 1X/week        Progress Toward Goals  OT Goals(current goals can now be found in the care plan section)  Progress towards OT goals: Progressing toward goals          Co-evaluation    PT/OT/SLP Co-Evaluation/Treatment: Yes Reason for Co-Treatment: Complexity of the patient's impairments (multi-system involvement);For patient/therapist safety;To address functional/ADL transfers PT goals addressed during session: Mobility/safety with mobility OT goals addressed during session: ADL's and self-care      AM-PAC OT "6 Clicks" Daily Activity     Outcome Measure   Help from another person eating meals?: None Help from another person taking care of personal grooming?: A Little Help from another person toileting, which includes using toliet, bedpan, or urinal?: Total Help from another person bathing (including washing, rinsing, drying)?: A Lot Help from another person to put on and taking off regular upper body clothing?: A Lot Help from another person to put on and taking off regular lower body clothing?: Total 6 Click Score: 13    End of Session    OT Visit Diagnosis: Unsteadiness on feet (R26.81);Repeated falls (R29.6);Muscle weakness (generalized) (M62.81)   Activity Tolerance Patient limited by pain   Patient Left with call bell/phone within reach;Other (comment);in chair;with chair alarm set   Nurse Communication Mobility status        Time: [phone] OT Time Calculation (min): 23 min  Charges: OT General Charges $OT Visit: 1 Visit OT Treatments $Therapeutic Activity: 8-22 mins  Jackquline Denmark, MS, OTR/L , CBIS ascom [phone]  03/26/23, 1:38 PM

## 2023-03-26 NOTE — Progress Notes (Signed)
  Progress Note   Patient: Adam Cabrera ZOX:[id number] DOB: July 16, 1986 DOA: 03/23/2023     3 DOS: the patient was seen and examined on 03/26/2023   Brief hospital course:  Adam Cabrera is a 37 y.o. male with medical history significant for hypertension, scrotal cellulitis, history of IV drug use, presents with low back pain.  MRI of the lumbar spine showed Epidural extension with epidural phlegmon/abscess within the ventral epidural space extending from L1-2 through L3-4.  Blood culture positive for Staph aureus, patient treated with cefepime and vancomycin.  Patient has been seen by ID and neurosurgery, L spine washout performed 8/19.      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 MSSA septicemia. Acute back pain with sciatica Patient met sepsis criteria at time of admission with heart rate 144, respiratory rate 26, this is secondary to epidural abscess. Patient has been seen by neurosurgery, epidural washout performed 8/19. Consult from ID is obtained.  Antibiotic changed to cefazolin. Patient still have significant pain from surgical site today, continue pain medicine.  I will change morphine to dilaudid for now, alternating with hydrocodone. Repeat blood culture sent out 8/21, echocardiogram showed ejection fraction 65 to 70%, no vegetation identified, no valvular abnormality. Discussed with ID yesterday, most likely will treat the hospital for 4 weeks with IV antibiotics, followed with oral antibiotics.   Hypokalemia. Hyponatremia. Potassium 2.9, give 20 mEq of IV KCl, 80 mEq of oral KCl.  Check levels tomorrow.  Obesity (BMI 30-39.9) Diet and exercise.      Hypertension Continue to monitor      Subjective:  Patient still has a pain in the back.  Was able to sit in the chair for 1 hour.  Physical Exam: Vitals:   03/26/23 1200 03/26/23 1300 03/26/23 1400 03/26/23 1500  BP: (!) 149/91   126/87  Pulse: 88 97 (!) 102 98  Resp: (!) 21 13 17 16   Temp: 98.2 F (36.8 C)     TempSrc: Oral     SpO2: 99% 96% 98% 98%  Weight:      Height:       General exam: Appears calm and comfortable  Respiratory system: Clear to auscultation. Respiratory effort normal. Cardiovascular system: S1 & S2 heard, RRR. No JVD, murmurs, rubs, gallops or clicks. No pedal edema. Gastrointestinal system: Abdomen is nondistended, soft and nontender. No organomegaly or masses felt. Normal bowel sounds heard. Central nervous system: Alert and oriented. No focal neurological deficits. Extremities: Symmetric 5 x 5 power. Skin: No rashes, lesions or ulcers Psychiatry: Judgement and insight appear normal. Mood & affect appropriate.    Data Reviewed:  Lab results reviewed.  Family Communication: None  Disposition: Status is: Inpatient Remains inpatient appropriate because: Severity of disease, IV treatment.     Time spent: 35 minutes  Author: Marrion Coy, MD 03/26/2023 3:46 PM  For on call review [email or web address].

## 2023-03-26 NOTE — Progress Notes (Signed)
Inpatient Rehab Admissions Coordinator:   Consult received and chart reviewed.  Pt admitted with epidural abscess, now s/p L2-3 decompression, max assist +2 to EOB and limited by pain.  Note per infectious disease, patient has a history of IVDU and will require IV antibiotics a period of time. We are not able to accommodate this patient's needs with a short CIR stay. Patient will likely need to remain in the acute hospital until his IV antibiotics are completed.  Can likely discharge directly home at that time. I will sign off.    Estill Dooms, PT, DPT Admissions Coordinator [phone] 03/26/23  10:40 AM

## 2023-03-26 NOTE — Plan of Care (Signed)
  Problem: Education: Goal: Knowledge of General Education information will improve Description: Including pain rating scale, medication(s)/side effects and non-pharmacologic comfort measures Outcome: Progressing   Problem: Health Behavior/Discharge Planning: Goal: Ability to manage health-related needs will improve Outcome: Progressing   Problem: Clinical Measurements: Goal: Ability to maintain clinical measurements within normal limits will improve Outcome: Progressing Goal: Diagnostic test results will improve Outcome: Progressing Goal: Respiratory complications will improve Outcome: Progressing Goal: Cardiovascular complication will be avoided Outcome: Progressing   Problem: Nutrition: Goal: Adequate nutrition will be maintained Outcome: Progressing   Problem: Coping: Goal: Level of anxiety will decrease Outcome: Progressing   Problem: Elimination: Goal: Will not experience complications related to bowel motility Outcome: Progressing Goal: Will not experience complications related to urinary retention Outcome: Progressing   Problem: Pain Managment: Goal: General experience of comfort will improve Outcome: Progressing   Problem: Safety: Goal: Ability to remain free from injury will improve Outcome: Progressing   Problem: Skin Integrity: Goal: Risk for impaired skin integrity will decrease Outcome: Progressing   Problem: Education: Goal: Knowledge of General Education information will improve Description: Including pain rating scale, medication(s)/side effects and non-pharmacologic comfort measures Outcome: Progressing   Problem: Health Behavior/Discharge Planning: Goal: Ability to manage health-related needs will improve Outcome: Progressing

## 2023-03-26 NOTE — Discharge Instructions (Addendum)
Your surgeon has performed an operation on your lumbar spine (low back) to relieve pressure on one or more nerves. Many times, patients feel better immediately after surgery and can "overdo it." Even if you feel well, it is important that you follow these activity guidelines. If you do not let your back heal properly from the surgery, you can increase the chance of return of your symptoms. The following are instructions to help in your recovery once you have been discharged from the hospital.  * It is ok to take NSAIDs after surgery.  Activity    No bending, lifting, or twisting ("BLT"). Avoid lifting objects heavier than 10 pounds (gallon milk jug).  Where possible, avoid household activities that involve lifting, bending, pushing, or pulling such as laundry, vacuuming, grocery shopping, and childcare. Try to arrange for help from friends and family for these activities while your back heals.  Increase physical activity slowly as tolerated.  Taking short walks is encouraged, but avoid strenuous exercise. Do not jog, run, bicycle, lift weights, or participate in any other exercises unless specifically allowed by your doctor. Avoid prolonged sitting, including car rides.  Talk to your doctor before resuming sexual activity.  You should not drive until cleared by your doctor.  Until released by your doctor, you should not return to work or school.  You should rest at home and let your body heal.   You may shower three days after your surgery.  After showering, lightly dab your incision dry. Do not take a tub bath or go swimming for 3 weeks, or until approved by your doctor at your follow-up appointment.  If you smoke, we strongly recommend that you quit.  Smoking has been proven to interfere with normal healing in your back and will dramatically reduce the success rate of your surgery. Please contact [facility] (800-QUIT-NOW) and use the resources at [email or web address] for assistance in stopping  smoking.  Surgical Incision   If you have a dressing on your incision, you may remove it three days after your surgery. Keep your incision area clean and dry.  If you have staples or stitches on your incision, you should have a follow up scheduled for removal. If you do not have staples or stitches, you will have steri-strips (small pieces of surgical tape) or Dermabond glue. The steri-strips/glue should begin to peel away within about a week (it is fine if the steri-strips fall off before then). If the strips are still in place one week after your surgery, you may gently remove them.  Diet            You may return to your usual diet. Be sure to stay hydrated.  When to Contact us  Although your surgery and recovery will likely be uneventful, you may have some residual numbness, aches, and pains in your back and/or legs. This is normal and should improve in the next few weeks.  However, should you experience any of the following, contact us immediately: New numbness or weakness Pain that is progressively getting worse, and is not relieved by your pain medications or rest Bleeding, redness, swelling, pain, or drainage from surgical incision Chills or flu-like symptoms Fever greater than 101.0 F (38.3 C) Problems with bowel or bladder functions Difficulty breathing or shortness of breath Warmth, tenderness, or swelling in your calf  Contact Information How to contact us:  If you have any questions/concerns before or after surgery, you can reach [place] at [phone], or you can send a mychart message.  We can be reached by phone or mychart 8am-4pm, Monday-Friday.  *Please note: Calls after 4pm are forwarded to a third party answering service. Mychart messages are not routinely monitored during evenings, weekends, and holidays. Please call our office to contact the answering service for urgent concerns during non-business hours.      Transportation Resources  Agency Name: [facility] Agency Address: [address] [place], [place] [address] Phone: [phone] Email: [email or web address] Website: [email or web address] Service(s) Offered: Housing services, self-sufficiency, congregate meal program, weatherization program, Field seismologist program, emergency food assistance,  housing counseling, home ownership program, wheels-towork program.  Agency Name: [facility] [employer] [phone]) Address: [address] [place], [place] [address] Phone: [phone] Website: [email or web address] Service(s) Offered: Transportation for [employer], subscription and demand response; Dial-a-Ride for citizens 66 years of age or older.  Agency Name: [facility] [employer] Social [employer] Address: [address] [place], [place] [address] Phone: [phone] Service(s) Offered: Child support services; child welfare services; food stamps; Medicaid; work first family assistance; and aid with fuel,  rent, food and medicine, transportation assistance.  Agency Name: Disabled [employer] (DA[employer]) Transportation  Network Phone: [phone] Service(s) Offered: Transports veterans to the [facility] medical center. Call  forty-eight hours in advance and leave the name, telephone  number, date, and time of appointment. Veteran will be  contacted by the driver the day before the appointment to  arrange a pick up point   Transportation Resources  Agency Name: [facility] Agency Address: [address] [place], [place] [address] Phone: [phone] Email: [email or web address] Website: [email or web address] Service(s) Offered: Housing services, self-sufficiency, congregate meal program, weatherization program, Field seismologist program, emergency food assistance,  housing counseling, home ownership program, wheels-towork program.  Agency Name: [facility] [employer]  [phone]) Address: [address], [place], [place] [address] Phone: [phone] Website: [email or web address] Service(s) Offered: Transportation for [employer], subscription and demand response; Dial-a-Ride for citizens 22 years of age or older.  Agency Name: Department of Social Services Address: [address] [place], [place] [address] Phone: [phone] Service(s) Offered: Child support services; child welfare services; food stamps; Medicaid; work first family assistance; and aid with fuel,  rent, food and medicine, transportation assistance.   [facility] [place] transportation  [phone]  Agency Name: Disabled [employer] (DAV) Transportation  Network Phone: [phone] Service(s) Offered: Transports veterans to the [facility] medical center. Call  forty-eight hours in advance and leave the name, telephone  number, date, and time of appointment. Veteran will be  contacted by the driver the day before the appointment to  arrange a pick up point    [employer] currently provides door to door services. [employer] connects with [employer] daily for services to [facility]. [facility] also performs contract services to [employer] operates 27 vehicles, all but 3 mini-vans are equipped with lifts for special needs as well as the general public. [facility] drivers are each CDL certified and trained in First Aid and CPR. [employer] was established in 2002 by [employer]. An independent Industrial/product designer. [employer] operates via Cytogeneticist with required Research scientist (physical sciences) from [place]. [employer] provides over 80,000 passenger trips each year, including [facility] and [employer] sites.  Call at least by 11 AM one business day prior to needing transportation  [employer].  [place], [place] [address]     Office Hours: Monday-Friday   8 AM - 5 PM   Pt to find PCP in the area

## 2023-03-26 NOTE — Progress Notes (Signed)
Physical Therapy Treatment Patient Details Name: Adam Cabrera MRN: [id number] DOB: Jan 13, 1986 Today's Date: 03/26/2023   History of Present Illness 37 y.o. male with medical history significant for hypertension, scrotal cellulitis, history of IV drug use, presents with low back pain.  MRI of the lumbar spine showed Epidural extension with epidural phlegmon/abscess within the ventral epidural space extending from L1-2 through L3-4 and s/p lumbar laminectomy    PT Comments  Pt is seen for a co-treatment with OT and PT. Pt is slowly progressing towards PT goals, although continues to be limited by pain. Pt is received in bed, he is agreeable to PT session. Pt performs bed mobility max A x2, transfers min-mod A x2. Pt able to tolerate sitting EOB slightly better this visit than prior. Pt able to STS with mod A x2 and self correct to upright posture with no cuing. Additionally, Pt able to perform min A x2 with stepping transfer onto recliner. Overall Pt cont to demonstrate high motivation and cooperative during session but is limited by pain management. Pt in recliner at end of session and spoke with RN about promoting sitting tolerance in recliner. Pt would benefit from cont skilled PT to address above deficits and promote optimal return to PLOF.    If plan is discharge home, recommend the following: A lot of help with walking and/or transfers;A lot of help with bathing/dressing/bathroom;Assistance with cooking/housework;Assist for transportation;Help with stairs or ramp for entrance   Can travel by private vehicle        Equipment Recommendations  Rolling walker (2 wheels);BSC/3in1    Recommendations for Other Services       Precautions / Restrictions Precautions Precautions: Fall;Back Required Braces or Orthoses: Spinal Brace Spinal Brace: Thoracolumbosacral orthotic Spinal Brace Comments: for comfort only to be donned EOB     Mobility  Bed Mobility Overal bed mobility: Needs  Assistance Bed Mobility: Sidelying to Sit, Rolling Rolling: +2 for physical assistance, Max assist Sidelying to sit: Max assist, +2 for physical assistance       General bed mobility comments: Limited by increased pain and required max A x2 for task completion Patient Response: Cooperative, Anxious  Transfers Overall transfer level: Needs assistance Equipment used: 2 person hand held assist Transfers: Sit to/from Stand, Bed to chair/wheelchair/BSC Sit to Stand: Mod assist, +2 physical assistance   Step pivot transfers: +2 physical assistance, Min assist       General transfer comment: Able to initiate and self correct upright posture in standing. Pt cont to be limited by pain    Ambulation/Gait               General Gait Details: NT secondary to pain   Stairs             Wheelchair Mobility     Tilt Bed Tilt Bed Patient Response: Cooperative, Anxious  Modified Rankin (Stroke Patients Only)       Balance Overall balance assessment: Needs assistance Sitting-balance support: Feet supported, Bilateral upper extremity supported Sitting balance-Leahy Scale: Fair Sitting balance - Comments: Requires BUE support on bed for seated balance but limited due to pain, although improved from prior visit   Standing balance support: During functional activity, Bilateral upper extremity supported Standing balance-Leahy Scale: Fair Standing balance comment: Able to maintain static standing balance but requires cuing for maintaining upright posture with mod A x2  Cognition Arousal: Alert Behavior During Therapy: Anxious Overall Cognitive Status: Within Functional Limits for tasks assessed                                 General Comments: Anxious regarding mobility due to pain but willing to attempt        Exercises      General Comments        Pertinent Vitals/Pain Pain Assessment Pain Assessment:  Faces Faces Pain Scale: Hurts even more Pain Location: lower back Pain Descriptors / Indicators: Aching, Discomfort, Grimacing, Guarding, Spasm, Operative site guarding Pain Intervention(s): Monitored during session, Premedicated before session, Repositioned    Home Living                          Prior Function            PT Goals (current goals can now be found in the care plan section) Acute Rehab PT Goals Patient Stated Goal: to get better PT Goal Formulation: With patient Time For Goal Achievement: 04/08/23 Potential to Achieve Goals: Good Progress towards PT goals: Progressing toward goals    Frequency    Min 1X/week      PT Plan      Co-evaluation   Reason for Co-Treatment: Complexity of the patient's impairments (multi-system involvement);For patient/therapist safety;To address functional/ADL transfers PT goals addressed during session: Mobility/safety with mobility        AM-PAC PT "6 Clicks" Mobility   Outcome Measure  Help needed turning from your back to your side while in a flat bed without using bedrails?: A Lot Help needed moving from lying on your back to sitting on the side of a flat bed without using bedrails?: A Lot Help needed moving to and from a bed to a chair (including a wheelchair)?: A Lot Help needed standing up from a chair using your arms (e.g., wheelchair or bedside chair)?: A Lot Help needed to walk in hospital room?: Total Help needed climbing 3-5 steps with a railing? : Total 6 Click Score: 10    End of Session   Activity Tolerance: Patient limited by pain Patient left: in chair;with call bell/phone within reach Nurse Communication: Mobility status PT Visit Diagnosis: Unsteadiness on feet (R26.81);Pain;Muscle weakness (generalized) (M62.81);Difficulty in walking, not elsewhere classified (R26.2) Pain - part of body:  (low back)     Time: 2130-8657 PT Time Calculation (min) (ACUTE ONLY): 17 min  Charges:                             Elmon Else, SPT    Elzabeth Mcquerry 03/26/2023, 1:13 PM

## 2023-03-26 NOTE — Plan of Care (Signed)
  Problem: Education: Goal: Knowledge of General Education information will improve Description Including pain rating scale, medication(s)/side effects and non-pharmacologic comfort measures Outcome: Progressing   

## 2023-03-26 NOTE — Progress Notes (Signed)
   Neurosurgery Progress Note  History: Adam Cabrera is s/p L2-3 decompression for epidural phlegmon.  POD2: continued back pain. Improved with some medication changes.  POD1: improved leg pain and strength overnight.  Physical Exam: Vitals:   03/26/23 0701 03/26/23 0800  BP: (!) 139/94   Pulse: 94 94  Resp: 17 18  Temp:  98.3 F (36.8 C)  SpO2: 94% 93%    AA Ox3 CNI  Strength:4/5 throughout BLE  HV 110 overnight   Data:  Other tests/results:  Cultures pending. Gram stain showing gram positive cocci  Assessment/Plan:  Adam Cabrera is a 37 year old presenting with an L2-3 abscess and right greater than left lower extremity weakness status post L2-3 decompression for evacuation of epidural phlegmon.  - mobilize - pain control - DVT prophylaxis - PT - continue HV and incisional wound VAC  Manning Charity PA-C Department of Neurosurgery

## 2023-03-26 NOTE — Progress Notes (Signed)
Date of Admission:  03/23/2023      ID: Adam Cabrera is a 37 y.o. 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 Active Problems:   Acute back pain with sciatica   Hypertension   Obesity (BMI 30-39.9)   Mucous retention cyst of maxillary sinus   MSSA (methicillin susceptible Staphylococcus aureus) septicemia (HCC)   Psoas abscess, left (HCC)  Underwent laminectomy and debridement yesterday  Subjective: Patient has pain In bed As per his nurse he had severe pain when he tried to stand up  Medications:   Chlorhexidine Gluconate Cloth  6 each Topical Daily   mupirocin ointment  1 Application Nasal BID   nicotine  21 mg Transdermal Daily   sodium chloride flush  3 mL Intravenous Q12H    Objective: Vital signs in last 24 hours: Patient Vitals for the past 24 hrs:  BP Temp Temp src Pulse Resp SpO2  03/26/23 1200 (!) 149/91 98.2 F (36.8 C) Oral 88 (!) 21 99 %  03/26/23 1100 (!) 143/103 -- -- 90 15 98 %  03/26/23 1000 (!) 141/100 -- -- 93 13 96 %  03/26/23 0900 (!) 146/99 -- -- 95 17 92 %  03/26/23 0800 (!) 139/94 98.3 F (36.8 C) Oral 94 18 93 %  03/26/23 0701 (!) 139/94 -- -- 94 17 94 %  03/26/23 0600 (!) 130/90 -- -- 95 18 93 %  03/26/23 0500 -- -- -- 94 14 94 %  03/26/23 0403 -- -- -- 93 16 94 %  03/26/23 0400 137/88 98.2 F (36.8 C) Oral 93 17 93 %  03/26/23 0300 126/82 -- -- 93 19 94 %  03/26/23 0200 117/84 -- -- 95 19 93 %  03/26/23 0100 121/80 -- -- 91 (!) 21 96 %  03/26/23 0000 121/82 98.2 F (36.8 C) Tympanic 96 20 94 %  03/25/23 2300 111/80 -- -- (!) 104 (!) 22 95 %  03/25/23 2200 116/87 -- -- 100 14 95 %  03/25/23 2100 126/77 -- -- (!) 103 13 98 %  03/25/23 2000 (!) 145/105 -- -- 98 12 98 %  03/25/23 1900 127/89 98.2 F (36.8 C) Oral (!) 103 19 98 %  03/25/23 1700 (!) 137/92 -- -- (!) 102 19 95 %  03/25/23 1600 137/87 -- -- (!) 102 (!) 26 94 %  03/25/23 1500 (!) 143/86 -- -- (!) 102 20 94 %  03/25/23 1400 127/81 98.2 F (36.8 C) Oral (!) 104 18  97 %      PHYSICAL EXAM:  General: Alert, cooperative, some distress,  Lungs: Clear to auscultation bilaterally. No Wheezing or Rhonchi. No rales. Heart: Regular rate and rhythm, no murmur, rub or gallop. Abdomen: Soft, non-tender,not distended. Bowel sounds normal. No masses Extremities:edema legs Venous staining both legs rt > left Skin: No rashes or lesions. Or bruising Lymph: Cervical, supraclavicular normal. Neurologic: did not examine in detail  Lab Results    Latest Ref Rng & Units 03/24/2023    6:09 AM 03/23/2023    4:14 PM 02/12/2023    6:20 PM  CBC  WBC 4.0 - 10.5 K/uL 9.9  16.6  6.5   Hemoglobin 13.0 - 17.0 g/dL 16.1  09.6  04.5   Hematocrit 39.0 - 52.0 % 35.9  42.5  34.0   Platelets 150 - 400 K/uL 194  263  118        Latest Ref Rng & Units 03/26/2023    4:54 AM 03/25/2023    9:09 AM  03/24/2023    6:09 AM  CMP  Glucose 70 - 99 mg/dL 161  096  98   BUN 6 - 20 mg/dL 10  10  13    Creatinine 0.61 - 1.24 mg/dL 0.45  4.09  8.11   Sodium 135 - 145 mmol/L 132  128  133   Potassium 3.5 - 5.1 mmol/L 2.9  3.6  3.2   Chloride 98 - 111 mmol/L 101  102  103   CO2 22 - 32 mmol/L 25  21  21    Calcium 8.9 - 10.3 mg/dL 8.1  8.1  8.5   Total Protein 6.5 - 8.1 g/dL   8.1   Total Bilirubin 0.3 - 1.2 mg/dL   1.5   Alkaline Phos 38 - 126 U/L   78   AST 15 - 41 U/L   37   ALT 0 - 44 U/L   44       Microbiology: 8/18 [facility]- MSSA 8/19 Surgical culture staph aureus 8/21 BC repeated Studies/Results: ECHOCARDIOGRAM COMPLETE  Result Date: 03/25/2023    ECHOCARDIOGRAM REPORT   Patient Name:   Adam Cabrera Date of Exam: 03/25/2023 Medical Rec #:  [id number]     Height:       74.0 in Accession #:    [id number]    Weight:       285.1 lb Date of Birth:  May 24, 1986      BSA:          2.527 m Patient Age:    37 years      BP:           136/90 mmHg Patient Gender: M             HR:           95 bpm. Exam Location:  [facility] Procedure: 2D Echo, Color Doppler and Cardiac Doppler Indications:      Bacteremia R78.81  History:         Patient has no prior history of Echocardiogram examinations.                  Cellulitis.  Sonographer:     Cristela Blue Referring Phys:  [id number] Avie Echevaria KOCH Diagnosing Phys: Yvonne Kendall MD  Sonographer Comments: Suboptimal parasternal window. IMPRESSIONS  1. Left ventricular ejection fraction, by estimation, is 65 to 70%. The left ventricle has normal function. Left ventricular endocardial border not optimally defined to evaluate regional wall motion. Indeterminate diastolic filling due to E-A fusion.  2. Right ventricular systolic function is normal. The right ventricular size is normal. Tricuspid regurgitation signal is inadequate for assessing PA pressure.  3. The mitral valve is normal in structure. No evidence of mitral valve regurgitation.  4. The aortic valve was not well visualized. Aortic valve regurgitation is not visualized. No aortic stenosis is present.  5. The inferior vena cava is normal in size with greater than 50% respiratory variability, suggesting right atrial pressure of 3 mmHg. FINDINGS  Left Ventricle: Left ventricular ejection fraction, by estimation, is 65 to 70%. The left ventricle has normal function. Left ventricular endocardial border not optimally defined to evaluate regional wall motion. The left ventricular internal cavity size was normal in size. There is no left ventricular hypertrophy. Indeterminate diastolic filling due to E-A fusion. Right Ventricle: The right ventricular size is normal. No increase in right ventricular wall thickness. Right ventricular systolic function is normal. Tricuspid regurgitation signal is inadequate for assessing PA pressure. Left Atrium: Left atrial  size was normal in size. Right Atrium: Right atrial size was normal in size. Pericardium: There is no evidence of pericardial effusion. Mitral Valve: The mitral valve is normal in structure. No evidence of mitral valve regurgitation. Tricuspid Valve: The  tricuspid valve is not well visualized. Tricuspid valve regurgitation is trivial. Aortic Valve: The aortic valve was not well visualized. Aortic valve regurgitation is not visualized. No aortic stenosis is present. Aortic valve mean gradient measures 4.0 mmHg. Aortic valve peak gradient measures 6.0 mmHg. Aortic valve area, by VTI measures 3.60 cm. Pulmonic Valve: The pulmonic valve was not well visualized. Pulmonic valve regurgitation is trivial. No evidence of pulmonic stenosis. Aorta: The aortic root is normal in size and structure. Pulmonary Artery: The pulmonary artery is of normal size. Venous: The inferior vena cava is normal in size with greater than 50% respiratory variability, suggesting right atrial pressure of 3 mmHg. IAS/Shunts: The interatrial septum was not well visualized.  LEFT VENTRICLE PLAX 2D LVIDd:         4.40 cm   Diastology LVIDs:         2.60 cm   LV e' medial:    6.31 cm/s LV PW:         1.09 cm   LV E/e' medial:  11.2 LV IVS:        0.92 cm   LV e' lateral:   8.81 cm/s LVOT diam:     2.30 cm   LV E/e' lateral: 8.0 LV SV:         62 LV SV Index:   25 LVOT Area:     4.15 cm  RIGHT VENTRICLE RV Basal diam:  3.90 cm RV Mid diam:    3.80 cm RV S prime:     22.00 cm/s TAPSE (M-mode): 3.6 cm LEFT ATRIUM             Index        RIGHT ATRIUM           Index LA diam:        3.50 cm 1.39 cm/m   RA Area:     12.80 cm LA Vol (A2C):   30.7 ml 12.15 ml/m  RA Volume:   26.20 ml  10.37 ml/m LA Vol (A4C):   33.8 ml 13.38 ml/m LA Biplane Vol: 32.4 ml 12.82 ml/m  AORTIC VALVE AV Area (Vmax):    3.44 cm AV Area (Vmean):   3.06 cm AV Area (VTI):     3.60 cm AV Vmax:           122.00 cm/s AV Vmean:          92.700 cm/s AV VTI:            0.173 m AV Peak Grad:      6.0 mmHg AV Mean Grad:      4.0 mmHg LVOT Vmax:         101.00 cm/s LVOT Vmean:        68.200 cm/s LVOT VTI:          0.150 m LVOT/AV VTI ratio: 0.87  AORTA Ao Root diam: 3.30 cm MITRAL VALVE MV Area (PHT): 5.70 cm     SHUNTS MV Decel  Time: 133 msec     Systemic VTI:  0.15 m MV E velocity: 70.70 cm/s   Systemic Diam: 2.30 cm MV A velocity: 110.00 cm/s MV E/A ratio:  0.64 Cristal De[place] End MD Electronically signed by Yvonne Kendall MD Signature Date/Time: 03/25/2023/3:38:02 PM  Final    DG Lumbar Spine 1 View  Result Date: 03/24/2023 CLINICAL DATA:  Elective surgery. L2-L3 laminectomy for epidural abscess. EXAM: LUMBAR SPINE - 1 VIEW COMPARISON:  Preoperative imaging FINDINGS: Single lateral fluoroscopic spot view of the lumbar spine obtained in the operating room. Surgical instrument localizes at the L3 level. Fluoroscopy time 7 seconds. Dose 6.0953 mGy. IMPRESSION: Intraoperative fluoroscopy during lumbar spine surgery. Electronically Signed   By: Narda Rutherford M.D.   On: 03/24/2023 19:04   DG C-Arm 1-60 Min-No Report  Result Date: 03/24/2023 Fluoroscopy was utilized by the requesting physician.  No radiographic interpretation.   DG C-Arm 1-60 Min-No Report  Result Date: 03/24/2023 Fluoroscopy was utilized by the requesting physician.  No radiographic interpretation.     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 ?

## 2023-03-27 DIAGNOSIS — K625 Hemorrhage of anus and rectum: Secondary | ICD-10-CM | POA: Diagnosis not present

## 2023-03-27 DIAGNOSIS — G061 Intraspinal abscess and granuloma: Secondary | ICD-10-CM | POA: Diagnosis not present

## 2023-03-27 DIAGNOSIS — A4101 Sepsis due to Methicillin susceptible Staphylococcus aureus: Secondary | ICD-10-CM | POA: Diagnosis not present

## 2023-03-27 DIAGNOSIS — R7881 Bacteremia: Secondary | ICD-10-CM

## 2023-03-27 DIAGNOSIS — K6812 Psoas muscle abscess: Secondary | ICD-10-CM | POA: Diagnosis not present

## 2023-03-27 DIAGNOSIS — D696 Thrombocytopenia, unspecified: Secondary | ICD-10-CM

## 2023-03-27 DIAGNOSIS — B9561 Methicillin susceptible Staphylococcus aureus infection as the cause of diseases classified elsewhere: Secondary | ICD-10-CM

## 2023-03-27 DIAGNOSIS — M5440 Lumbago with sciatica, unspecified side: Secondary | ICD-10-CM | POA: Diagnosis not present

## 2023-03-27 DIAGNOSIS — M4646 Discitis, unspecified, lumbar region: Secondary | ICD-10-CM | POA: Diagnosis not present

## 2023-03-27 LAB — CBC
HCT: 33.8 % — ABNORMAL LOW (ref 39.0–52.0)
Hemoglobin: 11 g/dL — ABNORMAL LOW (ref 13.0–17.0)
MCH: 27 pg (ref 26.0–34.0)
MCHC: 32.5 g/dL (ref 30.0–36.0)
MCV: 83 fL (ref 80.0–100.0)
Platelets: 144 10*3/uL — ABNORMAL LOW (ref 150–400)
RBC: 4.07 MIL/uL — ABNORMAL LOW (ref 4.22–5.81)
RDW: 15.9 % — ABNORMAL HIGH (ref 11.5–15.5)
WBC: 5.9 10*3/uL (ref 4.0–10.5)
nRBC: 0 % (ref 0.0–0.2)

## 2023-03-27 LAB — COMPREHENSIVE METABOLIC PANEL
ALT: 18 U/L (ref 0–44)
AST: 15 U/L (ref 15–41)
Albumin: 3 g/dL — ABNORMAL LOW (ref 3.5–5.0)
Alkaline Phosphatase: 70 U/L (ref 38–126)
Anion gap: 6 (ref 5–15)
BUN: 8 mg/dL (ref 6–20)
CO2: 25 mmol/L (ref 22–32)
Calcium: 8.4 mg/dL — ABNORMAL LOW (ref 8.9–10.3)
Chloride: 102 mmol/L (ref 98–111)
Creatinine, Ser: 0.43 mg/dL — ABNORMAL LOW (ref 0.61–1.24)
GFR, Estimated: >60 mL/min (ref >60)
Glucose, Bld: 92 mg/dL (ref 70–99)
Potassium: 3.7 mmol/L (ref 3.5–5.1)
Sodium: 133 mmol/L — ABNORMAL LOW (ref 135–145)
Total Bilirubin: 1.1 mg/dL (ref 0.3–1.2)
Total Protein: 7.4 g/dL (ref 6.5–8.1)

## 2023-03-27 LAB — MAGNESIUM: Magnesium: 1.9 mg/dL (ref 1.7–2.4)

## 2023-03-27 MED ORDER — SENNOSIDES-DOCUSATE SODIUM 8.6-50 MG PO TABS
2.0000 | ORAL_TABLET | Freq: Two times a day (BID) | ORAL | Status: DC
  Administered 2023-03-27 – 2023-05-13 (×77): 2 via ORAL
  Filled 2023-03-27 (×92): qty 2

## 2023-03-27 MED ORDER — HYDROMORPHONE HCL 1 MG/ML IJ SOLN
0.5000 mg | INTRAMUSCULAR | Status: DC | PRN
  Administered 2023-03-27 – 2023-04-09 (×31): 0.5 mg via INTRAVENOUS
  Filled 2023-03-27 (×32): qty 0.5

## 2023-03-27 MED ORDER — LACTULOSE 10 GM/15ML PO SOLN
20.0000 g | Freq: Once | ORAL | Status: AC
  Administered 2023-03-27: 20 g via ORAL
  Filled 2023-03-27: qty 30

## 2023-03-27 MED ORDER — GABAPENTIN 300 MG PO CAPS
300.0000 mg | ORAL_CAPSULE | Freq: Three times a day (TID) | ORAL | Status: DC
  Administered 2023-03-27 – 2023-05-13 (×140): 300 mg via ORAL
  Filled 2023-03-27 (×142): qty 1

## 2023-03-27 MED ORDER — METHOCARBAMOL 500 MG PO TABS
750.0000 mg | ORAL_TABLET | Freq: Four times a day (QID) | ORAL | Status: DC | PRN
  Administered 2023-03-27 – 2023-04-10 (×24): 750 mg via ORAL
  Filled 2023-03-27 (×25): qty 2

## 2023-03-27 MED ORDER — OXYCODONE-ACETAMINOPHEN 5-325 MG PO TABS
1.0000 | ORAL_TABLET | ORAL | Status: DC | PRN
  Administered 2023-03-27 – 2023-04-09 (×39): 2 via ORAL
  Filled 2023-03-27 (×41): qty 2

## 2023-03-27 NOTE — Progress Notes (Signed)
Progress Note   Patient: Adam Cabrera WUJ:[id number] DOB: [birth year] DOA: 03/23/2023     4 DOS: the patient was seen and examined on 03/27/2023   Brief hospital course:  LINTON LORTZ is a 37 y.o. male with medical history significant for hypertension, scrotal cellulitis, history of IV drug use, presents with low back pain.  MRI of the lumbar spine showed Epidural extension with epidural phlegmon/abscess within the ventral epidural space extending from L1-2 through L3-4.  Blood culture positive for Staph aureus, patient treated with cefepime and vancomycin.  Patient has been seen by ID and neurosurgery, L spine washout performed 8/19.      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 MSSA septicemia. Acute back pain with sciatica Patient met sepsis criteria at time of admission with heart rate 144, respiratory rate 26, this is secondary to epidural abscess. Patient has been seen by neurosurgery, epidural washout performed 8/19. Consult from ID is obtained.  Antibiotic changed to cefazolin. Repeat blood culture sent out 8/21, echocardiogram showed ejection fraction 65 to 70%, no vegetation identified, no valvular abnormality. Discussed with ID, most likely will treat the hospital for 4 weeks with IV antibiotics, followed with oral antibiotics. Patient still have severe back pain with numbing tingling in the lower extremity.  Discussed with neurosurgery, this is expected from decompression of the spinal cord. I will continue IV dilaudid, alternating with oral  Percocet.  Fresh red blood per rectum. Thrombocytopenia. Patient developed a mild thrombocytopenia today, this is probably due to sepsis.  Will continue to follow. Patient also had an episode of fresh red blood per rectum, this is after large bowel movement.  Patient has constipation, received a dose of lactulose. Rectal bleeding most likely due to internal hemorrhoids versus diverticulosis.  GI consult obtained.  Continue to follow CBC. I will discontinue prophylactic Lovenox.  Hypokalemia. Hyponatremia. Potassium has normalized.   Obesity (BMI 30-39.9) Diet and exercise.     Hypertension Continue to monitor      Subjective:  Patient still have significant lower back pain with spasm.  Able to sit up in the chair, walking a few steps. He had a significant constipation, had fresh red blood per rectum.  I had look at it,  probably is around 30 to 50 mL.  No blood clots.  Physical Exam: Vitals:   03/26/23 2200 03/26/23 2243 03/27/23 0505 03/27/23 0746  BP: (!) 141/88 136/86 137/84 135/85  Pulse: (!) 102 96 84 90  Resp: 15 18 18 17   Temp:  98.9 F (37.2 C) 98.2 F (36.8 C) 98.5 F (36.9 C)  TempSrc:  Oral Oral   SpO2: 97% 97% 98% 99%  Weight:      Height:       General exam: Appears calm and comfortable  Respiratory system: Clear to auscultation. Respiratory effort normal. Cardiovascular system: S1 & S2 heard, RRR. No JVD, murmurs, rubs, gallops or clicks. No pedal edema. Gastrointestinal system: Abdomen is nondistended, soft and nontender. No organomegaly or masses felt. Normal bowel sounds heard. Central nervous system: Alert and oriented.  No focal neurological deficits. Extremities: Symmetric 5 x 5 power. Skin: No rashes, lesions or ulcers Psychiatry: Judgement and insight appear normal. Mood & affect appropriate.    Data Reviewed:  Lab results reviewed.  Family Communication: None  Disposition: Status is: Inpatient Remains inpatient appropriate because:  Severity of disease, IV treatment.     Time spent: 35 minutes  Author: Marrion Coy, MD 03/27/2023 12:46 PM  For on call review [email or web address].

## 2023-03-27 NOTE — Progress Notes (Signed)
   Neurosurgery Progress Note  History: Adam Cabrera is s/p L2-3 decompression for epidural phlegmon.  POD3: NAEO POD2: continued back pain. Improved with some medication changes.  POD1: improved leg pain and strength overnight.  Physical Exam: Vitals:   03/27/23 0505 03/27/23 0746  BP: 137/84 135/85  Pulse: 84 90  Resp: 18 17  Temp: 98.2 F (36.8 C) 98.5 F (36.9 C)  SpO2: 98% 99%    AA Ox3 CNI  Strength:4/5 throughout BLE  HV output not recorded overnight. Minimal drainage in collection chamber this morning  Data:  Other tests/results:  Cultures positive for abundant Staph aureus.  Sensitivities pending. Gram stain showing gram positive cocci  Assessment/Plan:  Adam Cabrera is a 37 year old presenting with an L2-3 abscess and right greater than left lower extremity weakness status post L2-3 decompression for evacuation of epidural phlegmon.  - mobilize - pain control - DVT prophylaxis - PT -Removed Hemovac on 8/22. Continue incisional wound VAC. Will remove on 8/24 - continue antibiotics per ID recommendations.   Manning Charity PA-C Department of Neurosurgery

## 2023-03-27 NOTE — Plan of Care (Signed)

## 2023-03-27 NOTE — Progress Notes (Signed)
Physical Therapy Treatment Patient Details Name: Adam Cabrera MRN: [id number] DOB: 1985-09-22 Today's Date: 03/27/2023   History of Present Illness 37 y.o. male with medical history significant for hypertension, scrotal cellulitis, history of IV drug use, presents with low back pain.  MRI of the lumbar spine showed Epidural extension with epidural phlegmon/abscess within the ventral epidural space extending from L1-2 through L3-4 and s/p lumbar laminectomy    PT Comments  Pt was supine in bed upon arrival. He is A and O x 4." I know I got to do it." Pt puts forth great effort and overall required slightly less assistance to exit bed, stand, and take steps from EOB to recliner. He tolerated sitting in recliner for > 1 hour. RN contacted Chartered loss adjuster with pt struggling to stand from [facility] after BM (bloody/MD aware). Pt continues to have severe pain with mobility but is improving. Acute PT will continue to progress pt to PLOF.     If plan is discharge home, recommend the following: A lot of help with bathing/dressing/bathroom;Assistance with cooking/housework;Assist for transportation;Help with stairs or ramp for entrance;A lot of help with walking and/or transfers;Direct supervision/assist for medications management     Equipment Recommendations  Other (comment) (Ongoing assessment)       Precautions / Restrictions Precautions Precautions: Fall;Back Required Braces or Orthoses: Spinal Brace Spinal Brace: Thoracolumbosacral orthotic Spinal Brace Comments: for comfort only to be donned EOB Restrictions Weight Bearing Restrictions: Yes     Mobility  Bed Mobility Overal bed mobility: Needs Assistance Bed Mobility: Sidelying to Sit, Rolling Rolling: Mod assist Sidelying to sit: Max assist Supine to sit: Mod assist, Max assist, Used rails Sit to supine: Max assist General bed mobility comments: Pt was able to tolerate rolling to short sit with less assistance today. Sat EOB x ~ 5 minutes prior to  standing and ambulating to recliner    Transfers Overall transfer level: Needs assistance Equipment used: Rolling walker (2 wheels) Transfers: Sit to/from Stand Sit to Stand: Mod assist, From elevated surface  General transfer comment: pt was able to stand from EOB and from recliner with mod assist + vcs. He elected not to wear TLSO    Ambulation/Gait Ambulation/Gait assistance: Min assist Gait Distance (Feet): 5 Feet Assistive device: Rolling walker (2 wheels) Gait Pattern/deviations: Step-to pattern Gait velocity: decreased  General Gait Details: Pt was easily able to take ~ 5 steps to/from recliner with min assist. struggles moreso with getting OOB and standing then with limited gait. Overall remains pain limited.     Balance Overall balance assessment: Needs assistance Sitting-balance support: Feet supported, Bilateral upper extremity supported Sitting balance-Leahy Scale: Fair     Standing balance support: Bilateral upper extremity supported, During functional activity, Reliant on assistive device for balance Standing balance-Leahy Scale: Fair     Cognition Arousal: Alert Behavior During Therapy: WFL for tasks assessed/performed Overall Cognitive Status: Within Functional Limits for tasks assessed            Pertinent Vitals/Pain Pain Assessment Pain Assessment: No/denies pain Faces Pain Scale: No hurt Pain Location: lower back Pain Descriptors / Indicators: Aching, Discomfort, Grimacing, Guarding, Spasm, Operative site guarding Pain Intervention(s): Limited activity within patient's tolerance, Monitored during session, Premedicated before session, Repositioned     PT Goals (current goals can now be found in the care plan section) Acute Rehab PT Goals Patient Stated Goal: to get better Progress towards PT goals: Progressing toward goals    Frequency    Min 1X/week  Co-evaluation     PT goals addressed during session: Mobility/safety with  mobility        AM-PAC PT "6 Clicks" Mobility   Outcome Measure  Help needed turning from your back to your side while in a flat bed without using bedrails?: A Lot Help needed moving from lying on your back to sitting on the side of a flat bed without using bedrails?: A Lot Help needed moving to and from a bed to a chair (including a wheelchair)?: A Lot Help needed standing up from a chair using your arms (e.g., wheelchair or bedside chair)?: A Lot Help needed to walk in hospital room?: A Lot Help needed climbing 3-5 steps with a railing? : A Lot 6 Click Score: 12    End of Session   Activity Tolerance: Patient tolerated treatment well;Patient limited by pain Patient left: in bed;with call bell/phone within reach;with bed alarm set;with nursing/sitter in room Nurse Communication: Mobility status PT Visit Diagnosis: Unsteadiness on feet (R26.81);Pain;Muscle weakness (generalized) (M62.81);Difficulty in walking, not elsewhere classified (R26.2)     Time: 1027-2536 PT Time Calculation (min) (ACUTE ONLY): 16 min  Charges:    $Therapeutic Activity: 8-22 mins PT General Charges $$ ACUTE PT VISIT: 1 Visit                    Jetta Lout PTA 03/27/23, 4:18 PM

## 2023-03-27 NOTE — Progress Notes (Signed)
Date of Admission:  03/23/2023      ID: Adam Cabrera is a 37 y.o. 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 Active Problems:   Acute back pain with sciatica   Hypertension   Obesity (BMI 30-39.9)   Mucous retention cyst of maxillary sinus   MSSA (methicillin susceptible Staphylococcus aureus) septicemia (HCC)   Psoas abscess, left (HCC)   Thrombocytopenia (HCC)   Fresh blood passed per rectum  Underwent laminectomy and debridement yesterday  Subjective: Pt in bed Out of ICU Drain removed today  Medications:   Chlorhexidine Gluconate Cloth  6 each Topical Daily   gabapentin  300 mg Oral TID   mupirocin ointment  1 Application Nasal BID   nicotine  21 mg Transdermal Daily   senna-docusate  2 tablet Oral BID   sodium chloride flush  3 mL Intravenous Q12H    Objective: Vital signs in last 24 hours: Patient Vitals for the past 24 hrs:  BP Temp Temp src Pulse Resp SpO2  03/27/23 0746 135/85 98.5 F (36.9 C) -- 90 17 99 %  03/27/23 0505 137/84 98.2 F (36.8 C) Oral 84 18 98 %  03/26/23 2243 136/86 98.9 F (37.2 C) Oral 96 18 97 %  03/26/23 2200 (!) 141/88 -- -- (!) 102 15 97 %  03/26/23 2100 (!) 136/96 -- -- 96 19 96 %  03/26/23 2057 -- -- -- 96 19 95 %  03/26/23 2000 (!) 139/95 -- -- (!) 104 14 96 %  03/26/23 1936 -- -- -- (!) 102 15 97 %  03/26/23 1900 136/89 98.6 F (37 C) -- 95 13 98 %  03/26/23 1700 (!) 141/86 -- -- 95 17 94 %  03/26/23 1600 (!) 141/90 -- -- 95 17 96 %  03/26/23 1500 126/87 -- -- 98 16 98 %      PHYSICAL EXAM:  General: Alert, cooperative,   Lungs: Clear to auscultation bilaterally. No Wheezing or Rhonchi. No rales. Heart: Regular rate and rhythm, no murmur, rub or gallop. Abdomen: Soft, non-tender,not distended. Bowel sounds normal. No masses Extremities:edema legs Venous staining both legs rt > left Skin: No rashes or lesions. Or bruising Lymph: Cervical, supraclavicular normal. Neurologic: did not examine in detail  Lab  Results    Latest Ref Rng & Units 03/27/2023    2:40 AM 03/24/2023    6:09 AM 03/23/2023    4:14 PM  CBC  WBC 4.0 - 10.5 K/uL 5.9  9.9  16.6   Hemoglobin 13.0 - 17.0 g/dL 16.1  09.6  04.5   Hematocrit 39.0 - 52.0 % 33.8  35.9  42.5   Platelets 150 - 400 K/uL 144  194  263        Latest Ref Rng & Units 03/27/2023    2:40 AM 03/26/2023    4:54 AM 03/25/2023    9:09 AM  CMP  Glucose 70 - 99 mg/dL 92  409  811   BUN 6 - 20 mg/dL 8  10  10    Creatinine 0.61 - 1.24 mg/dL 9.14  7.82  9.56   Sodium 135 - 145 mmol/L 133  132  128   Potassium 3.5 - 5.1 mmol/L 3.7  2.9  3.6   Chloride 98 - 111 mmol/L 102  101  102   CO2 22 - 32 mmol/L 25  25  21    Calcium 8.9 - 10.3 mg/dL 8.4  8.1  8.1   Total Protein 6.5 - 8.1 g/dL 7.4  Total Bilirubin 0.3 - 1.2 mg/dL 1.1     Alkaline Phos 38 - 126 U/L 70     AST 15 - 41 U/L 15     ALT 0 - 44 U/L 18         Microbiology: 8/18 [facility]- MSSA 8/19 Surgical culture staph aureus 8/21 BC repeated Studies/Results: No results found.   Assessment/Plan: MSSA bacteremia Discitis and osteomyleitis L2-L3 With epidural abscess/phlegmon at the same level S/p laminectomy and debridement Repeat blood culture sent On nafcillin continuous infusion -some discrepancy in the infusion -  Pharmacist addressed the issue Discussed with patient as well 2 d echo no vegetation TEE not needed as it will not change management  B/l psoas abscesses , may consider IR drainage if needed    Chronic Hepaittis C- has not ben treated- will need management as OP   Splenomegaly likely due to portal hypertension likely cirrhosis   IVDA- cannot be sent home with PICC line and IV antibiotic   Discussed the management with the patient and care team ? ?

## 2023-03-27 NOTE — TOC Progression Note (Signed)
Transition of Care [facility]) - Progression Note    Patient Details  Name: Adam Cabrera MRN: [id number] Date of Birth: June 01, 1986  Transition of [facility]) CM/SW Contact  Marlowe Sax, RN Phone Number: 03/27/2023, 11:49 AM  Clinical Narrative:    The patient will remain in the hospital for the 4 weeks of IV ABX unable to go home with PICC line due to HX of IV drug Use, Removed Hemovac on 8/22. Continue incisional wound VAC. Will remove on 8/24  continue antibiotics per ID recommendations.   TOC will continue to follow for assistance with DC planning   Expected Discharge Plan: Home/Self Care Barriers to Discharge: Continued Medical Work up  Expected Discharge Plan and Services                                               Social Determinants of Health (SDOH) Interventions SDOH Screenings   Tobacco Use: High Risk (03/24/2023)    Readmission Risk Interventions     No data to display

## 2023-03-28 DIAGNOSIS — G061 Intraspinal abscess and granuloma: Secondary | ICD-10-CM | POA: Diagnosis not present

## 2023-03-28 DIAGNOSIS — A4101 Sepsis due to Methicillin susceptible Staphylococcus aureus: Secondary | ICD-10-CM | POA: Diagnosis not present

## 2023-03-28 DIAGNOSIS — M4646 Discitis, unspecified, lumbar region: Secondary | ICD-10-CM | POA: Diagnosis not present

## 2023-03-28 DIAGNOSIS — M5440 Lumbago with sciatica, unspecified side: Secondary | ICD-10-CM | POA: Diagnosis not present

## 2023-03-28 DIAGNOSIS — R7881 Bacteremia: Secondary | ICD-10-CM

## 2023-03-28 DIAGNOSIS — K6812 Psoas muscle abscess: Secondary | ICD-10-CM | POA: Diagnosis not present

## 2023-03-28 DIAGNOSIS — K625 Hemorrhage of anus and rectum: Secondary | ICD-10-CM | POA: Diagnosis not present

## 2023-03-28 LAB — BASIC METABOLIC PANEL
Anion gap: 11 (ref 5–15)
BUN: 7 mg/dL (ref 6–20)
CO2: 27 mmol/L (ref 22–32)
Calcium: 9 mg/dL (ref 8.9–10.3)
Chloride: 98 mmol/L (ref 98–111)
Creatinine, Ser: 0.55 mg/dL — ABNORMAL LOW (ref 0.61–1.24)
GFR, Estimated: >60 mL/min (ref >60)
Glucose, Bld: 101 mg/dL — ABNORMAL HIGH (ref 70–99)
Potassium: 4 mmol/L (ref 3.5–5.1)
Sodium: 136 mmol/L (ref 135–145)

## 2023-03-28 LAB — CBC
HCT: 33.1 % — ABNORMAL LOW (ref 39.0–52.0)
Hemoglobin: 11 g/dL — ABNORMAL LOW (ref 13.0–17.0)
MCH: 27.2 pg (ref 26.0–34.0)
MCHC: 33.2 g/dL (ref 30.0–36.0)
MCV: 81.7 fL (ref 80.0–100.0)
Platelets: 158 10*3/uL (ref 150–400)
RBC: 4.05 MIL/uL — ABNORMAL LOW (ref 4.22–5.81)
RDW: 16.1 % — ABNORMAL HIGH (ref 11.5–15.5)
WBC: 6 10*3/uL (ref 4.0–10.5)
nRBC: 0 % (ref 0.0–0.2)

## 2023-03-28 NOTE — Progress Notes (Signed)
Occupational Therapy Treatment Patient Details Name: Adam Cabrera MRN: [id number] DOB: 22-Jan-1986 Today's Date: 03/28/2023   History of present illness 37 y.o. male with medical history significant for hypertension, scrotal cellulitis, history of IV drug use, presents with low back pain.  MRI of the lumbar spine showed Epidural extension with epidural phlegmon/abscess within the ventral epidural space extending from L1-2 through L3-4 and s/p lumbar laminectomy   OT comments  Adam Cabrera was seen for OT treatment on this date. Upon arrival to room pt reclined in bed, agreeable to tx. Pt requires MIN A bed mobility to log roll. MIN A + RW for limited ADL t/f ~12 ft. MIN A for UB and LB bathing in sitting, assist as pt is limited by pain. MAX A doff B socks. Pt making good progress toward goals, will continue to follow POC. Discharge recommendation remains appropriate.        If plan is discharge home, recommend the following:  Two people to help with walking and/or transfers;Two people to help with bathing/dressing/bathroom;Assistance with cooking/housework;Assist for transportation;Help with stairs or ramp for entrance   Equipment Recommendations  Other (comment) (RW)    Recommendations for Other Services      Precautions / Restrictions Precautions Precautions: Fall;Back Required Braces or Orthoses: Spinal Brace Spinal Brace: Thoracolumbosacral orthotic Spinal Brace Comments: for comfort only to be donned EOB. (pt has not wanted to use thus far) Restrictions Weight Bearing Restrictions: No       Mobility Bed Mobility Overal bed mobility: Needs Assistance Bed Mobility: Rolling, Sidelying to Sit, Supine to Sit Rolling: Min assist, Used rails Sidelying to sit: Min assist Supine to sit: Min assist          Transfers Overall transfer level: Needs assistance Equipment used: Rolling walker (2 wheels) Transfers: Sit to/from Stand Sit to Stand: Min assist           General  transfer comment: L knee buckling, pt corrects with use of RW     Balance Overall balance assessment: Needs assistance Sitting-balance support: Feet supported, Bilateral upper extremity supported Sitting balance-Leahy Scale: Good     Standing balance support: Bilateral upper extremity supported, During functional activity, Reliant on assistive device for balance Standing balance-Leahy Scale: Fair                             ADL either performed or assessed with clinical judgement   ADL Overall ADL's : Needs assistance/impaired                                       General ADL Comments: MIN A + RW for limited ADL t/f. MIN A UB and LB bathing in sitting, assist as pt is limited by pain. MAX A doff B socks      Cognition Arousal: Alert Behavior During Therapy: WFL for tasks assessed/performed Overall Cognitive Status: Within Functional Limits for tasks assessed                                                     Pertinent Vitals/ Pain       Pain Assessment Pain Assessment: 0-10 Pain Score: 10-Worst pain ever Pain Location: lower back/sacral pain Pain Descriptors /  Indicators: Aching, Discomfort, Grimacing, Guarding, Spasm, Operative site guarding Pain Intervention(s): Limited activity within patient's tolerance, Patient requesting pain meds-RN notified   Frequency  Min 1X/week        Progress Toward Goals  OT Goals(current goals can now be found in the care plan section)  Progress towards OT goals: Progressing toward goals  Acute Rehab OT Goals Patient Stated Goal: to improve pain OT Goal Formulation: With patient Time For Goal Achievement: 04/08/23 Potential to Achieve Goals: Fair ADL Goals Pt Will Perform Grooming: with min assist;standing Pt Will Perform Lower Body Dressing: with min assist;sit to/from stand Pt Will Transfer to Toilet: with min assist;ambulating Pt Will Perform Toileting - Clothing  Manipulation and hygiene: with min assist;sit to/from stand  Plan      Co-evaluation                 AM-PAC OT "6 Clicks" Daily Activity     Outcome Measure   Help from another person eating meals?: None Help from another person taking care of personal grooming?: A Little Help from another person toileting, which includes using toliet, bedpan, or urinal?: A Lot Help from another person bathing (including washing, rinsing, drying)?: A Little Help from another person to put on and taking off regular upper body clothing?: A Little Help from another person to put on and taking off regular lower body clothing?: A Lot 6 Click Score: 17    End of Session Equipment Utilized During Treatment: Rolling walker (2 wheels)  OT Visit Diagnosis: Unsteadiness on feet (R26.81);Repeated falls (R29.6);Muscle weakness (generalized) (M62.81)   Activity Tolerance Patient limited by pain   Patient Left in bed;with call bell/phone within reach   Nurse Communication          Time: 1610-9604 OT Time Calculation (min): 32 min  Charges: OT General Charges $OT Visit: 1 Visit OT Treatments $Self Care/Home Management : 23-37 mins  Kathie Dike, M.S. OTR/L  03/28/23, 3:15 PM  ascom [phone]

## 2023-03-28 NOTE — H&P (Signed)
Wyline Mood , MD [address], [address], [place], [place], [address] [address], [place], [place], [address] Phone: [phone]  Fax: [phone]  Consultation  Referring Provider:   Dr Chipper Herb Primary Care Physician:  Patient, No Pcp Per Primary Gastroenterologist:  None          Reason for Consultation:     1 episode rectal bleeding   Date of Admission:  03/23/2023 Date of Consultation:  03/28/2023         HPI:   Adam Cabrera is a 37 y.o. male with hiostory of HTN, scrotal cellulitis , IV drug use . Admitted with staph aureus bactermia secondary to lumbar spinal epidural abscess , s/p Washout 03/24/2023.    Yesterday had a large bowel movement with 1 episode of blood per rectum . Hb stable at 11 grams.  He said that he only had one bowel movement which was very large and after a few days of his last bowel movement noticed small qty of blood and none since. No similar issues in the past , denies any abdominal pain. Has difficulty with mobilization and going to rest room.   Past Medical History:  Diagnosis Date   Cellulitis     Past Surgical History:  Procedure Laterality Date   LUMBAR LAMINECTOMY FOR EPIDURAL ABSCESS N/A 03/24/2023   Procedure: LUMBAR LAMINECTOMY L2-3 FOR ABSCESS;  Surgeon: Lovenia Kim, MD;  Location: [facility] ORS;  Service: Neurosurgery;  Laterality: N/A;    Prior to Admission medications   Medication Sig Start Date End Date Taking? Authorizing Provider  cyclobenzaprine (FLEXERIL) 10 MG tablet Take 10 mg by mouth 3 (three) times daily as needed for muscle spasms.   Yes [provider]  traMADol (ULTRAM) 50 MG tablet Take 50-100 mg by mouth every 6 (six) hours as needed for moderate pain or severe pain.   Yes [provider]    History reviewed. No pertinent family history.   Social History   Tobacco Use   Smoking status: Every Day    Types: Cigarettes   Smokeless tobacco: Never  Vaping Use   Vaping status: Never Used   Substance Use Topics   Alcohol use: Yes    Comment: socially    Drug use: Not Currently    Allergies as of 03/23/2023   (No Known Allergies)    Review of Systems:    All systems reviewed and negative except where noted in HPI.   Physical Exam:  Vital signs in last 24 hours: Temp:  [98.2 F (36.8 C)-98.7 F (37.1 C)] 98.4 F (36.9 C) (08/23 0858) Pulse Rate:  [87-105] 89 (08/23 0858) Resp:  [16-18] 16 (08/23 0858) BP: (127-138)/(82-98) 127/91 (08/23 0858) SpO2:  [95 %-99 %] 95 % (08/23 0858) Last BM Date : 03/27/23 General:   Pleasant, cooperative in NAD Head:  Normocephalic and atraumatic. Eyes:   No icterus.   Conjunctiva pink. PERRLA. Ears:  Normal auditory acuity. Neck:  Supple; no masses or thyroidomegaly Abdomen:  Soft, nondistended, nontender. Normal bowel sounds. No appreciable masses or hepatomegaly.  No rebound or guarding.  Skin:  Intact without significant lesions or rashes. Cervical Nodes:  No significant cervical adenopathy. Psych:  Alert and cooperative. Normal affect.  LAB RESULTS: Recent Labs    03/27/23 0240 03/28/23 0523  WBC 5.9 6.0  HGB 11.0* 11.0*  HCT 33.8* 33.1*  PLT 144* 158   BMET Recent Labs    03/26/23 0454 03/27/23 0240 03/28/23 0523  NA  132* 133* 136  K 2.9* 3.7 4.0  CL 101 102 98  CO2 25 25 27   GLUCOSE 104* 92 101*  BUN 10 8 7   CREATININE 0.46* 0.43* 0.55*  CALCIUM 8.1* 8.4* 9.0   LFT Recent Labs    03/27/23 0240  PROT 7.4  ALBUMIN 3.0*  AST 15  ALT 18  ALKPHOS 70  BILITOT 1.1   PT/INR No results for input(s): "LABPROT", "INR" in the last 72 hours.  STUDIES: No results found.    Impression / Plan:   Adam Cabrera is a 37 y.o. y/o male with a history of epidural abscess/osteomyelitis s/p washout, stap aureus bactermia , B/l psoas abscess , chronic hep C needing outpatient treatment had 1 episode of rectal bleeding after a large bowel movement . On oxycodone.   Likely bleeding from constipation/hemorrhoids-  would recommend non colonoscopy when he is doing better overall ideally outpatient  , at this time is having difficultuy with mobilizing and would also prefer to have it done when he is more ambulatory , if has recurrent episodes may have to do it inpatient . In meanwhile ensure on laxatives with opiods,   . I will sign off.  Please call me if any further GI concerns or questions.  We would like to thank you for the opportunity to participate in the care of Adam Cabrera.   Thank you for involving me in the care of this patient.      LOS: 5 days   Wyline Mood, MD  03/28/2023, 11:06 AM

## 2023-03-28 NOTE — Progress Notes (Signed)
Progress Note   Patient: Adam Cabrera:[id number] DOB: [birth year] DOA: 03/23/2023     5 DOS: the patient was seen and examined on 03/28/2023   Brief hospital course:  Adam Cabrera is a 37 y.o. male with medical history significant for hypertension, scrotal cellulitis, history of IV drug use, presents with low back pain.  MRI of the lumbar spine showed Epidural extension with epidural phlegmon/abscess within the ventral epidural space extending from L1-2 through L3-4.  Blood culture positive for Staph aureus, patient treated with cefepime and vancomycin.  Patient has been seen by ID and neurosurgery, L spine washout performed 8/19.      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 MSSA septicemia. Acute back pain with sciatica Patient met sepsis criteria at time of admission with heart rate 144, respiratory rate 26, this is secondary to epidural abscess. Patient has been seen by neurosurgery, epidural washout performed 8/19. Consult from ID is obtained.  Antibiotic changed to cefazolin. Repeat blood culture sent out 8/21, echocardiogram showed ejection fraction 65 to 70%, no vegetation identified, no valvular abnormality. Discussed with ID, most likely will treat the hospital for 4 weeks with IV antibiotics, followed with oral antibiotics. Patient still have severe back pain with numbing tingling in the lower extremity.  Discussed with neurosurgery, this is expected from decompression of the spinal cord. Patient seems to be better today, able to sit up and walk a few steps.  Continue symptomatic treatment, continue IV  antibiotics.   Fresh red blood per rectum. Thrombocytopenia. Patient developed a mild thrombocytopenia today, this is probably due to sepsis.  Will continue to follow. Patient also had an episode of fresh red blood per rectum 8/22, this is after large bowel movement.  Patient has constipation, received a dose of lactulose. Rectal bleeding most likely due to internal hemorrhoids versus diverticulosis.  GI consult obtained.   Patient hemoglobin has been stable, does not seem to have additional episodes. Recheck a CBC tomorrow.   Hypokalemia. Hyponatremia. Potassium has normalized.   Obesity (BMI 30-39.9) Diet and exercise.     Hypertension Continue to monitor      Subjective:  Patient doing better today, pain seems to be better.  No additional rectal bleeding.  Physical Exam: Vitals:   03/27/23 0746 03/27/23 1506 03/28/23 0019 03/28/23 0858  BP: 135/85 (!) 137/98 138/82 (!) 127/91  Pulse: 90 (!) 105 87 89  Resp: 17 16 18 16   Temp: 98.5 F (36.9 C) 98.7 F (37.1 C) 98.2 F (36.8 C) 98.4 F (36.9 C)  TempSrc:  Oral Oral   SpO2: 99% 99% 98% 95%  Weight:      Height:       General exam: Appears calm and comfortable  Respiratory system: Clear to auscultation. Respiratory effort normal. Cardiovascular system: S1 & S2 heard, RRR. No JVD, murmurs, rubs, gallops or clicks. No pedal edema. Gastrointestinal system: Abdomen is nondistended, soft and nontender. No organomegaly or masses felt. Normal bowel sounds heard. Central nervous system: Alert and oriented. No focal neurological deficits. Extremities: Symmetric 5 x 5 power. Skin: No rashes, lesions or ulcers Psychiatry: Judgement  and insight appear normal. Mood & affect appropriate.    Data Reviewed:  Lab results reviewed.  Family Communication: None  Disposition: Status is: Inpatient Remains inpatient appropriate because: Severity of disease, IV treatment.     Time spent: 35 minutes  Author: Marrion Coy,  MD 03/28/2023 12:26 PM  For on call review [email or web address].

## 2023-03-28 NOTE — Progress Notes (Signed)
   Neurosurgery Progress Note  History: Adam Cabrera is s/p L2-3 decompression for epidural phlegmon.  POD4: Improved pain this morning POD3: NAEO POD2: continued back pain. Improved with some medication changes.  POD1: improved leg pain and strength overnight.  Physical Exam: Vitals:   03/27/23 1506 03/28/23 0019  BP: (!) 137/98 138/82  Pulse: (!) 105 87  Resp: 16 18  Temp: 98.7 F (37.1 C) 98.2 F (36.8 C)  SpO2: 99% 98%    AA Ox3 CNI  Strength:4/5 throughout BLE  Incision covered with incisional wound VAC  Data:  Other tests/results:  Cultures positive for abundant Staph aureus.  Sensitivities pending. Gram stain showing gram positive cocci  Assessment/Plan:  Adam Cabrera is a 37 year old presenting with an L2-3 abscess and right greater than left lower extremity weakness status post L2-3 decompression for evacuation of epidural phlegmon.  - mobilize - pain control - DVT prophylaxis - PT -Removed Hemovac on 8/22. Continue incisional wound VAC. Will remove on 8/24 - continue antibiotics per ID recommendations.   Manning Charity PA-C Department of Neurosurgery

## 2023-03-28 NOTE — Progress Notes (Signed)
Date of Admission:  03/23/2023      ID: Adam Cabrera is a 37 y.o. 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 Active Problems:   Acute back pain with sciatica   Hypertension   Obesity (BMI 30-39.9)   Mucous retention cyst of maxillary sinus   MSSA (methicillin susceptible Staphylococcus aureus) septicemia (HCC)   Psoas abscess, left (HCC)   Thrombocytopenia (HCC)   Fresh blood passed per rectum  Underwent laminectomy and debridement yesterday  Subjective: Pt says he worked with PT , walked in the room, sat in the chair for 2 hours Pain better controlled  Medications:   Chlorhexidine Gluconate Cloth  6 each Topical Daily   gabapentin  300 mg Oral TID   mupirocin ointment  1 Application Nasal BID   nicotine  21 mg Transdermal Daily   senna-docusate  2 tablet Oral BID   sodium chloride flush  3 mL Intravenous Q12H    Objective: Vital signs in last 24 hours: Patient Vitals for the past 24 hrs:  BP Temp Temp src Pulse Resp SpO2  03/28/23 1648 131/88 98 F (36.7 C) Oral 94 17 100 %  03/28/23 0858 (!) 127/91 98.4 F (36.9 C) Oral 89 16 95 %  03/28/23 0019 138/82 98.2 F (36.8 C) Oral 87 18 98 %      PHYSICAL EXAM:  General: Alert, no distress  Lungs: Clear to auscultation bilaterally. No Wheezing or Rhonchi. No rales. Heart: Regular rate and rhythm, no murmur, rub or gallop. Abdomen: Soft, non-tender,not distended. Bowel sounds normal. No masses Extremities:edema legs Venous staining both legs rt > left Skin: No rashes or lesions. Or bruising Lymph: Cervical, supraclavicular normal. Neurologic: did not examine in detail  Lab Results    Latest Ref Rng & Units 03/28/2023    5:23 AM 03/27/2023    2:40 AM 03/24/2023    6:09 AM  CBC  WBC 4.0 - 10.5 K/uL 6.0  5.9  9.9   Hemoglobin 13.0 - 17.0 g/dL 16.1  09.6  04.5   Hematocrit 39.0 - 52.0 % 33.1  33.8  35.9   Platelets 150 - 400 K/uL 158  144  194        Latest Ref Rng & Units 03/28/2023    5:23 AM  03/27/2023    2:40 AM 03/26/2023    4:54 AM  CMP  Glucose 70 - 99 mg/dL 409  92  811   BUN 6 - 20 mg/dL 7  8  10    Creatinine 0.61 - 1.24 mg/dL 9.14  7.82  9.56   Sodium 135 - 145 mmol/L 136  133  132   Potassium 3.5 - 5.1 mmol/L 4.0  3.7  2.9   Chloride 98 - 111 mmol/L 98  102  101   CO2 22 - 32 mmol/L 27  25  25    Calcium 8.9 - 10.3 mg/dL 9.0  8.4  8.1   Total Protein 6.5 - 8.1 g/dL  7.4    Total Bilirubin 0.3 - 1.2 mg/dL  1.1    Alkaline Phos 38 - 126 U/L  70    AST 15 - 41 U/L  15    ALT 0 - 44 U/L  18        Microbiology: 8/18 BC- MSSA 8/19 Surgical culture staph aureus 8/21 BC repeated Studies/Results: No results found.   Assessment/Plan: MSSA bacteremia Discitis and osteomyleitis L2-L3 With epidural abscess/phlegmon at the same level S/p laminectomy and debridement Repeat blood culture  sent On nafcillin continuous infusion -will need for 6 weeks 2 d echo no vegetation TEE not needed as it will not change management  B/l psoas abscesses , may consider IR drainage if needed    Chronic Hepaittis C- has not ben treated- will need management as OP   Splenomegaly likely due to portal hypertension likely cirrhosis   IVDA- cannot be sent home with PICC line and IV antibiotic   Discussed the management with the patient and care team ? [facility] on call this weekend- available by phone for urgent issues?

## 2023-03-28 NOTE — Plan of Care (Signed)

## 2023-03-28 NOTE — Progress Notes (Signed)
Physical Therapy Treatment Patient Details Name: Adam Cabrera MRN: [id number] DOB: [birth year] Today's Date: 03/28/2023   History of Present Illness 37 y.o. male with medical history significant for hypertension, scrotal cellulitis, history of IV drug use, presents with low back pain.  MRI of the lumbar spine showed Epidural extension with epidural phlegmon/abscess within the ventral epidural space extending from L1-2 through L3-4 and s/p lumbar laminectomy    PT Comments  Pt was supine in bed upon arriving. Endorses minimal pain at rest that quickly elevated to 9/10 pain with movements. Pt was cooperative and pleasant. Performed log roll R to short sit with increased time and min-mod assist. Stood EOB and tolerated gait to doorway of room. Pt tends to have narrow BOS and heavy reliance on RW. Slight knee buckling noted but no intervention required. At conclusion of session, pt was sitting in recliner with call bell in reach. Acute PT will continue to follow and progress per current POC.    If plan is discharge home, recommend the following: A lot of help with walking and/or transfers;A lot of help with bathing/dressing/bathroom;Assistance with cooking/housework;Direct supervision/assist for medications management;Direct supervision/assist for financial management;Assist for transportation;Help with stairs or ramp for entrance     Equipment Recommendations  Other (comment) (ongoing assessment. Will make more formal DME recs once closer to DC date.)       Precautions / Restrictions Precautions Precautions: Fall;Back Required Braces or Orthoses: Spinal Brace Spinal Brace: Thoracolumbosacral orthotic Spinal Brace Comments: for comfort only to be donned EOB. (pt has not wanted to use thus far)     Mobility  Bed Mobility Overal bed mobility: Needs Assistance Bed Mobility: Rolling, Sidelying to Sit, Supine to Sit Rolling: Min assist, Used rails Sidelying to sit: Min assist, Mod assist Supine to  sit: Min assist, Mod assist  General bed mobility comments: Increased time required + heavy use of bed rail. Rolled R to short sit    Transfers Overall transfer level: Needs assistance Equipment used: Rolling walker (2 wheels) Transfers: Sit to/from Stand Sit to Stand: Min assist, Mod assist, From elevated surface  General transfer comment: Pt was able to stand EOB with bed height elevated with min-mod assist of one. pt endorses legs feel weak however was able to ambulate today. Pt overall remains pain limited.    Ambulation/Gait Ambulation/Gait assistance: Contact guard assist, Min assist Gait Distance (Feet): 25 Feet Assistive device: Rolling walker (2 wheels) Gait Pattern/deviations: Narrow base of support, Trunk flexed Gait velocity: decreased  General Gait Details: Heavy reliance on UEs on RW. pt was able to ambulate to doorway of room. Tends to have narrow BOS and flexed posture. Minimal knee buckling observed but no intervention required.    Balance Overall balance assessment: Needs assistance Sitting-balance support: Feet supported, Bilateral upper extremity supported Sitting balance-Leahy Scale: Good     Standing balance support: Bilateral upper extremity supported, During functional activity, Reliant on assistive device for balance Standing balance-Leahy Scale: Fair       Cognition Arousal: Alert Behavior During Therapy: WFL for tasks assessed/performed Overall Cognitive Status: Within Functional Limits for tasks assessed    General Comments: pt was alert and agreeable to session. fully participates but remains limited by pain.               Pertinent Vitals/Pain Pain Assessment Pain Assessment: 0-10 Pain Score: 9  Pain Location: lower back/sacral pain Pain Descriptors / Indicators: Aching, Discomfort, Grimacing, Guarding, Spasm, Operative site guarding Pain Intervention(s): Limited activity within patient's  tolerance, Monitored during session, Premedicated  before session, Repositioned     PT Goals (current goals can now be found in the care plan section) Acute Rehab PT Goals Patient Stated Goal: less pain Progress towards PT goals: Progressing toward goals    Frequency    Min 1X/week       Co-evaluation     PT goals addressed during session: Mobility/safety with mobility        AM-PAC PT "6 Clicks" Mobility   Outcome Measure  Help needed turning from your back to your side while in a flat bed without using bedrails?: A Lot Help needed moving from lying on your back to sitting on the side of a flat bed without using bedrails?: A Lot Help needed moving to and from a bed to a chair (including a wheelchair)?: A Lot Help needed standing up from a chair using your arms (e.g., wheelchair or bedside chair)?: A Lot Help needed to walk in hospital room?: A Lot Help needed climbing 3-5 steps with a railing? : A Lot 6 Click Score: 12    End of Session   Activity Tolerance: Patient tolerated treatment well;Patient limited by pain Patient left: in chair;with call bell/phone within reach;with chair alarm set Nurse Communication: Mobility status PT Visit Diagnosis: Unsteadiness on feet (R26.81);Pain;Muscle weakness (generalized) (M62.81);Difficulty in walking, not elsewhere classified (R26.2)     Time: 0626-9485 PT Time Calculation (min) (ACUTE ONLY): 21 min  Charges:    $Gait Training: 8-22 mins PT General Charges $$ ACUTE PT VISIT: 1 Visit                     Jetta Lout PTA 03/28/23, 10:11 AM

## 2023-03-29 DIAGNOSIS — G061 Intraspinal abscess and granuloma: Secondary | ICD-10-CM | POA: Diagnosis not present

## 2023-03-29 DIAGNOSIS — M5440 Lumbago with sciatica, unspecified side: Secondary | ICD-10-CM | POA: Diagnosis not present

## 2023-03-29 DIAGNOSIS — A4101 Sepsis due to Methicillin susceptible Staphylococcus aureus: Secondary | ICD-10-CM | POA: Diagnosis not present

## 2023-03-29 LAB — AEROBIC/ANAEROBIC CULTURE W GRAM STAIN (SURGICAL/DEEP WOUND): Gram Stain: NONE SEEN

## 2023-03-29 LAB — CBC
HCT: 34.2 % — ABNORMAL LOW (ref 39.0–52.0)
Hemoglobin: 10.8 g/dL — ABNORMAL LOW (ref 13.0–17.0)
MCH: 26.9 pg (ref 26.0–34.0)
MCHC: 31.6 g/dL (ref 30.0–36.0)
MCV: 85.1 fL (ref 80.0–100.0)
Platelets: 156 10*3/uL (ref 150–400)
RBC: 4.02 MIL/uL — ABNORMAL LOW (ref 4.22–5.81)
RDW: 16.4 % — ABNORMAL HIGH (ref 11.5–15.5)
WBC: 5.2 10*3/uL (ref 4.0–10.5)
nRBC: 0 % (ref 0.0–0.2)

## 2023-03-29 MED ORDER — TRAZODONE HCL 50 MG PO TABS
50.0000 mg | ORAL_TABLET | Freq: Every day | ORAL | Status: DC
  Administered 2023-03-29 – 2023-05-12 (×45): 50 mg via ORAL
  Filled 2023-03-29 (×45): qty 1

## 2023-03-29 MED ORDER — HYDROCORTISONE ACETATE 25 MG RE SUPP
25.0000 mg | Freq: Two times a day (BID) | RECTAL | Status: DC
  Administered 2023-03-30 – 2023-04-02 (×5): 25 mg via RECTAL
  Filled 2023-03-29 (×9): qty 1

## 2023-03-29 MED ORDER — LACTULOSE 10 GM/15ML PO SOLN
20.0000 g | Freq: Once | ORAL | Status: AC
  Administered 2023-03-29: 20 g via ORAL
  Filled 2023-03-29: qty 30

## 2023-03-29 NOTE — Progress Notes (Signed)
Physical Therapy Treatment Patient Details Name: Adam Cabrera MRN: [id number] DOB: [birth year] Today's Date: 03/29/2023   History of Present Illness 37 y.o. male with medical history significant for hypertension, scrotal cellulitis, history of IV drug use, presents with low back pain.  MRI of the lumbar spine showed Epidural extension with epidural phlegmon/abscess within the ventral epidural space extending from L1-2 through L3-4 and s/p lumbar laminectomy    PT Comments  Pt is steadily progressing towards PT goals. Pt reports low back pain 8/10 NPS at beginning of session, adding "doctors just came and removed my drains and that's why I'm hurting". Pt cont to be motivated and cooperative with mobility as seen by x1 attempt sitting EOB but was limited by pain. Pt able to roll on R side min-mod A for support. Pt performed BLE bed exercises to promote strength and reduce B knee buckling noted during amb in prior session. At end of session, Pt reported pain at 9/10 NPS but was slowly decreasing with rest. PT will continue to focus on BLE strengthening and amb in future sessions. Pt would benefit from cont skilled PT to address above deficits and promote optimal return to PLOF.     If plan is discharge home, recommend the following: A lot of help with walking and/or transfers;A lot of help with bathing/dressing/bathroom;Assistance with cooking/housework;Direct supervision/assist for medications management;Direct supervision/assist for financial management;Assist for transportation;Help with stairs or ramp for entrance   Can travel by private vehicle        Equipment Recommendations  Other (comment) (TBD at this time)    Recommendations for Other Services       Precautions / Restrictions Precautions Precautions: Fall;Back Required Braces or Orthoses: Spinal Brace Spinal Brace: Thoracolumbosacral orthotic Spinal Brace Comments: for comfort only to be donned EOB. (pt has not wanted to use thus  far) Restrictions Weight Bearing Restrictions: No     Mobility  Bed Mobility Overal bed mobility: Needs Assistance Bed Mobility: Rolling Rolling: Min assist, Used rails         General bed mobility comments: Increased time required + heavy use of bed rail to roll on R side. Unable to sit EOB due to increased pain    Transfers                   General transfer comment: NT due to pain    Ambulation/Gait               General Gait Details: NT due to pain   Stairs             Wheelchair Mobility     Tilt Bed    Modified Rankin (Stroke Patients Only)       Balance       Sitting balance - Comments: Not able to assess during today's session due to limitation of pain during bed mobility       Standing balance comment: Not able to assess during today's session due to limitation of pain during bed mobility                            Cognition Arousal: Alert Behavior During Therapy: [facility] for tasks assessed/performed Overall Cognitive Status: Within Functional Limits for tasks assessed                                 General Comments: pt was  alert and agreeable to session but remains limited by pain        Exercises Other Exercises Other Exercises: 2x10 ea LE quad sets, 2x10 ea LE ankle pumps, x10 ea LE heel slides    General Comments        Pertinent Vitals/Pain Pain Assessment Pain Assessment: 0-10 Pain Score: 8  Pain Location: lower back/sacral pain Pain Descriptors / Indicators: Aching, Discomfort, Grimacing, Guarding, Operative site guarding, Sore Pain Intervention(s): Monitored during session, Limited activity within patient's tolerance    Home Living                          Prior Function            PT Goals (current goals can now be found in the care plan section) Acute Rehab PT Goals Patient Stated Goal: less pain PT Goal Formulation: With patient Time For Goal Achievement:  04/08/23 Potential to Achieve Goals: Good Progress towards PT goals: Progressing toward goals    Frequency    Min 1X/week      PT Plan      Co-evaluation              AM-PAC PT "6 Clicks" Mobility   Outcome Measure  Help needed turning from your back to your side while in a flat bed without using bedrails?: A Lot Help needed moving from lying on your back to sitting on the side of a flat bed without using bedrails?: A Lot Help needed moving to and from a bed to a chair (including a wheelchair)?: A Lot Help needed standing up from a chair using your arms (e.g., wheelchair or bedside chair)?: A Lot Help needed to walk in hospital room?: A Lot Help needed climbing 3-5 steps with a railing? : Total 6 Click Score: 11    End of Session   Activity Tolerance: Patient tolerated treatment well;Patient limited by fatigue Patient left: in bed;with call bell/phone within reach;with bed alarm set Nurse Communication: Mobility status PT Visit Diagnosis: Unsteadiness on feet (R26.81);Pain;Muscle weakness (generalized) (M62.81);Difficulty in walking, not elsewhere classified (R26.2) Pain - Right/Left:  (L>R low back, sacral area) Pain - part of body: Hip (back)     Time: 1914-7829 PT Time Calculation (min) (ACUTE ONLY): 15 min  Charges:                            Elmon Else, SPT    Efren Kross 03/29/2023, 1:33 PM

## 2023-03-29 NOTE — Plan of Care (Signed)

## 2023-03-29 NOTE — Progress Notes (Signed)
Neurosurgery Progress Note   History: Adam Cabrera is s/p L2-3 decompression for epidural phlegmon.  POD5:  improved pain  POD4: Improved pain this morning POD3: NAEO POD2: continued back pain. Improved with some medication changes.  POD1: improved leg pain and strength overnight.   Physical Exam:    AA Ox3 CNI   Strength:4/5 throughout BLE  Incision covered with incisional wound VAC   Data:   Other tests/results:  Cultures positive for abundant Staph aureus.  Sensitivities pending. Gram stain showing gram positive cocci   Assessment/Plan:   Adam Cabrera is a 37 year old presenting with an L2-3 abscess and right greater than left lower extremity weakness status post L2-3 decompression for evacuation of epidural phlegmon.   - mobilize - pain control - DVT prophylaxis - PT -Wound vac removed and dressing placed. - continue antibiotics per ID recommendations.    Karenann Cai MD, PhD

## 2023-03-29 NOTE — Progress Notes (Signed)
Progress Note   Patient: Adam Cabrera LOV:[id number] DOB: 11/13/1985 DOA: 03/23/2023     6 DOS: the patient was seen and examined on 03/29/2023   Brief hospital course:  Adam Cabrera is a 37 y.o. male with medical history significant for hypertension, scrotal cellulitis, history of IV drug use, presents with low back pain.  MRI of the lumbar spine showed Epidural extension with epidural phlegmon/abscess within the ventral epidural space extending from L1-2 through L3-4.  Blood culture positive for Staph aureus, patient treated with cefepime and vancomycin.  Patient has been seen by ID and neurosurgery, L spine washout performed 8/19.      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 MSSA septicemia. Acute back pain with sciatica Patient met sepsis criteria at time of admission with heart rate 144, respiratory rate 26, this is secondary to epidural abscess. Patient has been seen by neurosurgery, epidural washout performed 8/19. Consult from ID is obtained.  Antibiotic changed to cefazolin. Repeat blood culture sent out 8/21, echocardiogram showed ejection fraction 65 to 70%, no vegetation identified, no valvular abnormality. Discussed with ID, most likely will treat the hospital for 4 weeks with IV antibiotics, followed with oral antibiotics. Patient still have severe back pain with numbing tingling in the lower extremity.  Discussed with neurosurgery, this is expected from decompression of the spinal cord. Repeated blood culture came back negative.  Wound culture positive for Staph aureus. Continue current  antibiotics with cefazolin.   Fresh red blood per rectum. Thrombocytopenia. Patient developed a mild thrombocytopenia today, this is probably due to sepsis.  Will continue to follow. Patient also had an episode of fresh red blood per rectum 8/22, this is after large bowel movement.   Platelet count has normalized, patient does not have additional episodes of rectal bleeding. He became constipated again, continue senna, give another dose of lactulose.   Hypokalemia. Hyponatremia. Sodium and potassium are normalized.   Obesity (BMI 30-39.9) Diet and exercise.     Hypertension Continue to monitor       Subjective:  Patient back pain seems to be better.  Still has some tingling in the legs. He is now constipated, no additional rectal bleeding.  Physical Exam: Vitals:   03/28/23 0858 03/28/23 1648 03/28/23 2326 03/29/23 0810  BP: (!) 127/91 131/88 (!) 140/94 (!) 135/92  Pulse: 89 94 87 97  Resp: 16 17 20 16   Temp: 98.4 F (36.9 C) 98 F (36.7 C) 98.4 F (36.9 C) 98.2 F (36.8 C)  TempSrc: Oral Oral Oral Oral  SpO2: 95% 100% 98% 96%  Weight:      Height:       General exam: Appears calm and comfortable  Respiratory system: Clear to auscultation. Respiratory effort normal. Cardiovascular system: S1 & S2 heard, RRR. No JVD, murmurs, rubs, gallops or clicks. No pedal edema. Gastrointestinal system: Abdomen is nondistended, soft and nontender. No organomegaly or masses felt. Normal bowel sounds heard. Central nervous system: Alert and oriented. No focal neurological deficits. Extremities: Symmetric 5 x 5 power. Skin: No rashes, lesions or ulcers Psychiatry: Judgement and insight  appear normal. Mood & affect appropriate.    Data Reviewed:  Lab results reviewed.  Family Communication: None  Disposition: Status is: Inpatient Remains inpatient appropriate because: Severity of disease, IV treatment.     Time spent: 35 minutes  Author: Marrion Coy, MD 03/29/2023  11:25 AM  For on call review [email or web address].

## 2023-03-30 DIAGNOSIS — R7881 Bacteremia: Secondary | ICD-10-CM | POA: Diagnosis not present

## 2023-03-30 DIAGNOSIS — G061 Intraspinal abscess and granuloma: Secondary | ICD-10-CM | POA: Diagnosis not present

## 2023-03-30 DIAGNOSIS — M5440 Lumbago with sciatica, unspecified side: Secondary | ICD-10-CM | POA: Diagnosis not present

## 2023-03-30 DIAGNOSIS — K625 Hemorrhage of anus and rectum: Secondary | ICD-10-CM | POA: Diagnosis not present

## 2023-03-30 NOTE — Progress Notes (Signed)
Physical Therapy Treatment Patient Details Name: Adam Cabrera MRN: [id number] DOB: 04/25/1986 Today's Date: 03/30/2023   History of Present Illness 37 y.o. male with medical history significant for hypertension, scrotal cellulitis, history of IV drug use, presents with low back pain.  MRI of the lumbar spine showed Epidural extension with epidural phlegmon/abscess within the ventral epidural space extending from L1-2 through L3-4 and s/p lumbar laminectomy    PT Comments  Pt is demonstrating good progress towards PT goals. Pt is received in bed, he is agreeable to PT session. At beginning of session, Pt reports low back/sacral pain a 7/10 NPS but reports pain subsides with rest and min movement. Pt performs bed mobility min A, transfers and amb min A x2. Pt able to amb approx 25 ft using RW with cuing to maintain RW close to trunk and maintain an upright posture. Unable to increase amb distance at this time due to pain and fatigue. Pt reports legs "feel like jell-o" ahead of amb and demonstrates BLE weakness but steadily improving. Pt cont to be motivated and cooperative during session amidst levels of pain. Pt would benefit from cont skilled PT to address above deficits and promote optimal return to PLOF.    If plan is discharge home, recommend the following: A lot of help with bathing/dressing/bathroom;Assist for transportation;Help with stairs or ramp for entrance;A lot of help with walking and/or transfers;Assistance with cooking/housework   Can travel by private vehicle        Equipment Recommendations  Other (comment) (TBD at this time)    Recommendations for Other Services       Precautions / Restrictions Precautions Precautions: Fall;Back Required Braces or Orthoses: Spinal Brace Spinal Brace: Thoracolumbosacral orthotic Spinal Brace Comments: for comfort only to be donned EOB. (pt has not wanted to use thus far) Restrictions Weight Bearing Restrictions: No     Mobility  Bed  Mobility Overal bed mobility: Needs Assistance Bed Mobility: Rolling, Sidelying to Sit, Sit to Sidelying Rolling: Min assist, Used rails Sidelying to sit: Min assist, HOB elevated, Used rails     Sit to sidelying: Min assist, HOB elevated, Used rails General bed mobility comments: Increased time required + heavy use of bed rail to roll on R side Patient Response: Anxious, Cooperative  Transfers Overall transfer level: Needs assistance Equipment used: Rolling walker (2 wheels) Transfers: Sit to/from Stand Sit to Stand: From elevated surface, Min assist, +2 physical assistance           General transfer comment: Able to initiate STS but requires min A x2 for completion of task    Ambulation/Gait Ambulation/Gait assistance: Min assist, +2 safety/equipment Gait Distance (Feet): 25 Feet Assistive device: Rolling walker (2 wheels) Gait Pattern/deviations: Trunk flexed, Step-through pattern, Decreased step length - right, Decreased step length - left, Decreased stride length Gait velocity: decreased     General Gait Details: Cuing to maintain RW close to trunk during amb and encouraged to look up to reduce fw flex posture during amb   Stairs             Wheelchair Mobility     Tilt Bed Tilt Bed Patient Response: Anxious, Cooperative  Modified Rankin (Stroke Patients Only)       Balance Overall balance assessment: Needs assistance Sitting-balance support: Feet supported, Bilateral upper extremity supported Sitting balance-Leahy Scale: Good Sitting balance - Comments: Able to maintain seated EOB balance with heavy reliance on BUE to reduce back discomfort   Standing balance support: Bilateral upper extremity  supported, During functional activity, Reliant on assistive device for balance Standing balance-Leahy Scale: Fair Standing balance comment: Able to maintain static standing balance during functional activities with cuing to promote upright posture                             Cognition Arousal: Alert Behavior During Therapy: WFL for tasks assessed/performed Overall Cognitive Status: Within Functional Limits for tasks assessed                                 General Comments: pt was alert and agreeable to session but remains limited by pain        Exercises      General Comments        Pertinent Vitals/Pain Pain Assessment Pain Assessment: 0-10 Pain Score: 7  Pain Location: lower back/sacral pain Pain Descriptors / Indicators: Aching, Discomfort, Grimacing, Guarding, Operative site guarding, Sore Pain Intervention(s): Monitored during session, Premedicated before session    Home Living                          Prior Function            PT Goals (current goals can now be found in the care plan section) Acute Rehab PT Goals Patient Stated Goal: less pain PT Goal Formulation: With patient Time For Goal Achievement: 04/08/23 Potential to Achieve Goals: Good Progress towards PT goals: Progressing toward goals    Frequency    Min 1X/week      PT Plan      Co-evaluation              AM-PAC PT "6 Clicks" Mobility   Outcome Measure  Help needed turning from your back to your side while in a flat bed without using bedrails?: A Little Help needed moving from lying on your back to sitting on the side of a flat bed without using bedrails?: A Little Help needed moving to and from a bed to a chair (including a wheelchair)?: A Lot Help needed standing up from a chair using your arms (e.g., wheelchair or bedside chair)?: A Lot Help needed to walk in hospital room?: A Lot Help needed climbing 3-5 steps with a railing? : Total 6 Click Score: 13    End of Session Equipment Utilized During Treatment: Gait belt Activity Tolerance: Patient tolerated treatment well;Patient limited by pain Patient left: in bed;with call bell/phone within reach;with bed alarm set Nurse Communication:  Mobility status PT Visit Diagnosis: Unsteadiness on feet (R26.81);Pain;Muscle weakness (generalized) (M62.81);Difficulty in walking, not elsewhere classified (R26.2) Pain - Right/Left:  (bilateral) Pain - part of body:  (back)     Time: 0981-1914 PT Time Calculation (min) (ACUTE ONLY): 21 min  Charges:                            Elmon Else, SPT    Satya Buttram 03/30/2023, 12:19 PM

## 2023-03-30 NOTE — Plan of Care (Signed)
Progressing

## 2023-03-30 NOTE — Plan of Care (Signed)

## 2023-03-30 NOTE — Progress Notes (Signed)
Neurosurgery Progress Note   History: Adam Cabrera is s/p L2-3 decompression for epidural phlegmon.  POD 5:  able to walk with   POD5:  improved pain  POD4: Improved pain this morning POD3: NAEO POD2: continued back pain. Improved with some medication changes.  POD1: improved leg pain and strength overnight.   Physical Exam:     AA Ox3 CNI   Strength:4/5 throughout BLE  Incision covered with incisional wound VAC   Data:   Other tests/results:  Cultures positive for abundant Staph aureus.  Sensitivities pending. Gram stain showing gram positive cocci   Assessment/Plan:   Adam Cabrera is a 37 year old presenting with an L2-3 abscess and right greater Adam left lower extremity weakness status post L2-3 decompression for evacuation of epidural phlegmon.   - mobilize - pain control - DVT prophylaxis - PT -Wound vac removed and dressing placed. - continue antibiotics per ID recommendations.    Karenann Cai MD, PhD

## 2023-03-30 NOTE — Progress Notes (Signed)
Progress Note   Patient: Adam Cabrera NGE:[id number] DOB: May 12, 1986 DOA: 03/23/2023     7 DOS: the patient was seen and examined on 03/30/2023   Brief hospital course:  Adam Cabrera is a 37 y.o. male with medical history significant for hypertension, scrotal cellulitis, history of IV drug use, presents with low back pain.  MRI of the lumbar spine showed Epidural extension with epidural phlegmon/abscess within the ventral epidural space extending from L1-2 through L3-4.  Blood culture positive for Staph aureus, patient treated with cefepime and vancomycin.  Patient has been seen by ID and neurosurgery, L spine washout performed 8/19.      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 MSSA septicemia. Acute back pain with sciatica Patient met sepsis criteria at time of admission with heart rate 144, respiratory rate 26, this is secondary to epidural abscess. Patient has been seen by neurosurgery, epidural washout performed 8/19. Consult from ID is obtained.  Antibiotic changed to cefazolin. Repeat blood culture sent out 8/21, echocardiogram showed ejection fraction 65 to 70%, no vegetation identified, no valvular abnormality. Discussed with ID, most likely will treat the hospital for 4 weeks with IV antibiotics, followed with oral antibiotics. Patient still have severe back pain with numbing tingling in the lower extremity.  Discussed with neurosurgery, this is expected from decompression of the spinal cord. Repeated blood culture came back negative.  Wound culture positive for Staph aureus. Wound VAC removed on  8/24 by neurosurgery.  Condition improving, continue cefazolin.    Fresh red blood per rectum. Thrombocytopenia. Patient developed a mild thrombocytopenia today, this is probably due to sepsis.  Will continue to follow. Patient also had an episode of fresh red blood per rectum 8/22, this is after large bowel movement.   Platelet count has normalized, patient does not have additional episodes of rectal bleeding. Still working on constipation.   Hypokalemia. Hyponatremia. Sodium and potassium are normalized.   Obesity (BMI 30-39.9) Diet and exercise.     Hypertension Continue to monitor          Subjective: Patient still feels constipated, but does not want take another dose of lactulose.  Back pain is better controlled.  Physical Exam: Vitals:   03/29/23 0810 03/29/23 1559 03/29/23 2333 03/30/23 0814  BP: (!) 135/92 (!) 143/86 (!) 133/93 (!) 122/91  Pulse: 97 (!) 102 (!) 102 91  Resp: 16 16 16 17   Temp: 98.2 F (36.8 C) 98.5 F (36.9 C) 98.1 F (36.7 C) 98.1 F (36.7 C)  TempSrc: Oral Oral    SpO2: 96% 99% 98% 98%  Weight:      Height:       General exam: Appears calm and comfortable  Respiratory system: Clear to auscultation. Respiratory effort normal. Cardiovascular system: S1 & S2 heard, RRR. No JVD, murmurs, rubs, gallops or clicks. No pedal edema. Gastrointestinal system: Abdomen is nondistended, soft and nontender. No organomegaly or masses felt. Normal bowel sounds heard. Central nervous system: Alert and oriented. No focal neurological deficits. Extremities: Symmetric 5 x 5 power. Skin: No rashes, lesions or ulcers Psychiatry: Judgement and insight  appear normal. Mood & affect appropriate.    Data Reviewed:  Lab results reviewed.  Family Communication: None  Disposition: Status is: Inpatient Remains inpatient appropriate because: Severity of disease, IV treatment     Time spent: 35 minutes  Author: Marrion Coy, MD 03/30/2023 12:21  PM  For on call review [email or web address].

## 2023-03-31 DIAGNOSIS — K6812 Psoas muscle abscess: Secondary | ICD-10-CM | POA: Diagnosis not present

## 2023-03-31 DIAGNOSIS — M5440 Lumbago with sciatica, unspecified side: Secondary | ICD-10-CM | POA: Diagnosis not present

## 2023-03-31 DIAGNOSIS — A4101 Sepsis due to Methicillin susceptible Staphylococcus aureus: Secondary | ICD-10-CM | POA: Diagnosis not present

## 2023-03-31 DIAGNOSIS — M4646 Discitis, unspecified, lumbar region: Secondary | ICD-10-CM | POA: Diagnosis not present

## 2023-03-31 DIAGNOSIS — G061 Intraspinal abscess and granuloma: Secondary | ICD-10-CM | POA: Diagnosis not present

## 2023-03-31 LAB — CULTURE, BLOOD (ROUTINE X 2)
Culture: NO GROWTH
Culture: NO GROWTH

## 2023-03-31 LAB — CBC WITH DIFFERENTIAL/PLATELET
Abs Immature Granulocytes: 0.1 10*3/uL — ABNORMAL HIGH (ref 0.00–0.07)
Basophils Absolute: 0 10*3/uL (ref 0.0–0.1)
Basophils Relative: 0 %
Eosinophils Absolute: 0.2 10*3/uL (ref 0.0–0.5)
Eosinophils Relative: 3 %
HCT: 36.3 % — ABNORMAL LOW (ref 39.0–52.0)
Hemoglobin: 11.8 g/dL — ABNORMAL LOW (ref 13.0–17.0)
Immature Granulocytes: 1 %
Lymphocytes Relative: 20 %
Lymphs Abs: 1.5 10*3/uL (ref 0.7–4.0)
MCH: 27.1 pg (ref 26.0–34.0)
MCHC: 32.5 g/dL (ref 30.0–36.0)
MCV: 83.4 fL (ref 80.0–100.0)
Monocytes Absolute: 0.6 10*3/uL (ref 0.1–1.0)
Monocytes Relative: 8 %
Neutro Abs: 4.8 10*3/uL (ref 1.7–7.7)
Neutrophils Relative %: 68 %
Platelets: 176 10*3/uL (ref 150–400)
RBC: 4.35 MIL/uL (ref 4.22–5.81)
RDW: 16.8 % — ABNORMAL HIGH (ref 11.5–15.5)
WBC: 7.2 10*3/uL (ref 4.0–10.5)
nRBC: 0 % (ref 0.0–0.2)

## 2023-03-31 MED ORDER — LACTULOSE 10 GM/15ML PO SOLN
20.0000 g | Freq: Once | ORAL | Status: AC
  Administered 2023-03-31: 20 g via ORAL
  Filled 2023-03-31: qty 30

## 2023-03-31 NOTE — Progress Notes (Signed)
Date of Admission:  03/23/2023      ID: Adam Cabrera is a 37 y.o. 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 Active Problems:   Acute back pain with sciatica   Hypertension   Obesity (BMI 30-39.9)   Mucous retention cyst of maxillary sinus   MSSA (methicillin susceptible Staphylococcus aureus) septicemia (HCC)   Psoas abscess, left (HCC)   Thrombocytopenia (HCC)   Fresh blood passed per rectum   MSSA bacteremia  Underwent laminectomy and debridement yesterday  Subjective: Doing okay Some stiffness back and pain b/l to the inguinal area Some tingling feet Walked with PT  Medications:   Chlorhexidine Gluconate Cloth  6 each Topical Daily   gabapentin  300 mg Oral TID   hydrocortisone  25 mg Rectal BID   nicotine  21 mg Transdermal Daily   senna-docusate  2 tablet Oral BID   sodium chloride flush  3 mL Intravenous Q12H   traZODone  50 mg Oral QHS    Objective: Vital signs in last 24 hours: Patient Vitals for the past 24 hrs:  BP Temp Pulse Resp SpO2  03/31/23 0842 (!) 130/92 98.6 F (37 C) 88 18 100 %  03/31/23 0014 124/84 98.2 F (36.8 C) 89 14 95 %  03/30/23 1605 139/87 98.8 F (37.1 C) 92 17 97 %      PHYSICAL EXAM:  General: Alert, no distress  Lungs: Clear to auscultation bilaterally. No Wheezing or Rhonchi. No rales. Heart: Regular rate and rhythm, no murmur, rub or gallop. Abdomen: Soft, non-tender,not distended. Bowel sounds normal. No masses Extremities:edema legs Venous staining both legs rt > left Skin: No rashes or lesions. Or bruising Lymph: Cervical, supraclavicular normal. Neurologic: cannot assess strength because of pain  Lab Results    Latest Ref Rng & Units 03/29/2023    5:15 AM 03/28/2023    5:23 AM 03/27/2023    2:40 AM  CBC  WBC 4.0 - 10.5 K/uL 5.2  6.0  5.9   Hemoglobin 13.0 - 17.0 g/dL 04.5  40.9  81.1   Hematocrit 39.0 - 52.0 % 34.2  33.1  33.8   Platelets 150 - 400 K/uL 156  158  144        Latest Ref Rng & Units  03/28/2023    5:23 AM 03/27/2023    2:40 AM 03/26/2023    4:54 AM  CMP  Glucose 70 - 99 mg/dL 914  92  782   BUN 6 - 20 mg/dL 7  8  10    Creatinine 0.61 - 1.24 mg/dL 9.56  2.13  0.86   Sodium 135 - 145 mmol/L 136  133  132   Potassium 3.5 - 5.1 mmol/L 4.0  3.7  2.9   Chloride 98 - 111 mmol/L 98  102  101   CO2 22 - 32 mmol/L 27  25  25    Calcium 8.9 - 10.3 mg/dL 9.0  8.4  8.1   Total Protein 6.5 - 8.1 g/dL  7.4    Total Bilirubin 0.3 - 1.2 mg/dL  1.1    Alkaline Phos 38 - 126 U/L  70    AST 15 - 41 U/L  15    ALT 0 - 44 U/L  18        Microbiology: 8/18 BC- MSSA 8/19 Surgical culture staph aureus 8/21 BC repeated Studies/Results: No results found.   Assessment/Plan: MSSA bacteremia Discitis and osteomyleitis L2-L3 With epidural abscess/phlegmon at the same level S/p laminectomy and  debridement Repeat blood culture sent On nafcillin continuous infusion -will need for 6 weeks 2 d echo no vegetation TEE not needed as it will not change management While on Nafcillin monitor renal and liver function and wbc  B/l psoas abscesses ,     Chronic Hepaittis C- has not ben treated- will need management as OP   Splenomegaly likely due to portal hypertension likely cirrhosis   IVDA- cannot be sent home with PICC line and IV antibiotic   Discussed the management with the patient and care team ?

## 2023-03-31 NOTE — Plan of Care (Signed)
  Problem: Education: Goal: Knowledge of General Education information will improve Description: Including pain rating scale, medication(s)/side effects and non-pharmacologic comfort measures Outcome: Progressing   Problem: Clinical Measurements: Goal: Will remain free from infection Outcome: Progressing Goal: Diagnostic test results will improve Outcome: Progressing   Problem: Nutrition: Goal: Adequate nutrition will be maintained Outcome: Progressing   Problem: Coping: Goal: Level of anxiety will decrease Outcome: Progressing   Problem: Pain Managment: Goal: General experience of comfort will improve Outcome: Progressing   Problem: Safety: Goal: Ability to remain free from injury will improve Outcome: Progressing   Problem: Health Behavior/Discharge Planning: Goal: Ability to manage health-related needs will improve Outcome: Progressing   Problem: Activity: Goal: Risk for activity intolerance will decrease Outcome: Progressing

## 2023-03-31 NOTE — Progress Notes (Signed)
Progress Note   Patient: Adam Cabrera ZOX:[id number] DOB: 11-Jun-1986 DOA: 03/23/2023     8 DOS: the patient was seen and examined on 03/31/2023   Brief hospital course:  Adam Cabrera is a 37 y.o. male with medical history significant for hypertension, scrotal cellulitis, history of IV drug use, presents with low back pain.  MRI of the lumbar spine showed Epidural extension with epidural phlegmon/abscess within the ventral epidural space extending from L1-2 through L3-4.  Blood culture positive for Staph aureus, patient treated with cefepime and vancomycin.  Patient has been seen by ID and neurosurgery, L spine washout performed 8/19. Patient condition had improved up to 8/26, wound VAC removed.  Patient will need at least 4 weeks of IV antibiotics.  Per TOC, there is no placement option.     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 MSSA septicemia. Acute back pain with sciatica Patient met sepsis criteria at time of admission with heart rate 144, respiratory rate 26, this is secondary to epidural abscess. Patient has been seen by neurosurgery, epidural washout performed 8/19. Consult from ID is obtained.  Antibiotic changed to cefazolin. Repeat blood culture sent out 8/21, echocardiogram showed ejection fraction 65 to 70%, no vegetation identified, no valvular abnormality. Discussed with ID, most likely will treat the hospital for 4 weeks with IV antibiotics, followed with oral antibiotics. Patient still have severe back pain with numbing tingling in the lower extremity.  Discussed with neurosurgery, this is  expected from decompression of the spinal cord. Repeated blood culture came back negative.  Wound culture positive for Staph aureus. Wound VAC removed on 8/24 by neurosurgery.   Continue cefazolin, discussed with TOC, patient has no placement option, will need to finish out 4 weeks antibiotic in the hospital.     Fresh red blood per rectum. Thrombocytopenia. Patient developed a mild thrombocytopenia today, this is probably due to sepsis.  Will continue to follow. Patient also had an episode of fresh red blood per rectum 8/22, this is after large bowel movement.   Platelet count has normalized, patient does not have additional episodes of rectal bleeding. Give another dose of lactulose for constipation, continue topical steroids for internal hemorrhoids.   Hypokalemia. Hyponatremia. Sodium and potassium are normalized.   Obesity (BMI 30-39.9) Diet and exercise.     Hypertension Continue to monitor            Subjective:  Patient still has some back pain, but her condition gradually improving.  Physical Exam: Vitals:   03/30/23 0814 03/30/23 1605 03/31/23 0014 03/31/23 0842  BP: (!) 122/91 139/87 124/84 (!) 130/92  Pulse: 91 92 89 88  Resp: 17 17 14 18   Temp: 98.1 F (36.7 C) 98.8 F (37.1 C) 98.2 F (36.8 C) 98.6 F (37 C)  TempSrc:      SpO2: 98% 97% 95% 100%  Weight:      Height:       General exam: Appears calm and comfortable  Respiratory system: Clear to auscultation. Respiratory effort normal. Cardiovascular system: S1 & S2 heard, RRR. No JVD, murmurs, rubs, gallops or  clicks. No pedal edema. Gastrointestinal system: Abdomen is nondistended, soft and nontender. No organomegaly or masses felt. Normal bowel sounds heard. Central nervous system: Alert and oriented. No focal neurological deficits. Extremities: Symmetric 5 x 5 power. Skin: No rashes, lesions or ulcers Psychiatry: Judgement and insight appear normal. Mood & affect appropriate.    Data  Reviewed:  No new lab results.  Family Communication: None  Disposition: Status is: Inpatient Remains inpatient appropriate because: Severity of disease, IV antibiotics.     Time spent: 35 minutes  Author: Marrion Coy, MD 03/31/2023 1:48 PM  For on call review [email or web address].

## 2023-03-31 NOTE — TOC Progression Note (Signed)
Transition of Care [facility]) - Progression Note    Patient Details  Name: Adam Cabrera MRN: [id number] Date of Birth: 02-Feb-1986  Transition of Care [facility]) CM/SW Contact  Marlowe Sax, RN Phone Number: 03/31/2023, 1:48 PM  Clinical Narrative:    TOC continues to follow the patient and assist with needs, He will be in the hospital for the 4 weeks of IV ABX due to HX of IV drug Use, lack of ins and lack of income to pay for 4 weeks of STR SNF   Expected Discharge Plan: Home/Self Care Barriers to Discharge: Continued Medical Work up  Expected Discharge Plan and Services                                               Social Determinants of Health (SDOH) Interventions SDOH Screenings   Tobacco Use: High Risk (03/24/2023)    Readmission Risk Interventions     No data to display

## 2023-03-31 NOTE — Progress Notes (Signed)
Physical Therapy Treatment Patient Details Name: Adam Cabrera MRN: [id number] DOB: 02-09-1986 Today's Date: 03/31/2023   History of Present Illness 37 y.o. male with medical history significant for hypertension, scrotal cellulitis, history of IV drug use, presents with low back pain.  MRI of the lumbar spine showed Epidural extension with epidural phlegmon/abscess within the ventral epidural space extending from L1-2 through L3-4 and s/p lumbar laminectomy    PT Comments  Pt is demonstrating good progress towards PT goals. Pt is received in bed, he is agreeable to PT session. Pt reports back pain of 7/10 NPS at beginning of session. Pt performs improved bed mobility CGA, transfers and amb min A using RW. Pt able to amb approx 30 ft min A using RW with no reports of increased pain, although reports cont BLE weakness stating "his legs feel unsteady at times". Pt able to perform 2x STS with min A, demonstrating improvements with technique. Cuing throughout session for hand placement during use of RW and maintaining RW closer to trunk to reduce risk of falls. Pt in [facility] at end of session with RN at bedside. Pt would benefit from cont skilled PT to address above deficits and promote optimal return to PLOF.    If plan is discharge home, recommend the following: A lot of help with bathing/dressing/bathroom;Assist for transportation;Help with stairs or ramp for entrance;A lot of help with walking and/or transfers;Assistance with cooking/housework   Can travel by private vehicle        Equipment Recommendations  Other (comment) (TBD at this time)    Recommendations for Other Services       Precautions / Restrictions Precautions Precautions: Fall;Back Required Braces or Orthoses: Spinal Brace Spinal Brace: Thoracolumbosacral orthotic Spinal Brace Comments: for comfort only to be donned EOB. (pt has not wanted to use thus far) Restrictions Weight Bearing Restrictions: No     Mobility  Bed  Mobility Overal bed mobility: Needs Assistance Bed Mobility: Rolling, Sidelying to Sit Rolling: Contact guard assist, Used rails Sidelying to sit: Contact guard assist, HOB elevated, Used rails       General bed mobility comments: Increased time required to roll on R side but able to manage bed function to facilitate independent with bed mobility Patient Response: Anxious, Cooperative  Transfers Overall transfer level: Needs assistance Equipment used: Rolling walker (2 wheels) Transfers: Sit to/from Stand Sit to Stand: From elevated surface, Min assist   Step pivot transfers: Min assist       General transfer comment: x2 STS and step pivot requiring min A for completion of task; overall improving    Ambulation/Gait Ambulation/Gait assistance: Min assist Gait Distance (Feet): 30 Feet Assistive device: Rolling walker (2 wheels) Gait Pattern/deviations: Decreased step length - right, Decreased step length - left, Decreased stride length, Step-through pattern Gait velocity: decreased     General Gait Details: Cuing to maintain RW close to trunk during amb   Stairs             Wheelchair Mobility     Tilt Bed Tilt Bed Patient Response: Anxious, Cooperative  Modified Rankin (Stroke Patients Only)       Balance Overall balance assessment: Needs assistance Sitting-balance support: Feet supported, Bilateral upper extremity supported Sitting balance-Leahy Scale: Good Sitting balance - Comments: Able to maintain seated EOB balance with heavy reliance on BUE to reduce back discomfort   Standing balance support: Bilateral upper extremity supported, During functional activity, Reliant on assistive device for balance Standing balance-Leahy Scale: Good Standing balance  comment: Able to maintain static standing balance during functional activities with heavy reliance on BUE to reduce back discomfort; Cuing to relax B shoulders                             Cognition Arousal: Alert Behavior During Therapy: WFL for tasks assessed/performed Overall Cognitive Status: Within Functional Limits for tasks assessed                                 General Comments: pt was alert and agreeable to session but remains limited by pain, although improving        Exercises      General Comments        Pertinent Vitals/Pain Pain Assessment Pain Assessment: 0-10 Pain Score: 7  Pain Location: lower back/sacral pain Pain Descriptors / Indicators: Discomfort, Grimacing, Guarding, Operative site guarding, Sore Pain Intervention(s): Monitored during session, Premedicated before session    Home Living                          Prior Function            PT Goals (current goals can now be found in the care plan section) Acute Rehab PT Goals Patient Stated Goal: less pain PT Goal Formulation: With patient Time For Goal Achievement: 04/08/23 Potential to Achieve Goals: Good Progress towards PT goals: Progressing toward goals    Frequency    Min 1X/week      PT Plan      Co-evaluation              AM-PAC PT "6 Clicks" Mobility   Outcome Measure  Help needed turning from your back to your side while in a flat bed without using bedrails?: A Little Help needed moving from lying on your back to sitting on the side of a flat bed without using bedrails?: A Little Help needed moving to and from a bed to a chair (including a wheelchair)?: A Little Help needed standing up from a chair using your arms (e.g., wheelchair or bedside chair)?: A Little Help needed to walk in hospital room?: A Little Help needed climbing 3-5 steps with a railing? : Total 6 Click Score: 16    End of Session Equipment Utilized During Treatment: Gait belt Activity Tolerance: Patient tolerated treatment well;Patient limited by pain Patient left: with call bell/phone within reach;with nursing/sitter in room;Other (comment) (on BSC at end of  session with RN bedside) Nurse Communication: Mobility status PT Visit Diagnosis: Unsteadiness on feet (R26.81);Pain;Muscle weakness (generalized) (M62.81);Difficulty in walking, not elsewhere classified (R26.2) Pain - Right/Left:  (bilateral) Pain - part of body:  (back)     Time: 0272-5366 PT Time Calculation (min) (ACUTE ONLY): 22 min  Charges:                            Elmon Else, SPT    Gwendolynn Merkey 03/31/2023, 10:59 AM

## 2023-04-01 DIAGNOSIS — M5440 Lumbago with sciatica, unspecified side: Secondary | ICD-10-CM | POA: Diagnosis not present

## 2023-04-01 DIAGNOSIS — B9561 Methicillin susceptible Staphylococcus aureus infection as the cause of diseases classified elsewhere: Secondary | ICD-10-CM | POA: Diagnosis not present

## 2023-04-01 DIAGNOSIS — G061 Intraspinal abscess and granuloma: Secondary | ICD-10-CM | POA: Diagnosis not present

## 2023-04-01 DIAGNOSIS — R7881 Bacteremia: Secondary | ICD-10-CM | POA: Diagnosis not present

## 2023-04-01 LAB — COMPREHENSIVE METABOLIC PANEL
ALT: 14 U/L (ref 0–44)
AST: 20 U/L (ref 15–41)
Albumin: 3 g/dL — ABNORMAL LOW (ref 3.5–5.0)
Alkaline Phosphatase: 73 U/L (ref 38–126)
Anion gap: 8 (ref 5–15)
BUN: 8 mg/dL (ref 6–20)
CO2: 25 mmol/L (ref 22–32)
Calcium: 8.9 mg/dL (ref 8.9–10.3)
Chloride: 101 mmol/L (ref 98–111)
Creatinine, Ser: 0.54 mg/dL — ABNORMAL LOW (ref 0.61–1.24)
GFR, Estimated: >60 mL/min (ref >60)
Glucose, Bld: 100 mg/dL — ABNORMAL HIGH (ref 70–99)
Potassium: 3.7 mmol/L (ref 3.5–5.1)
Sodium: 134 mmol/L — ABNORMAL LOW (ref 135–145)
Total Bilirubin: 1.1 mg/dL (ref 0.3–1.2)
Total Protein: 7.6 g/dL (ref 6.5–8.1)

## 2023-04-01 LAB — C-REACTIVE PROTEIN: CRP: 0.6 mg/dL (ref <1.0)

## 2023-04-01 LAB — SEDIMENTATION RATE: Sed Rate: 58 mm/h — ABNORMAL HIGH (ref 0–15)

## 2023-04-01 NOTE — Plan of Care (Signed)

## 2023-04-01 NOTE — Plan of Care (Signed)

## 2023-04-01 NOTE — Progress Notes (Signed)
Progress Note   Patient: Adam Cabrera WGN:[id number] DOB: 01/28/1986 DOA: 03/23/2023     9 DOS: the patient was seen and examined on 04/01/2023   Brief hospital course:  DONLEY BIERMANN is a 37 y.o. male with medical history significant for hypertension, scrotal cellulitis, history of IV drug use, presents with low back pain.  MRI of the lumbar spine showed Epidural extension with epidural phlegmon/abscess within the ventral epidural space extending from L1-2 through L3-4.  Blood culture positive for Staph aureus, patient treated with cefepime and vancomycin.  Patient has been seen by ID and neurosurgery, L spine washout performed 8/19. Patient condition had improved up to 8/26, wound VAC removed.  Patient will need at least 4 weeks of IV antibiotics.  Per TOC, there is no placement option.     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 MSSA septicemia. Acute back pain with sciatica Patient met sepsis criteria at time of admission with heart rate 144, respiratory rate 26, this is secondary to epidural abscess. Patient has been seen by neurosurgery, epidural washout performed 8/19. Consult from ID is obtained.  Antibiotic changed to cefazolin. Repeat blood culture sent out 8/21, echocardiogram showed ejection fraction 65 to 70%, no vegetation identified, no valvular abnormality. Discussed with ID, most likely will treat the hospital for 4 weeks with IV antibiotics, followed with oral antibiotics. Patient still have severe back pain with numbing tingling in the lower extremity. Per neurosurgery, this is expected  from decompression of the spinal cord. Repeated blood culture came back negative.  Wound culture positive for Staph aureus. Wound VAC removed on 8/24 by neurosurgery.   Continue cefazolin, discussed with TOC, patient has no placement option, will need to finish out 4 weeks antibiotic in the hospital. Patient back pain seems to be improving, able to walk more.  Fresh red blood per rectum. Thrombocytopenia. Thrombocytopenia has resolved. Patient also had an episode of fresh red blood per rectum 8/22, this is after large bowel movement.  This appears to be due to internal hemorrhoids.  Continue topical steroids, no additional rectal bleeding.  continue stool softener.   Hypokalemia. Hyponatremia. Potassium normalized.  Only mild hyponatremia.   Obesity (BMI 30-39.9) Diet and exercise.     Hypertension Continue to monitor       Subjective:  Seem to be better with the back pain, able to walk in the hallway.  Numbness and tingling of the foot is also getting better. He also had a large bowel movement last night.  Physical Exam: Vitals:   03/31/23 1434 03/31/23 2324 04/01/23 0500 04/01/23 0742  BP: (!) 132/91 119/86  (!) 122/90  Pulse: 98 97  90  Resp: 17 20  16   Temp: 98.9 F (37.2 C) (!) 97.5 F (36.4 C)  97.7 F (36.5 C)  TempSrc:      SpO2: 100% 96%  99%  Weight:   131 kg   Height:       General exam: Appears calm and comfortable  Respiratory system: Clear to auscultation. Respiratory effort normal. Cardiovascular system: S1 & S2 heard, RRR. No JVD,  murmurs, rubs, gallops or clicks. No pedal edema. Gastrointestinal system: Abdomen is nondistended, soft and nontender. No organomegaly or masses felt. Normal bowel sounds heard. Central nervous system: Alert and oriented. No focal neurological deficits. Extremities: Symmetric 5 x 5 power. Skin: No rashes, lesions or ulcers Psychiatry: Judgement and insight appear normal. Mood & affect appropriate.    Data  Reviewed:  Lab results reviewed.  Family Communication: None  Disposition: Status is: Inpatient Remains inpatient appropriate because: Unsafe discharge, patient need to finish IV antibiotic in the hospital.     Time spent: 35 minutes  Author: Marrion Coy, MD 04/01/2023 12:24 PM  For on call review [email or web address].

## 2023-04-02 DIAGNOSIS — M8600 Acute hematogenous osteomyelitis, unspecified site: Secondary | ICD-10-CM

## 2023-04-02 DIAGNOSIS — A4101 Sepsis due to Methicillin susceptible Staphylococcus aureus: Secondary | ICD-10-CM | POA: Diagnosis not present

## 2023-04-02 DIAGNOSIS — G061 Intraspinal abscess and granuloma: Secondary | ICD-10-CM | POA: Diagnosis not present

## 2023-04-02 DIAGNOSIS — M869 Osteomyelitis, unspecified: Secondary | ICD-10-CM

## 2023-04-02 DIAGNOSIS — K6812 Psoas muscle abscess: Secondary | ICD-10-CM | POA: Diagnosis not present

## 2023-04-02 DIAGNOSIS — D696 Thrombocytopenia, unspecified: Secondary | ICD-10-CM

## 2023-04-02 MED ORDER — LORAZEPAM 0.5 MG PO TABS
0.5000 mg | ORAL_TABLET | Freq: Four times a day (QID) | ORAL | Status: DC | PRN
  Administered 2023-04-08 – 2023-04-25 (×9): 0.5 mg via ORAL
  Filled 2023-04-02 (×10): qty 1

## 2023-04-02 MED ORDER — HYDROCORTISONE ACETATE 25 MG RE SUPP
25.0000 mg | Freq: Three times a day (TID) | RECTAL | Status: DC
  Administered 2023-04-02 – 2023-04-05 (×8): 25 mg via RECTAL
  Filled 2023-04-02 (×25): qty 1

## 2023-04-02 NOTE — Assessment & Plan Note (Addendum)
And discitis and osteomyelitis of L2-L3.  Continue IV nafcillin and rifampin.

## 2023-04-02 NOTE — Assessment & Plan Note (Addendum)
Last platelet count 129.  Wondering if this is secondary to nafcillin.  Recheck tomorrow.  Hepatitis C viral load positive, hepatitis A negative, hepatitis B surface antigen negative

## 2023-04-02 NOTE — Assessment & Plan Note (Deleted)
Continue continuous IV nafcillin.

## 2023-04-02 NOTE — Assessment & Plan Note (Deleted)
Bleeding likely secondary to hemorrhoids.  Seen by GI.  Anusol suppositories.  Will start Lovenox to prevent DVT and monitor.

## 2023-04-02 NOTE — Progress Notes (Signed)
Physical Therapy Treatment Patient Details Name: Adam Cabrera MRN: [id number] DOB: [birth year] Today's Date: 04/02/2023   History of Present Illness 37 y.o. male with medical history significant for hypertension, scrotal cellulitis, history of IV drug use, presents with low back pain.  MRI of the lumbar spine showed Epidural extension with epidural phlegmon/abscess within the ventral epidural space extending from L1-2 through L3-4 and s/p lumbar laminectomy    PT Comments  Pt steadily progressing towards PT goals. Pt is received in bed, he is agreeable to PT session. Pt reports low back soreness more than pain due to "maybe sleeping awkwardly last night". Pt able to perform bed mobility CGA with use of bed controls to cont improving independence, transfers and amb min A. Pt demonstrated slight difficulty with STS from elevated bed in today's session requiring min A for initiation but able to complete task on his own. Pt cont to report standing feels better than sitting. Pt able to amb approx 60 ft using RW min A and a chair follow for safety. Cuing throughout amb to promote upright posture and trunk close to RW. Overall, Pt demonstrated improvement with sequencing using RW during amb. Pt cont to demonstrate high motivation to get better and cooperative during session. Encouraged Pt to cont being motivated and participating with rehab to improve overall mobility-Pt verbalized understanding. Pt would benefit from cont skilled PT to address above deficits and promote optimal return to PLOF.    If plan is discharge home, recommend the following: A lot of help with bathing/dressing/bathroom;Assist for transportation;Help with stairs or ramp for entrance;A lot of help with walking and/or transfers;Assistance with cooking/housework   Can travel by private vehicle        Equipment Recommendations  Other (comment) (TBD at this time)    Recommendations for Other Services       Precautions / Restrictions  Precautions Precautions: Fall;Back Required Braces or Orthoses: Spinal Brace Spinal Brace: Thoracolumbosacral orthotic Spinal Brace Comments: for comfort only to be donned EOB. (pt has not wanted to use thus far) Restrictions Weight Bearing Restrictions: No     Mobility  Bed Mobility Overal bed mobility: Needs Assistance Bed Mobility: Rolling, Sidelying to Sit Rolling: Contact guard assist, Used rails Sidelying to sit: Contact guard assist, HOB elevated, Used rails       General bed mobility comments: Increased time required to roll on R side but able to manage bed function to facilitate independence with bed mobility; demonstrating decreased time from prior visit    Transfers Overall transfer level: Needs assistance Equipment used: Rolling walker (2 wheels) Transfers: Sit to/from Stand Sit to Stand: From elevated surface, Min assist           General transfer comment: assistance with initiation of task but able to complete task    Ambulation/Gait Ambulation/Gait assistance: Min assist Gait Distance (Feet): 60 Feet Assistive device: Rolling walker (2 wheels) Gait Pattern/deviations: Decreased step length - right, Decreased step length - left, Decreased stride length, Step-through pattern Gait velocity: decreased     General Gait Details: Cuing to maintain RW close to trunk during amb   Stairs             Wheelchair Mobility     Tilt Bed    Modified Rankin (Stroke Patients Only)       Balance Overall balance assessment: Needs assistance Sitting-balance support: Feet supported, Bilateral upper extremity supported Sitting balance-Leahy Scale: Good Sitting balance - Comments: Able to maintain seated EOB balance with heavy  reliance on BUE to reduce back discomfort   Standing balance support: Bilateral upper extremity supported, During functional activity, Reliant on assistive device for balance Standing balance-Leahy Scale: Good Standing balance  comment: Able to maintain static standing balance during functional activities with heavy reliance on BUE to reduce back discomfort; Cuing to relax B shoulders                            Cognition Arousal: Alert Behavior During Therapy: WFL for tasks assessed/performed Overall Cognitive Status: Within Functional Limits for tasks assessed                                 General Comments: pt was alert and agreeable to session but remains limited by discomfort/soreness, although improving        Exercises      General Comments        Pertinent Vitals/Pain Pain Assessment Pain Assessment: 0-10 Pain Score: 6  Pain Location: lower back/sacral pain Pain Descriptors / Indicators: Discomfort, Sore, Guarding, Operative site guarding, Grimacing Pain Intervention(s): Monitored during session, Premedicated before session, Repositioned    Home Living                          Prior Function            PT Goals (current goals can now be found in the care plan section) Acute Rehab PT Goals Patient Stated Goal: less pain PT Goal Formulation: With patient Time For Goal Achievement: 04/08/23 Potential to Achieve Goals: Good Progress towards PT goals: Progressing toward goals    Frequency    Min 1X/week      PT Plan      Co-evaluation              AM-PAC PT "6 Clicks" Mobility   Outcome Measure  Help needed turning from your back to your side while in a flat bed without using bedrails?: A Little Help needed moving from lying on your back to sitting on the side of a flat bed without using bedrails?: A Little Help needed moving to and from a bed to a chair (including a wheelchair)?: A Little Help needed standing up from a chair using your arms (e.g., wheelchair or bedside chair)?: A Little Help needed to walk in hospital room?: A Little Help needed climbing 3-5 steps with a railing? : Total 6 Click Score: 16    End of Session  Equipment Utilized During Treatment: Gait belt Activity Tolerance: Patient tolerated treatment well;Patient limited by pain Patient left: with call bell/phone within reach;with nursing/sitter in room;Other (comment) Nurse Communication: Mobility status PT Visit Diagnosis: Unsteadiness on feet (R26.81);Pain;Muscle weakness (generalized) (M62.81);Difficulty in walking, not elsewhere classified (R26.2) Pain - Right/Left:  (bilateral) Pain - part of body:  (back)     Time: 1914-7829 PT Time Calculation (min) (ACUTE ONLY): 24 min  Charges:    $Gait Training: 23-37 mins PT General Charges $$ ACUTE PT VISIT: 1 Visit                     Elmon Else, SPT    Yatziry Deakins 04/02/2023, 1:58 PM

## 2023-04-02 NOTE — Progress Notes (Signed)
Occupational Therapy Treatment Patient Details Name: Adam Cabrera MRN: [id number] DOB: March 23, 1986 Today's Date: 04/02/2023   History of present illness 37 y.o. male with medical history significant for hypertension, scrotal cellulitis, history of IV drug use, presents with low back pain.  MRI of the lumbar spine showed Epidural extension with epidural phlegmon/abscess within the ventral epidural space extending from L1-2 through L3-4 and s/p lumbar laminectomy   OT comments  Upon entering the room, pt supine in bed and requesting to go to bathroom for BM. Supine >sit with min A to EOB and min A to stand from elevated surface. Pt ambulates 25' to bathroom with RW and min guard- min A. Pt seated on low commode per his preference and able to have BM. He performs his hygiene while seated. Pt does continues to have bleeding with BM and RN notified. Pt needing +2 assistance to stand from low commode. Pt with 1 R knee buckle while ambulation back to bed requiring min - mod A. Pt returns to bed and sit >supine with mod A for B LEs. Call bell and all needed items within reach upon exiting the room.       If plan is discharge home, recommend the following:  Two people to help with walking and/or transfers;Two people to help with bathing/dressing/bathroom;Assistance with cooking/housework;Assist for transportation;Help with stairs or ramp for entrance   Equipment Recommendations  Other (comment) (RW)       Precautions / Restrictions Precautions Precautions: Fall;Back Required Braces or Orthoses: Spinal Brace Spinal Brace: Thoracolumbosacral orthotic Spinal Brace Comments: for comfort only to be donned EOB. (pt has not wanted to use thus far) Restrictions Weight Bearing Restrictions: No       Mobility Bed Mobility Overal bed mobility: Needs Assistance Bed Mobility: Supine to Sit, Sit to Supine     Supine to sit: Min assist Sit to supine: Mod assist        Transfers Overall transfer  level: Needs assistance Equipment used: Rolling walker (2 wheels) Transfers: Sit to/from Stand Sit to Stand: From elevated surface, Min assist           General transfer comment: +2 assistance from low toilet     Balance Overall balance assessment: Needs assistance Sitting-balance support: Feet supported, Bilateral upper extremity supported Sitting balance-Leahy Scale: Good     Standing balance support: Bilateral upper extremity supported, During functional activity, Reliant on assistive device for balance Standing balance-Leahy Scale: Fair                             ADL either performed or assessed with clinical judgement   ADL Overall ADL's : Needs assistance/impaired                         Toilet Transfer: Moderate assistance;+2 for physical assistance   Toileting- Clothing Manipulation and Hygiene: Moderate assistance;Sit to/from stand              Extremity/Trunk Assessment Upper Extremity Assessment Upper Extremity Assessment: Generalized weakness   Lower Extremity Assessment Lower Extremity Assessment: Generalized weakness        Vision Patient Visual Report: No change from baseline            Cognition Arousal: Alert Behavior During Therapy: WFL for tasks assessed/performed Overall Cognitive Status: Within Functional Limits for tasks assessed  Pertinent Vitals/ Pain       Pain Assessment Pain Assessment: 0-10 Pain Score: 6  Pain Location: lower back/sacral pain Pain Descriptors / Indicators: Discomfort, Sore, Guarding, Operative site guarding, Grimacing Pain Intervention(s): Monitored during session, Repositioned         Frequency  Min 1X/week        Progress Toward Goals  OT Goals(current goals can now be found in the care plan section)  Progress towards OT goals: Progressing toward goals            AM-PAC OT "6 Clicks" Daily  Activity     Outcome Measure   Help from another person eating meals?: None Help from another person taking care of personal grooming?: A Little Help from another person toileting, which includes using toliet, bedpan, or urinal?: A Lot Help from another person bathing (including washing, rinsing, drying)?: A Little Help from another person to put on and taking off regular upper body clothing?: A Little Help from another person to put on and taking off regular lower body clothing?: A Lot 6 Click Score: 17    End of Session Equipment Utilized During Treatment: Rolling walker (2 wheels)  OT Visit Diagnosis: Unsteadiness on feet (R26.81);Repeated falls (R29.6);Muscle weakness (generalized) (M62.81)   Activity Tolerance Patient tolerated treatment well   Patient Left in bed;with call bell/phone within reach;with bed alarm set   Nurse Communication Mobility status        Time: 9528-4132 OT Time Calculation (min): 43 min  Charges: OT General Charges $OT Visit: 1 Visit OT Treatments $Self Care/Home Management : 38-52 mins  Jackquline Denmark, MS, OTR/L , CBIS ascom [phone]  04/02/23, 3:46 PM

## 2023-04-02 NOTE — Assessment & Plan Note (Addendum)
Continue IV nafcillin and rifampin.

## 2023-04-02 NOTE — Plan of Care (Signed)

## 2023-04-02 NOTE — Assessment & Plan Note (Addendum)
Present on admission with tachycardia, tachypnea and epidural abscess.  Continue continuous IV nafcillin and rifampin.

## 2023-04-02 NOTE — Progress Notes (Signed)
  Progress Note   Patient: Adam Cabrera WGN:[id number] DOB: June 07, 1986 DOA: 03/23/2023     10 DOS: the patient was seen and examined on 04/02/2023   Brief hospital course:  Adam Cabrera is a 37 y.o. male with medical history significant for hypertension, scrotal cellulitis, history of IV drug use, presents with low back pain.  MRI of the lumbar spine showed Epidural extension with epidural phlegmon/abscess within the ventral epidural space extending from L1-2 through L3-4.  Blood culture positive for Staph aureus, patient treated with cefepime and vancomycin.  Patient has been seen by ID and neurosurgery, L spine washout performed 8/19. Patient condition had improved up to 8/26, wound VAC removed.  Patient will need 6 weeks of IV antibiotics.  Per TOC, there is no placement option.  Patient on continuous nafcillin infusion.    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  Patient on continuous nafcillin.  ID recommends 6 weeks total of IV antibiotics.    MSSA (methicillin susceptible Staphylococcus aureus) septicemia (HCC) Present on admission with tachycardia, tachypnea and epidural abscess.  Continue continuous IV nafcillin.  Osteomyelitis (HCC) Discitis and osteomyelitis of L2-L3.  Continue IV nafcillin.  Psoas abscess, left (HCC) Continue IV nafcillin.  Thrombocytopenia (HCC) Improved  Fresh blood passed per rectum Bleeding likely secondary to hemorrhoids.  Seen by GI.  Anusol suppositories.  Obesity (BMI 30-39.9) BMI 37.08         Subjective: Patient admitted with epidural abscess and osteomyelitis.  Patient will be here for 6 weeks of IV antibiotics.  Complains of some back pain.  Physical Exam: Vitals:   04/01/23 1606 04/01/23 2141 04/02/23 0428 04/02/23 0730  BP: 138/88 129/83 138/80 (!) 131/108  Pulse: 95 94 85 90  Resp: 18 15 17 16   Temp: 97.8 F (36.6 C) 98.2 F  (36.8 C) 98.1 F (36.7 C) 98 F (36.7 C)  TempSrc:      SpO2: 96% 98% 97% 100%  Weight:      Height:       Physical Exam HENT:     Head: Normocephalic.     Mouth/Throat:     Pharynx: No oropharyngeal exudate.  Eyes:     General: Lids are normal.     Conjunctiva/sclera: Conjunctivae normal.  Cardiovascular:     Rate and Rhythm: Normal rate and regular rhythm.     Heart sounds: Normal heart sounds, S1 normal and S2 normal.  Pulmonary:     Breath sounds: No decreased breath sounds, wheezing, rhonchi or rales.  Abdominal:     Palpations: Abdomen is soft.     Tenderness: There is no abdominal tenderness.  Musculoskeletal:     Right lower leg: Swelling present.     Left lower leg: Swelling present.  Skin:    General: Skin is warm.     Findings: No rash.  Neurological:     Mental Status: He is alert and oriented to person, place, and time.     Data Reviewed: Sodium 134, creatinine 0.54, last hemoglobin 11.8   Disposition: Status is: Inpatient Remains inpatient appropriate because: Patient will be here for total of 6 weeks of IV antibiotics  Planned Discharge Destination: Home    Time spent: 28 minutes  Author: Alford Highland, MD 04/02/2023 2:56 PM  For on call review [email or web address].

## 2023-04-03 DIAGNOSIS — D696 Thrombocytopenia, unspecified: Secondary | ICD-10-CM | POA: Diagnosis not present

## 2023-04-03 DIAGNOSIS — A4101 Sepsis due to Methicillin susceptible Staphylococcus aureus: Secondary | ICD-10-CM | POA: Diagnosis not present

## 2023-04-03 DIAGNOSIS — M4626 Osteomyelitis of vertebra, lumbar region: Secondary | ICD-10-CM | POA: Diagnosis not present

## 2023-04-03 DIAGNOSIS — G061 Intraspinal abscess and granuloma: Secondary | ICD-10-CM | POA: Diagnosis not present

## 2023-04-03 DIAGNOSIS — B182 Chronic viral hepatitis C: Secondary | ICD-10-CM

## 2023-04-03 DIAGNOSIS — K6812 Psoas muscle abscess: Secondary | ICD-10-CM | POA: Diagnosis not present

## 2023-04-03 DIAGNOSIS — E871 Hypo-osmolality and hyponatremia: Secondary | ICD-10-CM

## 2023-04-03 DIAGNOSIS — B9562 Methicillin resistant Staphylococcus aureus infection as the cause of diseases classified elsewhere: Secondary | ICD-10-CM

## 2023-04-03 DIAGNOSIS — F1990 Other psychoactive substance use, unspecified, uncomplicated: Secondary | ICD-10-CM

## 2023-04-03 DIAGNOSIS — M8600 Acute hematogenous osteomyelitis, unspecified site: Secondary | ICD-10-CM | POA: Diagnosis not present

## 2023-04-03 DIAGNOSIS — M4646 Discitis, unspecified, lumbar region: Secondary | ICD-10-CM | POA: Diagnosis not present

## 2023-04-03 DIAGNOSIS — R161 Splenomegaly, not elsewhere classified: Secondary | ICD-10-CM

## 2023-04-03 DIAGNOSIS — M5441 Lumbago with sciatica, right side: Secondary | ICD-10-CM

## 2023-04-03 DIAGNOSIS — M549 Dorsalgia, unspecified: Secondary | ICD-10-CM | POA: Insufficient documentation

## 2023-04-03 NOTE — Progress Notes (Signed)
Physical Therapy Treatment Patient Details Name: Adam Cabrera MRN: [id number] DOB: [birth year] Today's Date: 04/03/2023   History of Present Illness 37 y.o. male with medical history significant for hypertension, scrotal cellulitis, history of IV drug use, presents with low back pain.  MRI of the lumbar spine showed Epidural extension with epidural phlegmon/abscess within the ventral epidural space extending from L1-2 through L3-4 and s/p lumbar laminectomy    PT Comments  Pt is steadily progressing towards PT goals but continues to be limited by LBP. Pt is received in recliner, he is notably in a lot of discomfort/pain rating low back pain as 8/10 NPS but is agreeable to PT session. Pt performs bed mobility min A, transfers mod A x2, and amb min A x2. Pt demonstrates increase guarding and grimacing when seated in recliner requiring mod A x2 to perform STS. Frequent cuing to maintain trunk closer to RW to optimize use of BUE for support. Pt able to amb using RW approx 60 ft min A x2 with chair following during last ~20 ft due to noted R knee buckling. Pt demonstrated increase effort during amb in today's effort as seen by straining and cues to breathe. Limited amb distance in today's visit due to increase pain, although pain subsided once in bed at the end of session. Educated Pt on safety concerns and finding a balance between challenging self and safety-Pt verbalized understanding. Pt would benefit from cont skilled PT to address above deficits and promote optimal return to PLOF.    If plan is discharge home, recommend the following: A lot of help with bathing/dressing/bathroom;Assist for transportation;Help with stairs or ramp for entrance;A lot of help with walking and/or transfers;Assistance with cooking/housework   Can travel by private vehicle        Equipment Recommendations  Other (comment) (TBD at this time)    Recommendations for Other Services       Precautions / Restrictions  Precautions Precautions: Fall;Back Required Braces or Orthoses: Spinal Brace Spinal Brace: Thoracolumbosacral orthotic Spinal Brace Comments: for comfort only to be donned EOB. (pt has not wanted to use thus far) Restrictions Weight Bearing Restrictions: No     Mobility  Bed Mobility Overal bed mobility: Needs Assistance Bed Mobility: Sit to Supine       Sit to supine: Min assist, Used rails Sit to sidelying: Min assist, HOB elevated, Used rails General bed mobility comments: Increased time required to perform sit>supine with assistance with BLE movement    Transfers Overall transfer level: Needs assistance Equipment used: Rolling walker (2 wheels) Transfers: Sit to/from Stand Sit to Stand: +2 physical assistance, Mod assist           General transfer comment: +2 assistance from recliner due to increase pain    Ambulation/Gait Ambulation/Gait assistance: Min assist, +2 safety/equipment Gait Distance (Feet): 60 Feet Assistive device: Rolling walker (2 wheels) Gait Pattern/deviations: Decreased step length - right, Decreased step length - left, Decreased stride length, Step-through pattern, Wide base of support Gait velocity: decreased     General Gait Details: Cuing to maintain RW close to trunk during amb; noted R LE buckling during amb requiring min A for restoring balance; x2 standing breaks for safety concerns. Chair follow for safety   Stairs             Wheelchair Mobility     Tilt Bed    Modified Rankin (Stroke Patients Only)       Balance Overall balance assessment: Needs assistance Sitting-balance support: Feet  supported, Bilateral upper extremity supported Sitting balance-Leahy Scale: Good Sitting balance - Comments: Able to maintain seated EOB and in recliner balance with heavy reliance on BUE to reduce back discomfort   Standing balance support: Bilateral upper extremity supported, During functional activity, Reliant on assistive device  for balance Standing balance-Leahy Scale: Fair Standing balance comment: Able to maintain static standing balance during functional activities with heavy reliance on BUE to reduce back discomfort; Cuing to reduce B shoulder shrug                            Cognition Arousal: Alert Behavior During Therapy: WFL for tasks assessed/performed Overall Cognitive Status: Within Functional Limits for tasks assessed                                 General Comments: Agreeable to session but limited by pain, especially when seated in recliner        Exercises      General Comments        Pertinent Vitals/Pain Pain Assessment Pain Assessment: 0-10 Pain Score: 8  Faces Pain Scale: Hurts even more Pain Location: lower back/sacral pain Pain Descriptors / Indicators: Discomfort, Sore, Guarding, Operative site guarding, Grimacing, Moaning Pain Intervention(s): Limited activity within patient's tolerance, Monitored during session, Premedicated before session, Repositioned    Home Living                          Prior Function            PT Goals (current goals can now be found in the care plan section) Acute Rehab PT Goals Patient Stated Goal: less pain PT Goal Formulation: With patient Time For Goal Achievement: 04/08/23 Potential to Achieve Goals: Good Progress towards PT goals: Progressing toward goals    Frequency    Min 1X/week      PT Plan      Co-evaluation              AM-PAC PT "6 Clicks" Mobility   Outcome Measure  Help needed turning from your back to your side while in a flat bed without using bedrails?: A Little Help needed moving from lying on your back to sitting on the side of a flat bed without using bedrails?: A Little Help needed moving to and from a bed to a chair (including a wheelchair)?: A Lot Help needed standing up from a chair using your arms (e.g., wheelchair or bedside chair)?: A Lot Help needed to walk  in hospital room?: A Lot Help needed climbing 3-5 steps with a railing? : Total 6 Click Score: 13    End of Session Equipment Utilized During Treatment: Gait belt Activity Tolerance: Patient limited by pain Patient left: with call bell/phone within reach;with nursing/sitter in room;Other (comment);in bed Nurse Communication: Mobility status PT Visit Diagnosis: Unsteadiness on feet (R26.81);Pain;Muscle weakness (generalized) (M62.81);Difficulty in walking, not elsewhere classified (R26.2) Pain - Right/Left:  (bilateral) Pain - part of body:  (low back)     Time: 1610-9604 PT Time Calculation (min) (ACUTE ONLY): 18 min  Charges:                            Elmon Else, SPT    Mysha Peeler 04/03/2023, 2:25 PM

## 2023-04-03 NOTE — Plan of Care (Signed)

## 2023-04-03 NOTE — Assessment & Plan Note (Addendum)
Resolved

## 2023-04-03 NOTE — Assessment & Plan Note (Addendum)
Will add MS Contin 15 mg twice a day.  Continue oxycodone.  Patient on as needed IV Dilaudid hopefully we can decrease the use of IV Dilaudid by using MS Contin.

## 2023-04-03 NOTE — Plan of Care (Signed)
  Problem: Education: Goal: Knowledge of General Education information will improve Description: Including pain rating scale, medication(s)/side effects and non-pharmacologic comfort measures Outcome: Progressing   Problem: Pain Managment: Goal: General experience of comfort will improve Outcome: Progressing   Problem: Safety: Goal: Ability to remain free from injury will improve Outcome: Progressing   Problem: Skin Integrity: Goal: Risk for impaired skin integrity will decrease Outcome: Progressing   Problem: Activity: Goal: Risk for activity intolerance will decrease Outcome: Progressing

## 2023-04-03 NOTE — Progress Notes (Signed)
Date of Admission:  03/23/2023      ID: Adam Cabrera is a 37 y.o. 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 Active Problems:   Obesity (BMI 30-39.9)   Mucous retention cyst of maxillary sinus   MSSA (methicillin susceptible Staphylococcus aureus) septicemia (HCC)   Psoas abscess, left (HCC)   Thrombocytopenia (HCC)   Fresh blood passed per rectum   MSSA bacteremia   Osteomyelitis (HCC)   Back pain   Hyponatremia    Subjective: Doing okay Sat in chair Some bucking in legs weakness  Medications:   gabapentin  300 mg Oral TID   hydrocortisone  25 mg Rectal TID   nicotine  21 mg Transdermal Daily   senna-docusate  2 tablet Oral BID   sodium chloride flush  3 mL Intravenous Q12H   traZODone  50 mg Oral QHS    Objective: Vital signs in last 24 hours: Patient Vitals for the past 24 hrs:  BP Temp Pulse Resp SpO2  04/03/23 1539 113/84 98.3 F (36.8 C) 98 14 98 %  04/03/23 0835 133/89 97.9 F (36.6 C) 85 14 99 %  04/02/23 2307 125/89 98.2 F (36.8 C) 91 20 100 %      PHYSICAL EXAM:  General: Alert, no distress  Lungs: Clear to auscultation bilaterally. No Wheezing or Rhonchi. No rales. Heart: Regular rate and rhythm, no murmur, rub or gallop. Abdomen: Soft, non-tender,not distended. Bowel sounds normal. No masses Extremities:edema legs Venous staining both legs rt > left Skin: No rashes or lesions. Or bruising Lymph: Cervical, supraclavicular normal. Tingling legs  Lab Results    Latest Ref Rng & Units 03/31/2023    9:41 PM 03/29/2023    5:15 AM 03/28/2023    5:23 AM  CBC  WBC 4.0 - 10.5 K/uL 7.2  5.2  6.0   Hemoglobin 13.0 - 17.0 g/dL 71.0  62.6  94.8   Hematocrit 39.0 - 52.0 % 36.3  34.2  33.1   Platelets 150 - 400 K/uL 176  156  158        Latest Ref Rng & Units 04/01/2023    4:47 AM 03/28/2023    5:23 AM 03/27/2023    2:40 AM  CMP  Glucose 70 - 99 mg/dL 546  270  92   BUN 6 - 20 mg/dL 8  7  8    Creatinine 0.61 - 1.24 mg/dL 3.50  0.93  8.18    Sodium 135 - 145 mmol/L 134  136  133   Potassium 3.5 - 5.1 mmol/L 3.7  4.0  3.7   Chloride 98 - 111 mmol/L 101  98  102   CO2 22 - 32 mmol/L 25  27  25    Calcium 8.9 - 10.3 mg/dL 8.9  9.0  8.4   Total Protein 6.5 - 8.1 g/dL 7.6   7.4   Total Bilirubin 0.3 - 1.2 mg/dL 1.1   1.1   Alkaline Phos 38 - 126 U/L 73   70   AST 15 - 41 U/L 20   15   ALT 0 - 44 U/L 14   18       Microbiology: 8/18 [facility]- MSSA 8/19 Surgical culture staph aureus 8/21 BC repeated Studies/Results: No results found.   Assessment/Plan: MSSA bacteremia Discitis and osteomyleitis L2-L3 With epidural abscess/phlegmon at the same level S/p laminectomy and debridement Repeat blood culture sent On nafcillin continuous infusion -will need for 6 weeks 2 d echo no vegetation TEE not needed  as it will not change management While on Nafcillin monitor renal and liver function and wbc  B/l psoas abscesses ,     Chronic Hepaittis C- has not ben treated- will need management as OP   Splenomegaly likely due to portal hypertension likely cirrhosis   IVDA- cannot be sent home with PICC line and IV antibiotic   Discussed the management with the patient and care team ?

## 2023-04-03 NOTE — Progress Notes (Signed)
Progress Note   Patient: Adam Cabrera VHQ:[id number] DOB: Oct 20, 1985 DOA: 03/23/2023     11 DOS: the patient was seen and examined on 04/03/2023   Brief hospital course:  Adam Cabrera is a 37 y.o. male with medical history significant for hypertension, scrotal cellulitis, history of IV drug use, presents with low back pain.  MRI of the lumbar spine showed Epidural extension with epidural phlegmon/abscess within the ventral epidural space extending from L1-2 through L3-4.  Blood culture positive for Staph aureus, patient treated with cefepime and vancomycin.  Patient has been seen by ID and neurosurgery, L spine washout performed 8/19. Patient condition had improved up to 8/26, wound VAC removed.  Patient will need 6 weeks of IV antibiotics.  Per TOC, there is no placement option.  Patient on continuous nafcillin infusion.    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  Patient on continuous nafcillin.  ID recommends 6 weeks total of IV antibiotics.    MSSA (methicillin susceptible Staphylococcus aureus) septicemia (HCC) Present on admission with tachycardia, tachypnea and epidural abscess.  Continue continuous IV nafcillin.  Osteomyelitis (HCC) Discitis and osteomyelitis of L2-L3.  Continue IV nafcillin.  Psoas abscess, left (HCC) Continue IV nafcillin.  Thrombocytopenia (HCC) Improved  Fresh blood passed per rectum Bleeding likely secondary to hemorrhoids.  Seen by GI.  Anusol suppositories.  Obesity (BMI 30-39.9) BMI 37.08   Hyponatremia Sodium 1 point less than normal range.  Back pain Likely postoperative but also could be secondary to infection and the hard chair.  Pain control.        Subjective: Patient seen after getting up into the chair.  Patient was having a lot of pain secondary to the chair being uncomfortable and had to keep on repositioning himself.  Nursing  staff was to give pain medication.  Admitted with epidural abscess and osteomyelitis and staph infection  Physical Exam: Vitals:   04/02/23 1603 04/02/23 2307 04/03/23 0835 04/03/23 1539  BP: 127/83 125/89 133/89 113/84  Pulse: 92 91 85 98  Resp: 18 20 14 14   Temp: 98.1 F (36.7 C) 98.2 F (36.8 C) 97.9 F (36.6 C) 98.3 F (36.8 C)  TempSrc:      SpO2: 99% 100% 99% 98%  Weight:      Height:       Physical Exam HENT:     Head: Normocephalic.     Mouth/Throat:     Pharynx: No oropharyngeal exudate.  Eyes:     General: Lids are normal.     Conjunctiva/sclera: Conjunctivae normal.  Cardiovascular:     Rate and Rhythm: Normal rate and regular rhythm.     Heart sounds: Normal heart sounds, S1 normal and S2 normal.  Pulmonary:     Breath sounds: No decreased breath sounds, wheezing, rhonchi or rales.  Abdominal:     Palpations: Abdomen is soft.     Tenderness: There is no abdominal tenderness.  Musculoskeletal:     Right lower leg: Swelling present.     Left lower leg: Swelling present.  Skin:    General: Skin is warm.     Findings: No rash.  Neurological:     Mental Status: He is alert and oriented to person, place, and time.     Comments: Patient with difficulty with right leg with straight leg raise.  Decreased sensation to light touch right leg.     Data Reviewed: Last sodium 134 last  creatinine 0.54, last hemoglobin 11.8.  Disposition: Status is: Inpatient Remains inpatient appropriate because: Patient will have to complete 6 weeks total of IV antibiotics while here in the hospital.  Planned Discharge Destination: Home    Time spent: 28 minutes  Author: Alford Highland, MD 04/03/2023 4:19 PM  For on call review [email or web address].

## 2023-04-03 NOTE — TOC Progression Note (Signed)
Transition of Care [facility]) - Progression Note    Patient Details  Name: MARQUEZE PEA MRN: [id number] Date of Birth: 12/10/1985  [facility]) CM/SW Contact  Marlowe Sax, RN Phone Number: 04/03/2023, 10:43 AM  Clinical Narrative:    TOC continues to follow for needs, will remain in the hospital for IV ABX    Expected Discharge Plan: Home/Self Care Barriers to Discharge: Continued Medical Work up  Expected Discharge Plan and Services                                               Social Determinants of Health (SDOH) Interventions SDOH Screenings   Tobacco Use: High Risk (03/24/2023)    Readmission Risk Interventions     No data to display

## 2023-04-04 DIAGNOSIS — G061 Intraspinal abscess and granuloma: Secondary | ICD-10-CM | POA: Diagnosis not present

## 2023-04-04 DIAGNOSIS — M4646 Discitis, unspecified, lumbar region: Secondary | ICD-10-CM | POA: Diagnosis not present

## 2023-04-04 DIAGNOSIS — M8600 Acute hematogenous osteomyelitis, unspecified site: Secondary | ICD-10-CM | POA: Diagnosis not present

## 2023-04-04 DIAGNOSIS — B9562 Methicillin resistant Staphylococcus aureus infection as the cause of diseases classified elsewhere: Secondary | ICD-10-CM | POA: Diagnosis not present

## 2023-04-04 DIAGNOSIS — D649 Anemia, unspecified: Secondary | ICD-10-CM | POA: Insufficient documentation

## 2023-04-04 DIAGNOSIS — K6812 Psoas muscle abscess: Secondary | ICD-10-CM | POA: Diagnosis not present

## 2023-04-04 DIAGNOSIS — M4626 Osteomyelitis of vertebra, lumbar region: Secondary | ICD-10-CM | POA: Diagnosis not present

## 2023-04-04 DIAGNOSIS — A4101 Sepsis due to Methicillin susceptible Staphylococcus aureus: Secondary | ICD-10-CM | POA: Diagnosis not present

## 2023-04-04 DIAGNOSIS — R519 Headache, unspecified: Secondary | ICD-10-CM | POA: Insufficient documentation

## 2023-04-04 LAB — BASIC METABOLIC PANEL
Anion gap: 9 (ref 5–15)
BUN: 9 mg/dL (ref 6–20)
CO2: 26 mmol/L (ref 22–32)
Calcium: 9.1 mg/dL (ref 8.9–10.3)
Chloride: 101 mmol/L (ref 98–111)
Creatinine, Ser: 0.54 mg/dL — ABNORMAL LOW (ref 0.61–1.24)
GFR, Estimated: >60 mL/min (ref >60)
Glucose, Bld: 90 mg/dL (ref 70–99)
Potassium: 3.7 mmol/L (ref 3.5–5.1)
Sodium: 136 mmol/L (ref 135–145)

## 2023-04-04 LAB — CBC
HCT: 33.5 % — ABNORMAL LOW (ref 39.0–52.0)
Hemoglobin: 10.9 g/dL — ABNORMAL LOW (ref 13.0–17.0)
MCH: 27.3 pg (ref 26.0–34.0)
MCHC: 32.5 g/dL (ref 30.0–36.0)
MCV: 84 fL (ref 80.0–100.0)
Platelets: 178 10*3/uL (ref 150–400)
RBC: 3.99 MIL/uL — ABNORMAL LOW (ref 4.22–5.81)
RDW: 17.9 % — ABNORMAL HIGH (ref 11.5–15.5)
WBC: 6.4 10*3/uL (ref 4.0–10.5)
nRBC: 0 % (ref 0.0–0.2)

## 2023-04-04 LAB — FERRITIN: Ferritin: 75 ng/mL (ref 24–336)

## 2023-04-04 MED ORDER — MAGNESIUM SULFATE 2 GM/50ML IV SOLN
2.0000 g | Freq: Once | INTRAVENOUS | Status: AC
  Administered 2023-04-04: 2 g via INTRAVENOUS
  Filled 2023-04-04: qty 50

## 2023-04-04 MED ORDER — KETOROLAC TROMETHAMINE 15 MG/ML IJ SOLN
15.0000 mg | Freq: Once | INTRAMUSCULAR | Status: AC
  Administered 2023-04-04: 15 mg via INTRAVENOUS
  Filled 2023-04-04: qty 1

## 2023-04-04 MED ORDER — ENOXAPARIN SODIUM 80 MG/0.8ML IJ SOSY
0.5000 mg/kg | PREFILLED_SYRINGE | INTRAMUSCULAR | Status: DC
  Administered 2023-04-04 – 2023-04-06 (×3): 65 mg via SUBCUTANEOUS
  Filled 2023-04-04 (×4): qty 0.65

## 2023-04-04 MED ORDER — BUTALBITAL-APAP-CAFFEINE 50-325-40 MG PO TABS
1.0000 | ORAL_TABLET | Freq: Four times a day (QID) | ORAL | Status: DC | PRN
  Administered 2023-04-04 – 2023-05-12 (×10): 1 via ORAL
  Filled 2023-04-04 (×10): qty 1

## 2023-04-04 NOTE — Progress Notes (Signed)
   04/04/23 1000  Spiritual Encounters  Type of Visit Initial  Care provided to: Patient  Referral source Chaplain assessment  Reason for visit Routine spiritual support  OnCall Visit No  Spiritual Framework  Presenting Themes Meaning/purpose/sources of inspiration;Significant life change  Patient Stress Factors Major life changes  Interventions  Spiritual Care Interventions Made Established relationship of care and support;Compassionate presence;Reflective listening;Prayer;Encouragement  Intervention Outcomes  Outcomes Connection to spiritual care  Spiritual Care Plan  Spiritual Care Issues Still Outstanding Chaplain will continue to follow   Patient is a recover drug user and 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 PT is remorseful for falling back into drugs use and is aware of what led him back down that path. He is motivate because he has a better relationship with his daughter. Prayer with the PT for change and that he can keep from falling back into the drug habit.

## 2023-04-04 NOTE — Assessment & Plan Note (Signed)
Last hemoglobin 10.9.  Add on a ferritin.

## 2023-04-04 NOTE — Progress Notes (Signed)
Date of Admission:  03/23/2023      ID: Adam Cabrera is a 37 y.o. 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 Active Problems:   Obesity (BMI 30-39.9)   Mucous retention cyst of maxillary sinus   MSSA (methicillin susceptible Staphylococcus aureus) septicemia (HCC)   Psoas abscess, left (HCC)   Thrombocytopenia (HCC)   Fresh blood passed per rectum   MSSA bacteremia   Osteomyelitis (HCC)   Back pain   Hyponatremia   Headache   Anemia, unspecified    Subjective: Doing okay Walked more with PT Still has pain stiffness back and legs  Medications:   enoxaparin (LOVENOX) injection  0.5 mg/kg Subcutaneous Q24H   gabapentin  300 mg Oral TID   hydrocortisone  25 mg Rectal TID   nicotine  21 mg Transdermal Daily   senna-docusate  2 tablet Oral BID   sodium chloride flush  3 mL Intravenous Q12H   traZODone  50 mg Oral QHS    Objective: Vital signs in last 24 hours: Patient Vitals for the past 24 hrs:  BP Temp Pulse Resp SpO2  04/04/23 1525 (!) 135/90 98.1 F (36.7 C) 99 14 99 %  04/04/23 0919 (!) 155/91 98.2 F (36.8 C) 89 14 100 %  04/04/23 0035 122/81 97.8 F (36.6 C) 84 18 98 %      PHYSICAL EXAM:  General: Alert, no distress  Lungs: Clear to auscultation bilaterally. No Wheezing or Rhonchi. No rales. Heart: Regular rate and rhythm, no murmur, rub or gallop. Abdomen: Soft, non-tender,not distended. Bowel sounds normal. No masses Extremities:edema legs Venous staining both legs rt > left Skin: No rashes or lesions. Or bruising Lymph: Cervical, supraclavicular normal. Neuro- motor weakness rt leg- 3-4/5  Lab Results    Latest Ref Rng & Units 04/04/2023    6:32 AM 03/31/2023    9:41 PM 03/29/2023    5:15 AM  CBC  WBC 4.0 - 10.5 K/uL 6.4  7.2  5.2   Hemoglobin 13.0 - 17.0 g/dL 51.7  61.6  07.3   Hematocrit 39.0 - 52.0 % 33.5  36.3  34.2   Platelets 150 - 400 K/uL 178  176  156        Latest Ref Rng & Units 04/04/2023    6:32 AM 04/01/2023    4:47  AM 03/28/2023    5:23 AM  CMP  Glucose 70 - 99 mg/dL 90  710  626   BUN 6 - 20 mg/dL 9  8  7    Creatinine 0.61 - 1.24 mg/dL 9.48  5.46  2.70   Sodium 135 - 145 mmol/L 136  134  136   Potassium 3.5 - 5.1 mmol/L 3.7  3.7  4.0   Chloride 98 - 111 mmol/L 101  101  98   CO2 22 - 32 mmol/L 26  25  27    Calcium 8.9 - 10.3 mg/dL 9.1  8.9  9.0   Total Protein 6.5 - 8.1 g/dL  7.6    Total Bilirubin 0.3 - 1.2 mg/dL  1.1    Alkaline Phos 38 - 126 U/L  73    AST 15 - 41 U/L  20    ALT 0 - 44 U/L  14        Microbiology: 8/18 [facility]- MSSA 8/19 Surgical culture staph aureus 8/21 BC repeated Studies/Results: No results found.   Assessment/Plan: MSSA bacteremia Discitis and osteomyleitis L2-L3 With epidural abscess/phlegmon at the same level S/p laminectomy and debridement  Repeat blood culture sent On nafcillin continuous infusion  make sure it is not stopped for any reason-will need for 6 weeks 2 d echo no vegetation TEE not needed as it will not change management While on Nafcillin monitor renal and liver function and wbc  B/l psoas abscesses ,     Chronic Hepaittis C- has not ben treated- will need management as OP   Splenomegaly likely due to portal hypertension likely cirrhosis   IVDA- cannot be sent home with PICC line and IV antibiotic   Discussed the management with the patient and care team ID will follow him peripherally this weekend [facility] on call by- available by phone this weekend ?

## 2023-04-04 NOTE — Plan of Care (Signed)
  Problem: Education: Goal: Knowledge of General Education information will improve Description: Including pain rating scale, medication(s)/side effects and non-pharmacologic comfort measures Outcome: Progressing   Problem: Health Behavior/Discharge Planning: Goal: Ability to manage health-related needs will improve Outcome: Progressing   Problem: Clinical Measurements: Goal: Ability to maintain clinical measurements within normal limits will improve Outcome: Progressing   Problem: Activity: Goal: Risk for activity intolerance will decrease Outcome: Progressing   Problem: Coping: Goal: Level of anxiety will decrease Outcome: Progressing   Problem: Elimination: Goal: Will not experience complications related to bowel motility Outcome: Progressing   

## 2023-04-04 NOTE — Plan of Care (Signed)

## 2023-04-04 NOTE — Progress Notes (Signed)
Physical Therapy Treatment Patient Details Name: Adam Cabrera MRN: [id number] DOB: 23-Oct-1985 Today's Date: 04/04/2023   History of Present Illness 37 y.o. male with medical history significant for hypertension, scrotal cellulitis, history of IV drug use, presents with low back pain.  MRI of the lumbar spine showed Epidural extension with epidural phlegmon/abscess within the ventral epidural space extending from L1-2 through L3-4 and s/p lumbar laminectomy    PT Comments  Pt was supine with HOB elevated ~ 15 degrees upon arrival. He is agreeable to session and remains motivated. Pt puts forth great effort throughout session even with c/o severe pain with all mobility, transfers and gait. Pt was able to stand from slightly elevated bed height with +1 assist. He tolerated increased gait distance however afterwards struggles to stand from w/c surface and toilet. He requires +2 assistance from standard height surfaces. PT will continue to follow closely advancing as able. Per chart review, pt may be admitted for several more weeks.     If plan is discharge home, recommend the following: A lot of help with bathing/dressing/bathroom;Assist for transportation;Help with stairs or ramp for entrance;A lot of help with walking and/or transfers;Assistance with cooking/housework     Equipment Recommendations  Other (comment) (will make more formal DME rec closer to Dc)       Precautions / Restrictions Precautions Precautions: Fall;Back Required Braces or Orthoses: Spinal Brace Spinal Brace: Thoracolumbosacral orthotic Spinal Brace Comments: for comfort only to be donned EOB. (pt has not wanted to use thus far) Restrictions Weight Bearing Restrictions: No     Mobility  Bed Mobility Overal bed mobility: Needs Assistance Bed Mobility: Rolling, Sidelying to Sit, Supine to Sit, Sit to Supine, Sit to Sidelying Rolling: Supervision Sidelying to sit: Min assist, Used rails Supine to sit: Min assist Sit to  supine: Min assist, Used rails Sit to sidelying: Min assist General bed mobility comments: Pt requires increased time to perfomr bed mobility. min assist however heavy use/reliance on UEs to perform. pain with all movements    Transfers Overall transfer level: Needs assistance Equipment used: Rolling walker (2 wheels) Transfers: Sit to/from Stand Sit to Stand: +2 physical assistance, +2 safety/equipment, Mod assist  General transfer comment: Pt was able to stand from slightly elevated bed height with +1 assistance. After ambulation pt required +2 assistance to stand from w/c and toilet level surfaces. Once in standing, only requires +1 assist    Ambulation/Gait Ambulation/Gait assistance: Min assist, Contact guard assist Gait Distance (Feet): 80 Feet Assistive device: Rolling walker (2 wheels) Gait Pattern/deviations: Decreased step length - right, Decreased step length - left, Decreased stride length, Step-through pattern, Wide base of support Gait velocity: decreased     General Gait Details: Pt presents with severe BLE weakness. No intervention required however pt has slight knee buckling at times. Vcs for wider BOS and posture correction. heavy reliance on RW due to pain   Stairs               Balance Overall balance assessment: Needs assistance Sitting-balance support: Feet supported, Bilateral upper extremity supported Sitting balance-Leahy Scale: Good     Standing balance support: Bilateral upper extremity supported, During functional activity, Reliant on assistive device for balance Standing balance-Leahy Scale: Poor Standing balance comment: reliant on BUE support in all standing activity     Cognition Arousal: Alert Behavior During Therapy: WFL for tasks assessed/performed Overall Cognitive Status: Within Functional Limits for tasks assessed      General Comments: Agreeable to session  but limited by pain           General Comments General comments (skin  integrity, edema, etc.): Pt had successful BM in BR on standard toilet.RN staff aware      Pertinent Vitals/Pain Pain Assessment Pain Assessment: 0-10 Pain Score: 8  Pain Location: BLEs in sitting/lower back/sacral pain Pain Descriptors / Indicators: Grimacing, Discomfort, Guarding, Moaning, Penetrating, Sharp, Spasm Pain Intervention(s): Limited activity within patient's tolerance, Monitored during session, Patient requesting pain meds-RN notified, Repositioned     PT Goals (current goals can now be found in the care plan section) Acute Rehab PT Goals Patient Stated Goal: less pain when moving Progress towards PT goals: Progressing toward goals    Frequency    Min 1X/week       Co-evaluation     PT goals addressed during session: Mobility/safety with mobility;Strengthening/ROM        AM-PAC PT "6 Clicks" Mobility   Outcome Measure  Help needed turning from your back to your side while in a flat bed without using bedrails?: A Little Help needed moving from lying on your back to sitting on the side of a flat bed without using bedrails?: A Little Help needed moving to and from a bed to a chair (including a wheelchair)?: A Lot Help needed standing up from a chair using your arms (e.g., wheelchair or bedside chair)?: A Lot Help needed to walk in hospital room?: A Little Help needed climbing 3-5 steps with a railing? : Total 6 Click Score: 14    End of Session   Activity Tolerance: Patient tolerated treatment well;Patient limited by pain Patient left: in bed;with call bell/phone within reach Nurse Communication: Mobility status PT Visit Diagnosis: Unsteadiness on feet (R26.81);Pain;Muscle weakness (generalized) (M62.81);Difficulty in walking, not elsewhere classified (R26.2) Pain - part of body: Hip     Time: 1100-1126 PT Time Calculation (min) (ACUTE ONLY): 26 min  Charges:    $Gait Training: 8-22 mins $Therapeutic Activity: 8-22 mins PT General Charges $$  ACUTE PT VISIT: 1 Visit                     Jetta Lout PTA 04/04/23, 5:06 PM

## 2023-04-04 NOTE — Assessment & Plan Note (Addendum)
As needed Fioricet. 

## 2023-04-04 NOTE — Progress Notes (Signed)
Progress Note   Patient: Adam Cabrera DOB: 09/08/1985 DOA: 03/23/2023     12 DOS: the patient was seen and examined on 04/04/2023   Brief hospital course:  Adam Cabrera is a 37 y.o. male with medical history significant for hypertension, scrotal cellulitis, history of IV drug use, presents with low back pain.  MRI of the lumbar spine showed Epidural extension with epidural phlegmon/abscess within the ventral epidural space extending from L1-2 through L3-4.  Blood culture positive for Staph aureus, patient treated with cefepime and vancomycin.  Patient has been seen by ID and neurosurgery, L spine washout performed 8/19. Patient condition had improved up to 8/26, wound VAC removed.  Patient will need 6 weeks of IV antibiotics.  Per TOC, there is no placement option.  Patient on continuous nafcillin infusion.    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  Patient on continuous nafcillin.  ID recommends 6 weeks total of IV antibiotics.    MSSA (methicillin susceptible Staphylococcus aureus) septicemia (HCC) Present on admission with tachycardia, tachypnea and epidural abscess.  Continue continuous IV nafcillin.  Osteomyelitis (HCC) Discitis and osteomyelitis of L2-L3.  Continue IV nafcillin.  Psoas abscess, left (HCC) Continue IV nafcillin.  Thrombocytopenia (HCC) Improved  Fresh blood passed per rectum Bleeding likely secondary to hemorrhoids.  Seen by GI.  Anusol suppositories.  Will start Lovenox to prevent DVT and monitor.  Obesity (BMI 30-39.9) BMI 37.08   Anemia, unspecified Last hemoglobin 10.9.  Add on a ferritin.  Headache Will give IV magnesium, 1 dose of Toradol and as needed Fioricet  Hyponatremia Sodium normalized  Back pain Likely postoperative but also could be secondary to infection and the hard chair.  Pain control.        Subjective: Patient  complaining of headache over the last few days.  Especially when he gets back pain.  Oral pain medication helps out a little bit.  Physical Exam: Vitals:   04/03/23 0835 04/03/23 1539 04/04/23 0035 04/04/23 0919  BP: 133/89 113/84 122/81 (!) 155/91  Pulse: 85 98 84 89  Resp: 14 14 18 14   Temp: 97.9 F (36.6 C) 98.3 F (36.8 C) 97.8 F (36.6 C) 98.2 F (36.8 C)  TempSrc:      SpO2: 99% 98% 98% 100%  Weight:      Height:       Physical Exam HENT:     Head: Normocephalic.     Mouth/Throat:     Pharynx: No oropharyngeal exudate.  Eyes:     General: Lids are normal.     Conjunctiva/sclera: Conjunctivae normal.  Cardiovascular:     Rate and Rhythm: Normal rate and regular rhythm.     Heart sounds: Normal heart sounds, S1 normal and S2 normal.  Pulmonary:     Breath sounds: No decreased breath sounds, wheezing, rhonchi or rales.  Abdominal:     Palpations: Abdomen is soft.     Tenderness: There is no abdominal tenderness.  Musculoskeletal:     Right lower leg: Swelling present.     Left lower leg: Swelling present.  Skin:    General: Skin is warm.     Findings: No rash.  Neurological:     Mental Status: He is alert and oriented to person, place, and time.     Data Reviewed: Creatinine 0.54, hemoglobin 10.9   Disposition: Status is: Inpatient Remains inpatient appropriate because: Will need 6 weeks of  IV antibiotics here in the hospital  Planned Discharge Destination: Home with Home Health    Time spent: 28 minutes  Author: Alford Highland, MD 04/04/2023 1:00 PM  For on call review [email or web address].

## 2023-04-05 DIAGNOSIS — M8600 Acute hematogenous osteomyelitis, unspecified site: Secondary | ICD-10-CM | POA: Diagnosis not present

## 2023-04-05 DIAGNOSIS — K6812 Psoas muscle abscess: Secondary | ICD-10-CM | POA: Diagnosis not present

## 2023-04-05 DIAGNOSIS — A4101 Sepsis due to Methicillin susceptible Staphylococcus aureus: Secondary | ICD-10-CM | POA: Diagnosis not present

## 2023-04-05 DIAGNOSIS — D508 Other iron deficiency anemias: Secondary | ICD-10-CM

## 2023-04-05 DIAGNOSIS — G061 Intraspinal abscess and granuloma: Secondary | ICD-10-CM | POA: Diagnosis not present

## 2023-04-05 DIAGNOSIS — L853 Xerosis cutis: Secondary | ICD-10-CM

## 2023-04-05 MED ORDER — AMMONIUM LACTATE 12 % EX LOTN
TOPICAL_LOTION | Freq: Two times a day (BID) | CUTANEOUS | Status: DC
  Administered 2023-04-08 – 2023-04-15 (×3): 1 via TOPICAL
  Filled 2023-04-05 (×2): qty 400

## 2023-04-05 NOTE — Plan of Care (Signed)
  Problem: Education: Goal: Knowledge of General Education information will improve Description Including pain rating scale, medication(s)/side effects and non-pharmacologic comfort measures Outcome: Progressing   Problem: Nutrition: Goal: Adequate nutrition will be maintained Outcome: Progressing   Problem: Elimination: Goal: Will not experience complications related to urinary retention Outcome: Progressing   Problem: Safety: Goal: Ability to remain free from injury will improve Outcome: Progressing   

## 2023-04-05 NOTE — Progress Notes (Signed)
Mobility Specialist - Progress Note    04/05/23 1246  Mobility  Activity Ambulated with assistance in hallway  Level of Assistance Contact guard assist, steadying assist  Assistive Device Front wheel walker  Distance Ambulated (ft) 80 ft  Range of Motion/Exercises Active  Activity Response Tolerated well  Mobility Referral Yes  $Mobility charge 1 Mobility  Mobility Specialist Start Time (ACUTE ONLY) 1225  Mobility Specialist Stop Time (ACUTE ONLY) 1246  Mobility Specialist Time Calculation (min) (ACUTE ONLY) 21 min   Pt resting in bed on RA upon entry. Pt +2 MinA STS and ambulates to hallway around NS with RW CGA. Pt had difficulty making turns with walking and took x1 standing rest break. Pt had minimal shaking in UE due to weakness. Pt endorsed pain in lower back throughout session. Pt returned to EOB and NT took over to perform hygiene care.   Johnathan Hausen Mobility Specialist 04/05/23, 1:27 PM

## 2023-04-05 NOTE — Progress Notes (Signed)
Progress Note   Patient: Adam Cabrera DOB: 1986/01/27 DOA: 03/23/2023     13 DOS: the patient was seen and examined on 04/05/2023   Brief hospital course:  Adam Cabrera is a 37 y.o. male with medical history significant for hypertension, scrotal cellulitis, history of IV drug use, presents with low back pain.  MRI of the lumbar spine showed Epidural extension with epidural phlegmon/abscess within the ventral epidural space extending from L1-2 through L3-4.  Blood culture positive for Staph aureus, patient treated with cefepime and vancomycin.  Patient has been seen by ID and neurosurgery, L spine washout performed 8/19. Patient condition had improved up to 8/26, wound VAC removed.  Patient will need 6 weeks of IV antibiotics.  Per TOC, there is no placement option.  Patient on continuous nafcillin infusion.    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  Patient on continuous nafcillin.  ID recommends 6 weeks total of IV antibiotics.  Check CMP P and CBC tomorrow.    MSSA (methicillin susceptible Staphylococcus aureus) septicemia (HCC) Present on admission with tachycardia, tachypnea and epidural abscess.  Continue continuous IV nafcillin.  Osteomyelitis (HCC) Discitis and osteomyelitis of L2-L3.  Continue IV nafcillin.  Psoas abscess, left (HCC) Continue IV nafcillin.  Thrombocytopenia (HCC) Improved  Fresh blood passed per rectum Bleeding likely secondary to hemorrhoids.  Seen by GI.  Anusol suppositories.  Will start Lovenox to prevent DVT and monitor.  Obesity (BMI 30-39.9) BMI 37.08   Dry skin Will give Lac-Hydrin lotion bilateral feet.  Has a callus that was bleeding.  Anemia, unspecified Last hemoglobin 10.9.  Add on a ferritin.  Headache Improved with IV magnesium and 1 dose of Toradol.  As needed Fioricet.  Hyponatremia Sodium normalized  Back  pain Likely postoperative but also could be secondary to infection and the hard chair.  Pain control.        Subjective: Patient states headache is better but still having back discomfort.  Even just turning side-to-side is painful for him.  Admitted with epidural abscess, osteomyelitis and Staph aureus bacteremia.  Physical Exam: Vitals:   04/04/23 0919 04/04/23 1525 04/04/23 2353 04/05/23 0800  BP: (!) 155/91 (!) 135/90 137/88 135/87  Pulse: 89 99 93 88  Resp: 14 14 18 14   Temp: 98.2 F (36.8 C) 98.1 F (36.7 C) 98.4 F (36.9 C) 98 F (36.7 C)  TempSrc:      SpO2: 100% 99% 100% 100%  Weight:      Height:       Physical Exam HENT:     Head: Normocephalic.     Mouth/Throat:     Pharynx: No oropharyngeal exudate.  Eyes:     General: Lids are normal.     Conjunctiva/sclera: Conjunctivae normal.  Cardiovascular:     Rate and Rhythm: Normal rate and regular rhythm.     Heart sounds: Normal heart sounds, S1 normal and S2 normal.  Pulmonary:     Breath sounds: No decreased breath sounds, wheezing, rhonchi or rales.  Abdominal:     Palpations: Abdomen is soft.     Tenderness: There is no abdominal tenderness.  Musculoskeletal:     Right lower leg: Swelling present.     Left lower leg: Swelling present.  Skin:    General: Skin is warm.     Comments: Dried scaling bilateral feet.  1 callus back of left foot with some dried blood on  it.  Neurological:     Mental Status: He is alert and oriented to person, place, and time.     Data Reviewed: Last hemoglobin 10.9  Disposition: Status is: Inpatient Remains inpatient appropriate because: Will require 6 weeks of antibiotics  Planned Discharge Destination: Home with Home Health    Time spent: 28 minutes  Author: Alford Highland, MD 04/05/2023 1:47 PM  For on call review [email or web address].

## 2023-04-05 NOTE — Progress Notes (Signed)
Occupational Therapy Treatment Patient Details Name: ILHAN ESCUE MRN: [id number] DOB: 10-03-1985 Today's Date: 04/05/2023   History of present illness 37 y.o. male with medical history significant for hypertension, scrotal cellulitis, history of IV drug use, presents with low back pain.  MRI of the lumbar spine showed Epidural extension with epidural phlegmon/abscess within the ventral epidural space extending from L1-2 through L3-4 and s/p lumbar laminectomy   OT comments  Pt reports he has had a difficult day, pain-wise, with 10/10 pain most of the day and unable to find any comfortable position. He had engaged in walking with mobility aide earlier today. Pt is able to perform side to side rolling in bed but cannot complete supine<sit at this time. Instead agreed to UE and LE therex in supine, including punches, extended reaches, arm lifts, heel slides, quad sets, ankle pumps. Able to complete LE therex more easily and fully on L side than on R. Pt made good effort, states he hopes to be able to continue with OOB routine tomorrow.       If plan is discharge home, recommend the following:  Two people to help with walking and/or transfers;Two people to help with bathing/dressing/bathroom;Assistance with cooking/housework;Assist for transportation;Help with stairs or ramp for entrance   Equipment Recommendations       Recommendations for Other Services      Precautions / Restrictions         Mobility Bed Mobility Overal bed mobility: Needs Assistance Bed Mobility: Rolling Rolling: Supervision         General bed mobility comments: Pt able to roll in bed w/o assistance; attempted EOB sitting but found it too painful    Transfers                         Balance                                           ADL either performed or assessed with clinical judgement   ADL                                              Extremity/Trunk  Assessment Upper Extremity Assessment Upper Extremity Assessment: Generalized weakness   Lower Extremity Assessment Lower Extremity Assessment: Generalized weakness        Vision       Perception     Praxis      Cognition Arousal: Alert Behavior During Therapy: WFL for tasks assessed/performed Overall Cognitive Status: Within Functional Limits for tasks assessed                                 General Comments: Agreeable to session but limited by pain        Exercises Other Exercises Other Exercises: UE and LE therex in supine    Shoulder Instructions       General Comments      Pertinent Vitals/ Pain       Pain Assessment Pain Assessment: 0-10 Pain Score: 10-Worst pain ever Pain Location: BLEs in sitting/lower back/sacral pain Pain Descriptors / Indicators: Grimacing, Discomfort, Guarding, Moaning, Penetrating, Sharp, Spasm Pain Intervention(s): Repositioned, Limited activity within patient's tolerance,  Monitored during session, Premedicated before session  Home Living                                          Prior Functioning/Environment              Frequency  Min 1X/week        Progress Toward Goals  OT Goals(current goals can now be found in the care plan section)  Progress towards OT goals: Progressing toward goals  Acute Rehab OT Goals OT Goal Formulation: With patient Time For Goal Achievement: 04/08/23 Potential to Achieve Goals: Good  Plan      Co-evaluation                 AM-PAC OT "6 Clicks" Daily Activity     Outcome Measure   Help from another person eating meals?: None Help from another person taking care of personal grooming?: A Little Help from another person toileting, which includes using toliet, bedpan, or urinal?: A Lot Help from another person bathing (including washing, rinsing, drying)?: A Little Help from another person to put on and taking off regular upper body  clothing?: A Little Help from another person to put on and taking off regular lower body clothing?: A Lot 6 Click Score: 17    End of Session    OT Visit Diagnosis: Unsteadiness on feet (R26.81);Muscle weakness (generalized) (M62.81)   Activity Tolerance Patient limited by pain   Patient Left in bed;with call bell/phone within reach;with bed alarm set   Nurse Communication          Time: 1610-9604 OT Time Calculation (min): 11 min  Charges: OT General Charges $OT Visit: 1 Visit OT Treatments $Therapeutic Exercise: 8-22 mins Latina Craver, PhD, MS, OTR/L 04/05/23, 4:08 PM

## 2023-04-05 NOTE — Assessment & Plan Note (Addendum)
Continue Lac-Hydrin and antifungal to bilateral feet.

## 2023-04-06 DIAGNOSIS — D509 Iron deficiency anemia, unspecified: Secondary | ICD-10-CM | POA: Insufficient documentation

## 2023-04-06 DIAGNOSIS — A4101 Sepsis due to Methicillin susceptible Staphylococcus aureus: Secondary | ICD-10-CM | POA: Diagnosis not present

## 2023-04-06 DIAGNOSIS — M8600 Acute hematogenous osteomyelitis, unspecified site: Secondary | ICD-10-CM | POA: Diagnosis not present

## 2023-04-06 DIAGNOSIS — G061 Intraspinal abscess and granuloma: Secondary | ICD-10-CM | POA: Diagnosis not present

## 2023-04-06 DIAGNOSIS — D696 Thrombocytopenia, unspecified: Secondary | ICD-10-CM | POA: Diagnosis not present

## 2023-04-06 LAB — CBC
HCT: 32.5 % — ABNORMAL LOW (ref 39.0–52.0)
Hemoglobin: 10.4 g/dL — ABNORMAL LOW (ref 13.0–17.0)
MCH: 27.4 pg (ref 26.0–34.0)
MCHC: 32 g/dL (ref 30.0–36.0)
MCV: 85.8 fL (ref 80.0–100.0)
Platelets: 154 10*3/uL (ref 150–400)
RBC: 3.79 MIL/uL — ABNORMAL LOW (ref 4.22–5.81)
RDW: 18.1 % — ABNORMAL HIGH (ref 11.5–15.5)
WBC: 5.5 10*3/uL (ref 4.0–10.5)
nRBC: 0 % (ref 0.0–0.2)

## 2023-04-06 LAB — COMPREHENSIVE METABOLIC PANEL
ALT: 12 U/L (ref 0–44)
AST: 15 U/L (ref 15–41)
Albumin: 3 g/dL — ABNORMAL LOW (ref 3.5–5.0)
Alkaline Phosphatase: 62 U/L (ref 38–126)
Anion gap: 7 (ref 5–15)
BUN: 10 mg/dL (ref 6–20)
CO2: 25 mmol/L (ref 22–32)
Calcium: 8.8 mg/dL — ABNORMAL LOW (ref 8.9–10.3)
Chloride: 102 mmol/L (ref 98–111)
Creatinine, Ser: 0.5 mg/dL — ABNORMAL LOW (ref 0.61–1.24)
GFR, Estimated: >60 mL/min (ref >60)
Glucose, Bld: 85 mg/dL (ref 70–99)
Potassium: 3.6 mmol/L (ref 3.5–5.1)
Sodium: 134 mmol/L — ABNORMAL LOW (ref 135–145)
Total Bilirubin: 1.1 mg/dL (ref 0.3–1.2)
Total Protein: 7.1 g/dL (ref 6.5–8.1)

## 2023-04-06 MED ORDER — FERROUS SULFATE 325 (65 FE) MG PO TABS
325.0000 mg | ORAL_TABLET | Freq: Every day | ORAL | Status: DC
  Administered 2023-04-07 – 2023-05-13 (×36): 325 mg via ORAL
  Filled 2023-04-06 (×37): qty 1

## 2023-04-06 NOTE — Assessment & Plan Note (Addendum)
Last hemoglobin 8.6.  Combination of iron deficiency anemia and postoperative anemia.  Patient on oral iron.  Likely hemorrhoids also contributing to anemia.  Anusol suppository as needed.

## 2023-04-06 NOTE — Plan of Care (Signed)
  Problem: Education: Goal: Knowledge of General Education information will improve Description Including pain rating scale, medication(s)/side effects and non-pharmacologic comfort measures Outcome: Progressing   Problem: Health Behavior/Discharge Planning: Goal: Ability to manage health-related needs will improve Outcome: Progressing   

## 2023-04-06 NOTE — Plan of Care (Signed)

## 2023-04-06 NOTE — Progress Notes (Signed)
Progress Note   Patient: Adam Cabrera:[id number] DOB: [birth year] DOA: 03/23/2023     14 DOS: the patient was seen and examined on 04/06/2023   Brief hospital course:  SOPHAL SHAKOOR is a 37 y.o. male with medical history significant for hypertension, scrotal cellulitis, history of IV drug use, presents with low back pain.  MRI of the lumbar spine showed Epidural extension with epidural phlegmon/abscess within the ventral epidural space extending from L1-2 through L3-4.  Blood culture positive for Staph aureus, patient treated with cefepime and vancomycin.  Patient has been seen by ID and neurosurgery, L spine washout performed 8/19. Patient condition had improved up to 8/26, wound VAC removed.  Patient will need 6 weeks of IV antibiotics.  Per TOC, there is no placement option.  Patient on continuous nafcillin infusion.    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  Patient on continuous nafcillin.  ID recommends 6 weeks total of IV antibiotics.  Today's LFTs and white blood cell count normal range.    MSSA (methicillin susceptible Staphylococcus aureus) septicemia (HCC) Present on admission with tachycardia, tachypnea and epidural abscess.  Continue continuous IV nafcillin.  Osteomyelitis (HCC) Discitis and osteomyelitis of L2-L3.  Continue IV nafcillin.  Psoas abscess, left (HCC) Continue IV nafcillin.  Thrombocytopenia (HCC) Improved.  Today's platelet count 154.  Fresh blood passed per rectum Bleeding likely secondary to hemorrhoids.  Seen by GI.  Anusol suppositories.  Will start Lovenox to prevent DVT and monitor.  Last hemoglobin 10.4.  Obesity (BMI 30-39.9) BMI 37.08   Iron deficiency anemia Last hemoglobin 10.4.  Will start ferrous sulfate.  Dry skin Will give Lac-Hydrin lotion bilateral feet.  Has a callus that was bleeding.  Headache Improved with IV magnesium and 1  dose of Toradol the other day.  As needed Fioricet.  Hyponatremia Sodium 1 point less than the normal range.  Back pain Pain a little bit less today than yesterday.  Continue with pain control.        Subjective: Patient got a new IV yesterday.  Feeling a little bit better with regards to his back pain.  Had a rough day yesterday.  Admitted with epidural abscess, osteomyelitis and staph aureus sepsis.  Physical Exam: Vitals:   04/05/23 1543 04/06/23 0043 04/06/23 0805 04/06/23 1619  BP: 139/87 126/77 126/88 131/86  Pulse: 99 79 86 93  Resp: 14 18 18 18   Temp: 98.3 F (36.8 C) 98.5 F (36.9 C) 98.1 F (36.7 C) 97.7 F (36.5 C)  TempSrc:      SpO2: 100% 98% 98% 100%  Weight:      Height:       Physical Exam HENT:     Head: Normocephalic.     Mouth/Throat:     Pharynx: No oropharyngeal exudate.  Eyes:     General: Lids are normal.     Conjunctiva/sclera: Conjunctivae normal.  Cardiovascular:     Rate and Rhythm: Normal rate and regular rhythm.     Heart sounds: Normal heart sounds, S1 normal and S2 normal.  Pulmonary:     Breath sounds: No decreased breath sounds, wheezing, rhonchi or rales.  Abdominal:     Palpations: Abdomen is soft.     Tenderness: There is no abdominal tenderness.  Musculoskeletal:     Right lower leg: Swelling present.     Left lower leg: Swelling present.  Skin:    General: Skin is  warm.     Comments: Dried scaling bilateral feet.  1 callus back of left foot with some dried blood on it.  Neurological:     Mental Status: He is alert and oriented to person, place, and time.     Data Reviewed: Sodium 134, creatinine 0.5, liver function test negative, white blood count 5.5, hemoglobin 10.4, platelet count 154  Disposition: Status is: Inpatient Remains inpatient appropriate because: Will need total of 6 weeks of IV antibiotics here in the hospital  Planned Discharge Destination: Home with Home Health    Time spent: 27  minutes  Author: Alford Highland, MD 04/06/2023 4:37 PM  For on call review [email or web address].

## 2023-04-06 NOTE — Progress Notes (Signed)
Mobility Specialist - Progress Note    04/06/23 1534  Mobility  Activity Ambulated with assistance in hallway  Level of Assistance Contact guard assist, steadying assist  Assistive Device Front wheel walker  Distance Ambulated (ft) 50 ft  Range of Motion/Exercises Active  Activity Response Tolerated well  Mobility Referral Yes  $Mobility charge 1 Mobility  Mobility Specialist Start Time (ACUTE ONLY) 1514  Mobility Specialist Stop Time (ACUTE ONLY) 1534  Mobility Specialist Time Calculation (min) (ACUTE ONLY) 20 min   Pt resting in bed on RA upon entry. Pt endorses pain in lower back but, agreeable to participate in ambulation. Pt STS MinA and ambulates to hallway around NS with RW CGA. Pt returned to bed and left with needs in reach.   Johnathan Hausen Mobility Specialist 04/06/23, 3:44 PM

## 2023-04-07 DIAGNOSIS — K6812 Psoas muscle abscess: Secondary | ICD-10-CM | POA: Diagnosis not present

## 2023-04-07 DIAGNOSIS — M8600 Acute hematogenous osteomyelitis, unspecified site: Secondary | ICD-10-CM | POA: Diagnosis not present

## 2023-04-07 DIAGNOSIS — G061 Intraspinal abscess and granuloma: Secondary | ICD-10-CM | POA: Diagnosis not present

## 2023-04-07 DIAGNOSIS — A4101 Sepsis due to Methicillin susceptible Staphylococcus aureus: Secondary | ICD-10-CM | POA: Diagnosis not present

## 2023-04-07 MED ORDER — ENOXAPARIN SODIUM 60 MG/0.6ML IJ SOSY
0.5000 mg/kg | PREFILLED_SYRINGE | INTRAMUSCULAR | Status: DC
  Administered 2023-04-07 – 2023-04-08 (×2): 55 mg via SUBCUTANEOUS
  Filled 2023-04-07 (×2): qty 0.6

## 2023-04-07 MED ORDER — KETOROLAC TROMETHAMINE 15 MG/ML IJ SOLN
15.0000 mg | Freq: Once | INTRAMUSCULAR | Status: AC
  Administered 2023-04-07: 15 mg via INTRAVENOUS
  Filled 2023-04-07: qty 1

## 2023-04-07 MED ORDER — SENNA 8.6 MG PO TABS: 1.0000 | ORAL_TABLET | Freq: Every day | ORAL | Status: DC | PRN

## 2023-04-07 NOTE — Plan of Care (Signed)
  Problem: Activity: Goal: Risk for activity intolerance will decrease Outcome: Progressing   Problem: Coping: Goal: Level of anxiety will decrease Outcome: Progressing   Problem: Nutrition: Goal: Adequate nutrition will be maintained Outcome: Progressing

## 2023-04-07 NOTE — Progress Notes (Signed)
Date of Admission:  03/23/2023      ID: Adam Cabrera is a 37 y.o. 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 Active Problems:   Obesity (BMI 30-39.9)   Mucous retention cyst of maxillary sinus   MSSA (methicillin susceptible Staphylococcus aureus) septicemia (HCC)   Psoas abscess, left (HCC)   Thrombocytopenia (HCC)   Fresh blood passed per rectum   MSSA bacteremia   Osteomyelitis (HCC)   Back pain   Hyponatremia   Headache   Dry skin   Iron deficiency anemia    Subjective: Improving Participated with PT  Medications:   ammonium lactate   Topical BID   enoxaparin (LOVENOX) injection  0.5 mg/kg Subcutaneous Q24H   ferrous sulfate  325 mg Oral Q breakfast   gabapentin  300 mg Oral TID   hydrocortisone  25 mg Rectal TID   nicotine  21 mg Transdermal Daily   senna-docusate  2 tablet Oral BID   sodium chloride flush  3 mL Intravenous Q12H   traZODone  50 mg Oral QHS    Objective: Vital signs in last 24 hours: Patient Vitals for the past 24 hrs:  BP Temp Pulse Resp SpO2 Weight  04/07/23 1544 130/82 97.8 F (36.6 C) 84 18 100 % --  04/07/23 0500 -- -- -- -- -- 112.2 kg  04/06/23 2326 120/80 98.3 F (36.8 C) 83 16 97 % --      PHYSICAL EXAM:  General: Alert, no distress  Lungs: Clear to auscultation bilaterally. No Wheezing or Rhonchi. No rales. Heart: Regular rate and rhythm, no murmur, rub or gallop. Abdomen: Soft, non-tender,not distended. Bowel sounds normal. No masses Extremities:edema legs Venous staining both legs rt > left Skin: No rashes or lesions. Or bruising Lymph: Cervical, supraclavicular normal. Neuro- motor weakness rt leg- 3-4/5  Lab Results    Latest Ref Rng & Units 04/06/2023    5:00 AM 04/04/2023    6:32 AM 03/31/2023    9:41 PM  CBC  WBC 4.0 - 10.5 K/uL 5.5  6.4  7.2   Hemoglobin 13.0 - 17.0 g/dL 96.0  45.4  09.8   Hematocrit 39.0 - 52.0 % 32.5  33.5  36.3   Platelets 150 - 400 K/uL 154  178  176        Latest Ref Rng &  Units 04/06/2023    5:00 AM 04/04/2023    6:32 AM 04/01/2023    4:47 AM  CMP  Glucose 70 - 99 mg/dL 85  90  119   BUN 6 - 20 mg/dL 10  9  8    Creatinine 0.61 - 1.24 mg/dL 1.47  8.29  5.62   Sodium 135 - 145 mmol/L 134  136  134   Potassium 3.5 - 5.1 mmol/L 3.6  3.7  3.7   Chloride 98 - 111 mmol/L 102  101  101   CO2 22 - 32 mmol/L 25  26  25    Calcium 8.9 - 10.3 mg/dL 8.8  9.1  8.9   Total Protein 6.5 - 8.1 g/dL 7.1   7.6   Total Bilirubin 0.3 - 1.2 mg/dL 1.1   1.1   Alkaline Phos 38 - 126 U/L 62   73   AST 15 - 41 U/L 15   20   ALT 0 - 44 U/L 12   14       Microbiology: 8/18 [facility]- MSSA 8/19 Surgical culture staph aureus 8/21 BC NG    Assessment/Plan: MSSA bacteremia  Discitis and osteomyleitis L2-L3 With epidural abscess/phlegmon at the same level S/p laminectomy and debridement Repeat blood culture sent On nafcillin continuous infusion  make sure it is not stopped for any reason- but because of some flow issues may be prudent to switch to either Q4 dose or IV ancef will need for 6 weeks- until 05/05/23 followed by PO cefadroxil 1 gm BID until 06/02/23 2 d echo no vegetation TEE not needed as it will not change management While on Nafcillin monitor renal and liver function and wbc  B/l psoas abscesses ,     Chronic Hepaittis C- has not ben treated- will need management as OP   Splenomegaly likely due to portal hypertension likely cirrhosis   IVDA- cannot be sent home with PICC line and IV antibiotic   Discussed the management with the patient   [facility] will be following this patient from tomorrow( 9/3) for 2 weeks?call if needed. Antimicrobial stewardship pharmacist also available

## 2023-04-07 NOTE — Progress Notes (Signed)
Progress Note   Patient: Adam Cabrera OZH:[id number] DOB: 06-Nov-1985 DOA: 03/23/2023     15 DOS: the patient was seen and examined on 04/07/2023   Brief hospital course:  Adam Cabrera is a 37 y.o. male with medical history significant for hypertension, scrotal cellulitis, history of IV drug use, presents with low back pain.  MRI of the lumbar spine showed Epidural extension with epidural phlegmon/abscess within the ventral epidural space extending from L1-2 through L3-4.  Blood culture positive for Staph aureus, patient treated with cefepime and vancomycin.  Patient has been seen by ID and neurosurgery, L spine washout performed 8/19. Patient condition had improved up to 8/26, wound VAC removed.  Patient will need 6 weeks of IV antibiotics.  Per TOC, there is no placement option.  Patient on continuous nafcillin infusion.    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  Patient on continuous nafcillin.  ID recommends 6 weeks total of IV antibiotics.  Yesterday's LFTs and white blood cell count normal range.    MSSA (methicillin susceptible Staphylococcus aureus) septicemia (HCC) Present on admission with tachycardia, tachypnea and epidural abscess.  Continue continuous IV nafcillin.  Osteomyelitis (HCC) Discitis and osteomyelitis of L2-L3.  Continue IV nafcillin.  Psoas abscess, left (HCC) Continue IV nafcillin.  Thrombocytopenia (HCC) Improved.  Last platelet count 154.  Fresh blood passed per rectum Bleeding likely secondary to hemorrhoids.  Seen by GI.  Anusol suppositories.  Continue Lovenox to prevent DVT and monitor.  Last hemoglobin 10.4.  Obesity (BMI 30-39.9) BMI 31.76   Iron deficiency anemia Last hemoglobin 10.4.  Continue ferrous sulfate.  Dry skin Will give Lac-Hydrin lotion bilateral feet.  Has a callus that was bleeding.  Headache Improved with IV magnesium and 1 dose  of Toradol the other day.  As needed Fioricet.  Hyponatremia Sodium 1 point less than the normal range.  Back pain Continue with pain control.  Will give 1 dose of Toradol.        Subjective: Patient feeling okay today.  Still having some lingering back pain from the other day when he turned in the bed.  Still having weakness with his right leg.  Admitted with Staph aureus sepsis, osteomyelitis and epidural abscess.  Physical Exam: Vitals:   04/06/23 1619 04/06/23 2326 04/07/23 0500 04/07/23 1544  BP: 131/86 120/80  130/82  Pulse: 93 83  84  Resp: 18 16  18   Temp: 97.7 F (36.5 C) 98.3 F (36.8 C)  97.8 F (36.6 C)  TempSrc:      SpO2: 100% 97%  100%  Weight:   112.2 kg   Height:       Physical Exam HENT:     Head: Normocephalic.     Mouth/Throat:     Pharynx: No oropharyngeal exudate.  Eyes:     General: Lids are normal.     Conjunctiva/sclera: Conjunctivae normal.  Cardiovascular:     Rate and Rhythm: Normal rate and regular rhythm.     Heart sounds: Normal heart sounds, S1 normal and S2 normal.  Pulmonary:     Breath sounds: No decreased breath sounds, wheezing, rhonchi or rales.  Abdominal:     Palpations: Abdomen is soft.     Tenderness: There is no abdominal tenderness.  Musculoskeletal:     Right lower leg: Swelling present.     Left lower leg: Swelling present.  Skin:    General: Skin is warm.  Comments: Dried scaling bilateral feet.  1 callus back of left foot with some dried blood on it.  Neurological:     Mental Status: He is alert and oriented to person, place, and time.     Comments: Barely able to straight leg raise with right leg.  Able to straight leg raise with left leg.     Data Reviewed: Creatinine 0.5, hemoglobin 10.4  Disposition: Status is: Inpatient Remains inpatient appropriate because: Will need 6 weeks of IV antibiotics  Planned Discharge Destination: Home    Time spent: 28 minutes  Author: Alford Highland,  MD 04/07/2023 4:10 PM  For on call review [email or web address].

## 2023-04-07 NOTE — Progress Notes (Signed)
Physical Therapy Treatment Patient Details Name: Adam Cabrera MRN: [id number] DOB: [birth year] Today's Date: 04/07/2023   History of Present Illness 37 y.o. male with medical history significant for hypertension, scrotal cellulitis, history of IV drug use, presents with low back pain.  MRI of the lumbar spine showed Epidural extension with epidural phlegmon/abscess within the ventral epidural space extending from L1-2 through L3-4 and s/p lumbar laminectomy    PT Comments  Pt was supine in bed upon arrival. He is A and O x 4 and remains cooperative and pleasant. Continues to endorse pain but overall improved slightly from previous PT sessions observed. He was able to roll R to short sit with min assist. Did require more assistance to return to bed after OOB activity. Pt stood form minimally elevated bed height and ambulated 160 ft around [employer], [employer]. Overall continues to be pain limited but is progressing well towards rehab goals. Acute PT will continue to follow and progress towards PLOF while maximizing his independence with all ADLs.    If plan is discharge home, recommend the following: A lot of help with walking and/or transfers;A lot of help with bathing/dressing/bathroom;Assistance with cooking/housework;Direct supervision/assist for medications management;Direct supervision/assist for financial management;Assist for transportation;Help with stairs or ramp for entrance     Equipment Recommendations  Other (comment) (ongoing assessment. pt is progressing. Will have more formal DME reccommendation prior to DC)       Precautions / Restrictions Precautions Precautions: Fall;Back Required Braces or Orthoses: Spinal Brace Spinal Brace: Thoracolumbosacral orthotic Spinal Brace Comments: for comfort only to be donned EOB. (pt has not wanted to use thus far) Restrictions Weight Bearing Restrictions: No     Mobility  Bed Mobility Overal bed mobility: Needs Assistance Bed Mobility: Rolling,  Sidelying to Sit, Supine to Sit, Sit to Supine, Sit to Sidelying Rolling: Supervision Sidelying to sit: Min assist, Used rails Supine to sit: Min assist Sit to supine: Mod assist, Used rails Sit to sidelying: Mod assist General bed mobility comments: BLEs was able to roll to R to short sit with min assist. Mod assist to return to supine after OOB activity    Transfers Overall transfer level: Needs assistance Equipment used: Rolling walker (2 wheels) Transfers: Sit to/from Stand Sit to Stand: Min assist, From elevated surface    General transfer comment: pt require min assist fo one to stand form slightly elevated bed height. Less weakness observed in BLEs from previous session observed    Ambulation/Gait Ambulation/Gait assistance: Contact guard assist Gait Distance (Feet): 160 Feet Assistive device: Rolling walker (2 wheels) Gait Pattern/deviations: Decreased step length - right, Decreased step length - left, Decreased stride length, Step-through pattern, Wide base of support Gait velocity: decreased  General Gait Details: pt was able to ambulate 160 ft around RN station without LOB or safty concerns. Occasional VC for wider BOS. Pts gait is improving but transfers are progressing slower. will need to continue to improve transfer abilities from lower surface heights    Balance Overall balance assessment: Needs assistance Sitting-balance support: Feet supported, Bilateral upper extremity supported Sitting balance-Leahy Scale: Good     Standing balance support: Bilateral upper extremity supported, During functional activity, Reliant on assistive device for balance Standing balance-Leahy Scale: Fair Standing balance comment: reliant on BUE support in all standing activity       Cognition Arousal: Alert Behavior During Therapy: WFL for tasks assessed/performed Overall Cognitive Status: Within Functional Limits for tasks assessed      General Comments: Agreeable to session  but  limited by pain               Pertinent Vitals/Pain Pain Assessment Pain Assessment: 0-10 Pain Score: 7  Faces Pain Scale: Hurts whole lot Pain Location: BLEs in sitting/lower back/sacral pain Pain Descriptors / Indicators: Grimacing, Discomfort, Guarding, Moaning, Penetrating, Sharp, Spasm Pain Intervention(s): Limited activity within patient's tolerance, Monitored during session, Premedicated before session, Repositioned     PT Goals (current goals can now be found in the care plan section) Acute Rehab PT Goals Patient Stated Goal: less pain when moving Progress towards PT goals: Progressing toward goals    Frequency    Min 1X/week       Co-evaluation     PT goals addressed during session: Mobility/safety with mobility;Balance;Proper use of DME;Strengthening/ROM        AM-PAC PT "6 Clicks" Mobility   Outcome Measure  Help needed turning from your back to your side while in a flat bed without using bedrails?: A Little Help needed moving from lying on your back to sitting on the side of a flat bed without using bedrails?: A Lot Help needed moving to and from a bed to a chair (including a wheelchair)?: A Lot Help needed standing up from a chair using your arms (e.g., wheelchair or bedside chair)?: A Lot Help needed to walk in hospital room?: A Little Help needed climbing 3-5 steps with a railing? : A Lot 6 Click Score: 14    End of Session   Activity Tolerance: Patient tolerated treatment well;Patient limited by pain Patient left: in bed;with call bell/phone within reach (pt is unable to get comfortable in recliners) Nurse Communication: Mobility status PT Visit Diagnosis: Unsteadiness on feet (R26.81);Pain;Muscle weakness (generalized) (M62.81);Difficulty in walking, not elsewhere classified (R26.2)     Time: 1610-9604 PT Time Calculation (min) (ACUTE ONLY): 14 min  Charges:    $Gait Training: 8-22 mins PT General Charges $$ ACUTE PT VISIT: 1  Visit                    Jetta Lout PTA 04/07/23, 1:12 PM

## 2023-04-07 NOTE — Plan of Care (Signed)
?  Problem: Health Behavior/Discharge Planning: ?Goal: Ability to manage health-related needs will improve ?Outcome: Progressing ?  ?Problem: Activity: ?Goal: Risk for activity intolerance will decrease ?Outcome: Progressing ?  ?Problem: Elimination: ?Goal: Will not experience complications related to bowel motility ?Outcome: Progressing ?  ?

## 2023-04-08 ENCOUNTER — Encounter: Payer: Medicaid Other | Admitting: Orthopedic Surgery

## 2023-04-08 DIAGNOSIS — K6812 Psoas muscle abscess: Secondary | ICD-10-CM | POA: Diagnosis not present

## 2023-04-08 DIAGNOSIS — A4101 Sepsis due to Methicillin susceptible Staphylococcus aureus: Secondary | ICD-10-CM | POA: Diagnosis not present

## 2023-04-08 DIAGNOSIS — G061 Intraspinal abscess and granuloma: Secondary | ICD-10-CM | POA: Diagnosis not present

## 2023-04-08 DIAGNOSIS — M8600 Acute hematogenous osteomyelitis, unspecified site: Secondary | ICD-10-CM | POA: Diagnosis not present

## 2023-04-08 NOTE — Plan of Care (Signed)

## 2023-04-08 NOTE — Progress Notes (Signed)
Occupational Therapy Re-Evaluation  Patient Details Name: Adam Cabrera MRN: [id number] DOB: 03-27-1986 Today's Date: 04/08/2023   History of present illness 37 y.o. male with medical history significant for hypertension, scrotal cellulitis, history of IV drug use, presents with low back pain.  MRI of the lumbar spine showed Epidural extension with epidural phlegmon/abscess within the ventral epidural space extending from L1-2 through L3-4 and s/p lumbar laminectomy   OT comments  Chart reviewed to date, pt greeted in bed, agreeable to OT re-evaluation. Pt is alert and oriented x4. Tx Pt has made progress towards goals since evaluation, therefore goals have been upgraded. Specifically, pt made progress on this date with STS from lower surfaces with MOD A with RW, frequent verbal cues for technique, toileting with supervision, amb in hallway with RW approx 120' with frequent vcs for technique. Pt continues to perform ADL below PLOF however and will continue to benefit from acute OT to address functional deficits.       If plan is discharge home, recommend the following:  Two people to help with walking and/or transfers;Two people to help with bathing/dressing/bathroom;Assistance with cooking/housework;Assist for transportation;Help with stairs or ramp for entrance   Equipment Recommendations  BSC/3in1    Recommendations for Other Services      Precautions / Restrictions Precautions Precautions: Fall;Back Required Braces or Orthoses: Spinal Brace Spinal Brace: Thoracolumbosacral orthotic Spinal Brace Comments: for comfort only- pt declined on this date Restrictions Weight Bearing Restrictions: No       Mobility Bed Mobility Overal bed mobility: Needs Assistance Bed Mobility: Sidelying to Sit, Rolling, Sit to Sidelying Rolling: Supervision, Used rails Sidelying to sit: Min assist, Used rails     Sit to sidelying: Mod assist, HOB elevated, Used rails (for managment of BLE)       Transfers Overall transfer level: Needs assistance Equipment used: Rolling walker (2 wheels) Transfers: Sit to/from Stand Sit to Stand: Min assist, Mod assist (regular bed height, frequent vcs for body mechanics)                 Balance Overall balance assessment: Needs assistance Sitting-balance support: Feet supported, Bilateral upper extremity supported Sitting balance-Leahy Scale: Good     Standing balance support: Bilateral upper extremity supported, During functional activity, Reliant on assistive device for balance Standing balance-Leahy Scale: Fair                             ADL either performed or assessed with clinical judgement   ADL Overall ADL's : Needs assistance/impaired Eating/Feeding: Set up;Sitting   Grooming: Set up;Sitting           Upper Body Dressing : Minimal assistance;Sitting   Lower Body Dressing: Maximal assistance;Sitting/lateral leans   Toilet Transfer: Ambulation;Moderate assistance;Maximal assistance Toilet Transfer Details (indicate cue type and reason): low toilet height Toileting- Clothing Manipulation and Hygiene: Minimal assistance;Sitting/lateral lean Toileting - Clothing Manipulation Details (indicate cue type and reason): continent BM on toilet     Functional mobility during ADLs: Minimal assistance;Rolling walker (2 wheels) (approx 120', one instance of RLE buckling) General ADL Comments: frequent vcs for body mechanics/technique with RW    Extremity/Trunk Assessment Upper Extremity Assessment Upper Extremity Assessment: Generalized weakness   Lower Extremity Assessment Lower Extremity Assessment: Generalized weakness        Vision Patient Visual Report: No change from baseline     Perception     Praxis      Cognition Arousal: Alert  Behavior During Therapy: WFL for tasks assessed/performed Overall Cognitive Status: Within Functional Limits for tasks assessed                                           Exercises      Shoulder Instructions       General Comments spo2 98% on RA, HR 98 bpm after mobility/toileting tasks    Pertinent Vitals/ Pain       Pain Assessment Pain Assessment: 0-10 Pain Score: 10-Worst pain ever Pain Location: LBP Pain Descriptors / Indicators: Grimacing, Discomfort, Guarding, Moaning, Sharp, Spasm Pain Intervention(s): Limited activity within patient's tolerance, Monitored during session, Premedicated before session, Repositioned  Home Living                                          Prior Functioning/Environment              Frequency  Min 1X/week        Progress Toward Goals  OT Goals(current goals can now be found in the care plan section)  Progress towards OT goals: Progressing toward goals  Acute Rehab OT Goals Patient Stated Goal: improve activity tolerance OT Goal Formulation: With patient Time For Goal Achievement: 04/22/23 Potential to Achieve Goals: Good ADL Goals Pt Will Perform Grooming: with contact guard assist;standing Pt Will Perform Lower Body Dressing: with min assist;sit to/from stand Pt Will Transfer to Toilet: with contact guard assist;ambulating  Plan      Co-evaluation                 AM-PAC OT "6 Clicks" Daily Activity     Outcome Measure   Help from another person eating meals?: None Help from another person taking care of personal grooming?: A Little Help from another person toileting, which includes using toliet, bedpan, or urinal?: A Lot Help from another person bathing (including washing, rinsing, drying)?: A Little Help from another person to put on and taking off regular upper body clothing?: A Little Help from another person to put on and taking off regular lower body clothing?: A Lot 6 Click Score: 17    End of Session Equipment Utilized During Treatment: Rolling walker (2 wheels);Gait belt  OT Visit Diagnosis: Unsteadiness on feet (R26.81);Muscle  weakness (generalized) (M62.81)   Activity Tolerance Patient limited by pain   Patient Left in bed;with call bell/phone within reach;with bed alarm set   Nurse Communication Mobility status        Time: 1610-9604 OT Time Calculation (min): 40 min  Charges: OT General Charges $OT Visit: 1 Visit OT Evaluation $OT Re-eval: 1 Re-eval OT Treatments $Self Care/Home Management : 23-37 mins $Therapeutic Activity: 8-22 mins  Oleta Mouse, OTD OTR/L  04/08/23, 11:52 AM

## 2023-04-08 NOTE — Progress Notes (Signed)
Progress Note   Patient: Adam Cabrera IOE:[id number] DOB: April 27, 1986 DOA: 03/23/2023     16 DOS: the patient was seen and examined on 04/08/2023   Brief hospital course: 37 y.o. male with medical history significant for hypertension, scrotal cellulitis, history of IV drug use, presents with low back pain.  MRI of the lumbar spine showed Epidural extension with epidural phlegmon/abscess within the ventral epidural space extending from L1-2 through L3-4.  Blood culture positive for Staph aureus, patient treated with cefepime and vancomycin.  Patient has been seen by ID and neurosurgery, L spine washout performed 8/19. Patient condition had improved up to 8/26, wound VAC removed.  Patient will need 6 weeks of IV antibiotics.  Per TOC, there is no placement option.  Patient currently on on continuous nafcillin infusion.    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  Patient on continuous nafcillin.  ID recommends 6 weeks total of IV antibiotics.      MSSA (methicillin susceptible Staphylococcus aureus) septicemia (HCC) Present on admission with tachycardia, tachypnea and epidural abscess.  Continue continuous IV nafcillin.  Osteomyelitis (HCC) Discitis and osteomyelitis of L2-L3.  Continue IV nafcillin.  Psoas abscess, left (HCC) Continue IV nafcillin.  Thrombocytopenia (HCC) Improved.  Last platelet count 154.  Fresh blood passed per rectum Bleeding likely secondary to hemorrhoids.  Seen by GI.  Anusol suppositories.  Continue Lovenox to prevent DVT and monitor.  Last hemoglobin 10.4.  Obesity (BMI 30-39.9) BMI 31.42   Iron deficiency anemia Last hemoglobin 10.4.  Continue ferrous sulfate.  Dry skin Will give Lac-Hydrin lotion bilateral feet.  Has a callus that was bleeding.  Headache Improved with IV magnesium and 1 dose of Toradol.  As needed Fioricet.  Hyponatremia Sodium 1 point  less than the normal range.  Back pain Continue with pain control.        Subjective: Patient still with some right leg weakness and back pain.  Admitted with epidural abscess, osteomyelitis and Staph aureus sepsis.  Physical Exam: Vitals:   04/07/23 1544 04/07/23 2353 04/08/23 0500 04/08/23 0850  BP: 130/82 123/80  118/85  Pulse: 84 86  80  Resp: 18 18  20   Temp: 97.8 F (36.6 C) 97.8 F (36.6 C)  98.7 F (37.1 C)  TempSrc:    Oral  SpO2: 100% 99%  100%  Weight:   111 kg   Height:       Physical Exam HENT:     Head: Normocephalic.     Mouth/Throat:     Pharynx: No oropharyngeal exudate.  Eyes:     General: Lids are normal.     Conjunctiva/sclera: Conjunctivae normal.  Cardiovascular:     Rate and Rhythm: Normal rate and regular rhythm.     Heart sounds: Normal heart sounds, S1 normal and S2 normal.  Pulmonary:     Breath sounds: No decreased breath sounds, wheezing, rhonchi or rales.  Abdominal:     Palpations: Abdomen is soft.     Tenderness: There is no abdominal tenderness.  Musculoskeletal:     Right lower leg: Swelling present.     Left lower leg: Swelling present.  Skin:    General: Skin is warm.     Comments: Dried scaling bilateral feet.  1 callus back of left foot with some dried blood on it.  Neurological:     Mental Status: He is alert and oriented to person, place, and time.  Comments: Barely able to straight leg raise with right leg.  Able to straight leg raise with left leg.     Data Reviewed: Last sodium 134, creatinine 0.5, white blood cell count 5.5, hemoglobin 10.4, platelet count 154   Disposition: Status is: Inpatient Remains inpatient appropriate because: Will need 6 weeks of IV antibiotics  Planned Discharge Destination: Home with Home Health    Time spent: 27 minutes  Author: Alford Highland, MD 04/08/2023 5:43 PM  For on call review [email or web address].

## 2023-04-08 NOTE — Progress Notes (Signed)
Physical Therapy Treatment Patient Details Name: Adam Cabrera MRN: [id number] DOB: 1986/03/29 Today's Date: 04/08/2023   History of Present Illness 37 y.o. male with medical history significant for hypertension, scrotal cellulitis, history of IV drug use, presents with low back pain.  MRI of the lumbar spine showed Epidural extension with epidural phlegmon/abscess within the ventral epidural space extending from L1-2 through L3-4 and s/p lumbar laminectomy    PT Comments  Pt walked this AM with OT and requested PM session.  Pt able to transition to sitting with min a x 1 with increased back pain.  He is given time in sitting to settle but no change in pain . Stood from elevated bed with min a x 1 and stated pain 10/10 in back and RLE with difficulty stepping and WB RLE  heavy forward lean on walker and unable to progress gait at this time.  He is assisted back to bed with mod/max a x 1 for LE's for comfort where pain continued.  RN aware of pain and request for pain medication.     If plan is discharge home, recommend the following: A lot of help with walking and/or transfers;A lot of help with bathing/dressing/bathroom;Assistance with cooking/housework;Direct supervision/assist for medications management;Direct supervision/assist for financial management;Assist for transportation;Help with stairs or ramp for entrance   Can travel by private vehicle        Equipment Recommendations  Other (comment) (ongoing assessment. pt is progressing. Will have more formal DME reccommendation prior to DC)    Recommendations for Other Services       Precautions / Restrictions Precautions Precautions: Fall;Back Required Braces or Orthoses: Spinal Brace Spinal Brace: Thoracolumbosacral orthotic Spinal Brace Comments: for comfort only- pt declined on this date Restrictions Weight Bearing Restrictions: No     Mobility  Bed Mobility Overal bed mobility: Needs Assistance   Rolling: Supervision, Used  rails   Supine to sit: Min assist          Transfers       Sit to Stand: Min assist                Ambulation/Gait               General Gait Details: unable due to pain   Stairs             Wheelchair Mobility     Tilt Bed    Modified Rankin (Stroke Patients Only)       Balance Overall balance assessment: Needs assistance Sitting-balance support: Feet supported, Bilateral upper extremity supported Sitting balance-Leahy Scale: Good     Standing balance support: Bilateral upper extremity supported, During functional activity, Reliant on assistive device for balance Standing balance-Leahy Scale: Fair                              Cognition Arousal: Alert Behavior During Therapy: WFL for tasks assessed/performed Overall Cognitive Status: Within Functional Limits for tasks assessed                                          Exercises      General Comments General comments (skin integrity, edema, etc.): spo2 98% on RA, HR 98 bpm after mobility/toileting tasks      Pertinent Vitals/Pain Pain Assessment Pain Assessment: 0-10 Pain Score: 10-Worst pain ever Pain Location: some  discomfort in supine but agrees to gait.  increased to 10/10 primarily back and rigth leg with standing unable to progress gait. Pain Descriptors / Indicators: Grimacing, Discomfort, Guarding, Moaning, Sharp, Spasm Pain Intervention(s): Patient requesting pain meds-RN notified, Repositioned    Home Living                          Prior Function            PT Goals (current goals can now be found in the care plan section) Progress towards PT goals: Progressing toward goals    Frequency    Min 1X/week      PT Plan      Co-evaluation              AM-PAC PT "6 Clicks" Mobility   Outcome Measure  Help needed turning from your back to your side while in a flat bed without using bedrails?: A Little Help needed  moving from lying on your back to sitting on the side of a flat bed without using bedrails?: A Lot Help needed moving to and from a bed to a chair (including a wheelchair)?: A Lot Help needed standing up from a chair using your arms (e.g., wheelchair or bedside chair)?: A Lot Help needed to walk in hospital room?: A Little Help needed climbing 3-5 steps with a railing? : A Lot 6 Click Score: 14    End of Session   Activity Tolerance: Patient tolerated treatment well;Patient limited by pain Patient left: in bed;with call bell/phone within reach (pt is unable to get comfortable in recliners) Nurse Communication: Mobility status PT Visit Diagnosis: Unsteadiness on feet (R26.81);Pain;Muscle weakness (generalized) (M62.81);Difficulty in walking, not elsewhere classified (R26.2)     Time: 1212-1230 PT Time Calculation (min) (ACUTE ONLY): 18 min  Charges:    $Therapeutic Activity: 8-22 mins PT General Charges $$ ACUTE PT VISIT: 1 Visit                   Danielle Dess, PTA 04/08/23, 2:50 PM

## 2023-04-08 NOTE — Plan of Care (Signed)

## 2023-04-09 ENCOUNTER — Inpatient Hospital Stay: Payer: Medicaid Other

## 2023-04-09 DIAGNOSIS — M8608 Acute hematogenous osteomyelitis, other sites: Secondary | ICD-10-CM

## 2023-04-09 DIAGNOSIS — A4101 Sepsis due to Methicillin susceptible Staphylococcus aureus: Secondary | ICD-10-CM | POA: Diagnosis not present

## 2023-04-09 DIAGNOSIS — G061 Intraspinal abscess and granuloma: Secondary | ICD-10-CM | POA: Diagnosis not present

## 2023-04-09 MED ORDER — HYDROMORPHONE HCL 1 MG/ML IJ SOLN
1.0000 mg | Freq: Two times a day (BID) | INTRAMUSCULAR | Status: DC | PRN
  Administered 2023-04-09 – 2023-04-10 (×2): 1 mg via INTRAVENOUS
  Filled 2023-04-09 (×2): qty 1

## 2023-04-09 NOTE — Plan of Care (Signed)

## 2023-04-09 NOTE — Plan of Care (Signed)

## 2023-04-09 NOTE — Progress Notes (Signed)
Progress Note   Patient: Adam Cabrera ZOX:[id number] DOB: 19-Jul-1986 DOA: 03/23/2023     17 DOS: the patient was seen and examined on 04/09/2023   Brief hospital course: 37 y.o. male with medical history significant for hypertension, scrotal cellulitis, history of IV drug use, presents with low back pain.  MRI of the lumbar spine showed Epidural extension with epidural phlegmon/abscess within the ventral epidural space extending from L1-2 through L3-4.  Blood culture positive for Staph aureus, patient treated with cefepime and vancomycin.  Patient has been seen by ID and neurosurgery, L spine washout performed 8/19. Patient condition had improved up to 8/26, wound VAC removed.  Patient will need 6 weeks of IV antibiotics.  Per TOC, there is no placement option.  Patient currently on on continuous nafcillin infusion.     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 Status post neurosurgical procedure for L2-L3 decompression for epidural phlegmon on 8/19.  Patient on continuous nafcillin.  ID recommends 6 weeks total of IV antibiotics with nafcillin. Present on admission with tachycardia, tachypnea and epidural abscess.  Patient states that that he had a worsening back pain, involving the lower back as well as the right thigh.  This happened for the last 2 days.  Will obtain CT scan to evaluate.  Continue oral pain medicine for  now.  Thrombocytopenia (HCC) Improved.    Fresh blood passed per rectum Iron deficiency anemia Bleeding likely secondary to hemorrhoids.  Seen by GI.  Anusol suppositories.  Continue Lovenox to prevent DVT and monitor.  Last hemoglobin 10.4.  Obesity (BMI 30-39.9) BMI 31.42  Headache Improved with IV magnesium and 1 dose of Toradol.  As needed Fioricet.  Hyponatremia Sodium 1 point less than the normal range.       Subjective:  Patient states that he had a worsening lower back pain for the last 2 days, involving the right thigh.  He had a normal bowel movement yesterday.  No incontinence of urine.  Physical Exam: Vitals:   04/09/23 0048 04/09/23 0500 04/09/23 0529 04/09/23 0754  BP: 119/83  133/89 127/82  Pulse: 75  82 81  Resp: 17  18 18   Temp:   97.8 F (36.6 C) 98.2 F (36.8 C)  TempSrc:   Oral   SpO2: 96%  99% 99%  Weight:  109.2 kg    Height:       General exam: Appears calm and comfortable  Respiratory system: Clear to auscultation. Respiratory effort normal. Cardiovascular system: S1 & S2 heard, RRR. No JVD, murmurs, rubs, gallops or clicks. No pedal edema. Gastrointestinal system: Abdomen is nondistended, soft and nontender. No organomegaly or masses felt. Normal bowel sounds heard. Central nervous system: Alert and oriented. No focal neurological deficits. Extremities: Symmetric 5 x 5 power. Skin: No rashes, lesions or ulcers Psychiatry: Judgement and insight appear normal. Mood & affect appropriate.    Data Reviewed:  Lab results reviewed.  Family  Communication: None  Disposition: Status is: Inpatient Remains inpatient appropriate because: Severity of disease, IV treatment.  No discharge option.     Time spent: 35 minutes  Author: Marrion Coy, MD 04/09/2023 11:14 AM  For on call review [email or web address].

## 2023-04-09 NOTE — H&P (View-Only) (Signed)
   Neurosurgery Progress Note  History: Adam Cabrera is s/p L2-3 decompression for epidural phlegmon.  POD16: Patient reports an increase of low back pain and right posterior radiating leg pain yesterday after physical therapy which has persisted today.  He has had worsening right leg weakness over the last 24 hours. POD4: Improved pain this morning POD3: NAEO POD2: continued back pain. Improved with some medication changes.  POD1: improved leg pain and strength overnight.  Physical Exam: Vitals:   04/09/23 0529 04/09/23 0754  BP: 133/89 127/82  Pulse: 82 81  Resp: 18 18  Temp: 97.8 F (36.6 C) 98.2 F (36.8 C)  SpO2: 99% 99%    AA Ox3  CNI  Strength:4/5 throughout LLE 3/5 right HF and KE 4/5 distally.  Incision c/d/I and healing well   Data:  Other tests/results:  Cultures positive for abundant Staph aureus.  Sensitivities pending. Gram stain showing gram positive cocci  Assessment/Plan:  Adam Cabrera is a 37 year old presenting with an L2-3 abscess and right greater than left lower extremity weakness status post L2-3 decompression for evacuation of epidural phlegmon.  Unfortunately has had a worsening of symptoms and increased pain overnight prompting a CT lumbar spine showing worsening collapse at L2-3 and L3-4.  - pain control - DVT prophylaxis -Removed Hemovac on 8/22. Wound vac removed 8/24 - Given CT scan results, will follow-up with lumbar MRI to evaluate for compressive pathology and lumbar flex ex for dynamic changes.  Will discuss further plan of care with Dr. Katrinka Blazing once imaging has resulted.  Manning Charity PA-C Department of Neurosurgery

## 2023-04-09 NOTE — Progress Notes (Signed)
Physical Therapy Treatment Patient Details Name: Adam Cabrera MRN: [id number] DOB: 1985-11-13 Today's Date: 04/09/2023   History of Present Illness 37 y.o. male with medical history significant for hypertension, scrotal cellulitis, history of IV drug use, presents with low back pain.  MRI of the lumbar spine showed Epidural extension with epidural phlegmon/abscess within the ventral epidural space extending from L1-2 through L3-4 and s/p lumbar laminectomy    PT Comments  Patient received in bed, he reports he stood yesterday and felt something in his back and since then has been in a lot more pain. Patient is willing to participate in PT. He is mod I with rolling and side lying to sit, however requires heavy reliance on bed controls and rails.  Patient was preparing to stand and transport came to take patient to test. Patient required mod A to bring LEs back up onto bed also with heavy use of rails and head of bed elevated. He will continue to benefit from skilled PT to improve functional independence.      If plan is discharge home, recommend the following: A little help with walking and/or transfers;A little help with bathing/dressing/bathroom;Help with stairs or ramp for entrance;Assist for transportation;Assistance with cooking/housework   Can travel by private Scientist, research (medical) walker (2 wheels)    Recommendations for Other Services       Precautions / Restrictions Precautions Precautions: Back;Fall Required Braces or Orthoses: Spinal Brace Spinal Brace: Thoracolumbosacral orthotic Spinal Brace Comments: for comfort only- pt declined on this date Restrictions Weight Bearing Restrictions: No     Mobility  Bed Mobility Overal bed mobility: Needs Assistance Bed Mobility: Rolling, Sidelying to Sit, Sit to Sidelying Rolling: Supervision, Used rails Sidelying to sit: Supervision, HOB elevated, Used rails Supine to sit: Min assist, Used rails, HOB  elevated     General bed mobility comments: Assist needed to bring B LEs back up onto bed.    Transfers                   General transfer comment: treatment interrupted by transport taking patient for testing prior to standing edge of bed.    Ambulation/Gait                   Stairs             Wheelchair Mobility     Tilt Bed    Modified Rankin (Stroke Patients Only)       Balance Overall balance assessment: Modified Independent Sitting-balance support: Feet supported Sitting balance-Leahy Scale: Normal Sitting balance - Comments: Able to maintain seated EOB and in recliner balance with heavy reliance on BUE to reduce back discomfort                                    Cognition Arousal: Alert Behavior During Therapy: WFL for tasks assessed/performed Overall Cognitive Status: Within Functional Limits for tasks assessed                                 General Comments: Agreeable to session but limited by pain        Exercises      General Comments        Pertinent Vitals/Pain Pain Assessment Pain Assessment: 0-10 Pain Score: 8  Faces Pain Scale:  Hurts whole lot Pain Descriptors / Indicators: Grimacing, Guarding, Discomfort, Sore Pain Intervention(s): Monitored during session, Repositioned    Home Living                          Prior Function            PT Goals (current goals can now be found in the care plan section) Acute Rehab PT Goals Patient Stated Goal: less pain when moving PT Goal Formulation: With patient Time For Goal Achievement: 04/23/23 Potential to Achieve Goals: Good Progress towards PT goals: Progressing toward goals    Frequency    Min 1X/week      PT Plan      Co-evaluation              AM-PAC PT "6 Clicks" Mobility   Outcome Measure  Help needed turning from your back to your side while in a flat bed without using bedrails?: A Lot Help needed  moving from lying on your back to sitting on the side of a flat bed without using bedrails?: A Lot Help needed moving to and from a bed to a chair (including a wheelchair)?: A Lot Help needed standing up from a chair using your arms (e.g., wheelchair or bedside chair)?: A Lot Help needed to walk in hospital room?: A Little Help needed climbing 3-5 steps with a railing? : A Lot 6 Click Score: 13    End of Session Equipment Utilized During Treatment: Gait belt Activity Tolerance: Patient limited by pain Patient left: in bed;Other (comment) (transport to take patient for test) Nurse Communication: Mobility status PT Visit Diagnosis: Pain;Other abnormalities of gait and mobility (R26.89) Pain - Right/Left: Right Pain - part of body: Leg (back)     Time: 1115-1130 PT Time Calculation (min) (ACUTE ONLY): 15 min  Charges:    $Therapeutic Activity: 8-22 mins PT General Charges $$ ACUTE PT VISIT: 1 Visit                     Tonea Leiphart, PT, GCS 04/09/23,12:35 PM

## 2023-04-09 NOTE — Progress Notes (Signed)
   Neurosurgery Progress Note  History: Adam Cabrera is s/p L2-3 decompression for epidural phlegmon.  POD16: Patient reports an increase of low back pain and right posterior radiating leg pain yesterday after physical therapy which has persisted today.  He has had worsening right leg weakness over the last 24 hours. POD4: Improved pain this morning POD3: NAEO POD2: continued back pain. Improved with some medication changes.  POD1: improved leg pain and strength overnight.  Physical Exam: Vitals:   04/09/23 0529 04/09/23 0754  BP: 133/89 127/82  Pulse: 82 81  Resp: 18 18  Temp: 97.8 F (36.6 C) 98.2 F (36.8 C)  SpO2: 99% 99%    AA Ox3  CNI  Strength:4/5 throughout LLE 3/5 right HF and KE 4/5 distally.  Incision c/d/I and healing well   Data:  Other tests/results:  Cultures positive for abundant Staph aureus.  Sensitivities pending. Gram stain showing gram positive cocci  Assessment/Plan:  Adam Cabrera is a 37 year old presenting with an L2-3 abscess and right greater than left lower extremity weakness status post L2-3 decompression for evacuation of epidural phlegmon.  Unfortunately has had a worsening of symptoms and increased pain overnight prompting a CT lumbar spine showing worsening collapse at L2-3 and L3-4.  - pain control - DVT prophylaxis -Removed Hemovac on 8/22. Wound vac removed 8/24 - Given CT scan results, will follow-up with lumbar MRI to evaluate for compressive pathology and lumbar flex ex for dynamic changes.  Will discuss further plan of care with Dr. Katrinka Blazing once imaging has resulted.  Manning Charity PA-C Department of Neurosurgery

## 2023-04-10 ENCOUNTER — Other Ambulatory Visit: Payer: Self-pay

## 2023-04-10 ENCOUNTER — Inpatient Hospital Stay: Payer: Medicaid Other

## 2023-04-10 ENCOUNTER — Encounter
Admission: EM | Disposition: A | Discharge status: Home / Self Care | Payer: Self-pay | Source: Home / Self Care | Attending: Internal Medicine

## 2023-04-10 ENCOUNTER — Encounter: Payer: Self-pay | Admitting: Internal Medicine

## 2023-04-10 DIAGNOSIS — M4646 Discitis, unspecified, lumbar region: Secondary | ICD-10-CM | POA: Diagnosis not present

## 2023-04-10 DIAGNOSIS — M4856XA Collapsed vertebra, not elsewhere classified, lumbar region, initial encounter for fracture: Secondary | ICD-10-CM

## 2023-04-10 DIAGNOSIS — M8608 Acute hematogenous osteomyelitis, other sites: Secondary | ICD-10-CM | POA: Diagnosis not present

## 2023-04-10 DIAGNOSIS — A4101 Sepsis due to Methicillin susceptible Staphylococcus aureus: Secondary | ICD-10-CM | POA: Diagnosis not present

## 2023-04-10 DIAGNOSIS — R29898 Other symptoms and signs involving the musculoskeletal system: Secondary | ICD-10-CM

## 2023-04-10 DIAGNOSIS — M4626 Osteomyelitis of vertebra, lumbar region: Secondary | ICD-10-CM | POA: Diagnosis not present

## 2023-04-10 DIAGNOSIS — G061 Intraspinal abscess and granuloma: Secondary | ICD-10-CM | POA: Diagnosis not present

## 2023-04-10 HISTORY — PX: POSTERIOR LUMBAR FUSION 4 LEVEL: SHX6037

## 2023-04-10 HISTORY — PX: APPLICATION OF INTRAOPERATIVE CT SCAN: SHX6668

## 2023-04-10 SURGERY — POSTERIOR LUMBAR FUSION 4 LEVEL
Anesthesia: General

## 2023-04-10 MED ORDER — ACETAMINOPHEN 10 MG/ML IV SOLN
INTRAVENOUS | Status: DC | PRN
  Administered 2023-04-10: 1000 mg via INTRAVENOUS

## 2023-04-10 MED ORDER — DIPHENHYDRAMINE HCL 50 MG/ML IJ SOLN
INTRAMUSCULAR | Status: AC
  Filled 2023-04-10: qty 1

## 2023-04-10 MED ORDER — FENTANYL CITRATE (PF) 100 MCG/2ML IJ SOLN
INTRAMUSCULAR | Status: AC
  Filled 2023-04-10: qty 2

## 2023-04-10 MED ORDER — SODIUM CHLORIDE 0.9 % IV SOLN: 250.0000 mL | INTRAVENOUS | Status: DC

## 2023-04-10 MED ORDER — BUPIVACAINE LIPOSOME 1.3 % IJ SUSP
INTRAMUSCULAR | Status: AC
  Filled 2023-04-10: qty 20

## 2023-04-10 MED ORDER — HYDROMORPHONE HCL 1 MG/ML IJ SOLN
INTRAMUSCULAR | Status: AC
  Filled 2023-04-10: qty 1

## 2023-04-10 MED ORDER — ONDANSETRON HCL 4 MG/2ML IJ SOLN
INTRAMUSCULAR | Status: AC
  Filled 2023-04-10: qty 2

## 2023-04-10 MED ORDER — POLYETHYLENE GLYCOL 3350 17 G PO PACK: 17.0000 g | PACK | Freq: Every day | ORAL | Status: DC | PRN

## 2023-04-10 MED ORDER — DIPHENHYDRAMINE HCL 12.5 MG/5ML PO ELIX: 12.5000 mg | ORAL_SOLUTION | Freq: Four times a day (QID) | ORAL | Status: DC | PRN

## 2023-04-10 MED ORDER — DIPHENHYDRAMINE HCL 50 MG/ML IJ SOLN
12.5000 mg | Freq: Four times a day (QID) | INTRAMUSCULAR | Status: DC | PRN
  Administered 2023-04-10: 12.5 mg via INTRAVENOUS

## 2023-04-10 MED ORDER — SURGIFLO WITH THROMBIN (HEMOSTATIC MATRIX KIT) OPTIME
TOPICAL | Status: DC | PRN
  Administered 2023-04-10: 1 via TOPICAL
  Administered 2023-04-10: 2 via TOPICAL

## 2023-04-10 MED ORDER — GABAPENTIN 300 MG PO CAPS
600.0000 mg | ORAL_CAPSULE | Freq: Once | ORAL | Status: AC
  Administered 2023-04-10: 600 mg via ORAL

## 2023-04-10 MED ORDER — TETRACAINE HCL 0.5 % OP SOLN
1.0000 [drp] | Freq: Once | OPHTHALMIC | Status: AC
  Administered 2023-04-10: 1 [drp] via OPHTHALMIC
  Filled 2023-04-10: qty 4

## 2023-04-10 MED ORDER — SODIUM CHLORIDE 0.9% FLUSH: 9.0000 mL | INTRAVENOUS | Status: DC | PRN

## 2023-04-10 MED ORDER — ROCURONIUM BROMIDE 100 MG/10ML IV SOLN
INTRAVENOUS | Status: DC | PRN
  Administered 2023-04-10 (×2): 50 mg via INTRAVENOUS
  Administered 2023-04-10 (×2): 20 mg via INTRAVENOUS

## 2023-04-10 MED ORDER — BUPIVACAINE-EPINEPHRINE (PF) 0.5% -1:200000 IJ SOLN: INTRAMUSCULAR | Status: DC | PRN

## 2023-04-10 MED ORDER — SODIUM CHLORIDE 0.9% FLUSH
3.0000 mL | Freq: Two times a day (BID) | INTRAVENOUS | Status: DC
  Administered 2023-04-10 – 2023-04-24 (×24): 3 mL via INTRAVENOUS

## 2023-04-10 MED ORDER — SODIUM CHLORIDE 0.9% FLUSH: 3.0000 mL | INTRAVENOUS | Status: DC | PRN

## 2023-04-10 MED ORDER — METHYLPREDNISOLONE ACETATE 40 MG/ML IJ SUSP
INTRAMUSCULAR | Status: AC
  Filled 2023-04-10: qty 1

## 2023-04-10 MED ORDER — ONDANSETRON HCL 4 MG/2ML IJ SOLN: 4.0000 mg | Freq: Four times a day (QID) | INTRAMUSCULAR | Status: DC | PRN

## 2023-04-10 MED ORDER — HYDROCORTISONE ACETATE 25 MG RE SUPP: 25.0000 mg | Freq: Three times a day (TID) | RECTAL | Status: DC | PRN

## 2023-04-10 MED ORDER — FENTANYL CITRATE (PF) 100 MCG/2ML IJ SOLN
INTRAMUSCULAR | Status: DC | PRN
  Administered 2023-04-10: 100 ug via INTRAVENOUS

## 2023-04-10 MED ORDER — POLYMYXIN B-TRIMETHOPRIM 10000-0.1 UNIT/ML-% OP SOLN
1.0000 [drp] | Freq: Four times a day (QID) | OPHTHALMIC | Status: AC
  Administered 2023-04-10 – 2023-04-11 (×4): 1 [drp] via OPHTHALMIC
  Filled 2023-04-10: qty 10

## 2023-04-10 MED ORDER — MAGNESIUM CITRATE PO SOLN: 1.0000 | Freq: Once | ORAL | Status: DC | PRN

## 2023-04-10 MED ORDER — DEXMEDETOMIDINE HCL IN NACL 80 MCG/20ML IV SOLN
INTRAVENOUS | Status: DC | PRN
  Administered 2023-04-10: 4 ug via INTRAVENOUS
  Administered 2023-04-10 (×3): 8 ug via INTRAVENOUS

## 2023-04-10 MED ORDER — HYDROMORPHONE 1 MG/ML IV SOLN
INTRAVENOUS | Status: DC
  Administered 2023-04-10 – 2023-04-11 (×2): 30 mg via INTRAVENOUS
  Filled 2023-04-10 (×3): qty 30

## 2023-04-10 MED ORDER — DEXAMETHASONE SODIUM PHOSPHATE 10 MG/ML IJ SOLN
INTRAMUSCULAR | Status: AC
  Filled 2023-04-10: qty 1

## 2023-04-10 MED ORDER — BSS IO SOLN
15.0000 mL | Freq: Once | INTRAOCULAR | Status: AC
  Administered 2023-04-10: 15 mL
  Filled 2023-04-10: qty 15

## 2023-04-10 MED ORDER — KETOROLAC TROMETHAMINE 0.5 % OP SOLN
1.0000 [drp] | Freq: Four times a day (QID) | OPHTHALMIC | Status: AC
  Administered 2023-04-10 – 2023-04-11 (×4): 1 [drp] via OPHTHALMIC
  Filled 2023-04-10: qty 3

## 2023-04-10 MED ORDER — MIDAZOLAM HCL 2 MG/2ML IJ SOLN
INTRAMUSCULAR | Status: AC
  Filled 2023-04-10: qty 2

## 2023-04-10 MED ORDER — LACTATED RINGERS IV SOLN: INTRAVENOUS | Status: DC | PRN

## 2023-04-10 MED ORDER — KETAMINE HCL 50 MG/5ML IJ SOSY
PREFILLED_SYRINGE | INTRAMUSCULAR | Status: AC
  Filled 2023-04-10: qty 5

## 2023-04-10 MED ORDER — SODIUM CHLORIDE 0.9 % IV SOLN: INTRAVENOUS | Status: DC

## 2023-04-10 MED ORDER — METHOCARBAMOL 500 MG PO TABS
ORAL_TABLET | ORAL | Status: AC
  Filled 2023-04-10: qty 2

## 2023-04-10 MED ORDER — BISACODYL 10 MG RE SUPP: 10.0000 mg | Freq: Every day | RECTAL | Status: DC | PRN

## 2023-04-10 MED ORDER — ONDANSETRON HCL 4 MG/2ML IJ SOLN
INTRAMUSCULAR | Status: DC | PRN
  Administered 2023-04-10 (×2): 4 mg via INTRAVENOUS

## 2023-04-10 MED ORDER — DOCUSATE SODIUM 100 MG PO CAPS
100.0000 mg | ORAL_CAPSULE | Freq: Two times a day (BID) | ORAL | Status: DC
  Administered 2023-04-10 – 2023-04-21 (×18): 100 mg via ORAL
  Filled 2023-04-10 (×22): qty 1

## 2023-04-10 MED ORDER — NALOXONE HCL 0.4 MG/ML IJ SOLN: 0.4000 mg | INTRAMUSCULAR | Status: DC | PRN

## 2023-04-10 MED ORDER — ROCURONIUM BROMIDE 10 MG/ML (PF) SYRINGE
PREFILLED_SYRINGE | INTRAVENOUS | Status: AC
  Filled 2023-04-10: qty 10

## 2023-04-10 MED ORDER — BUPIVACAINE HCL (PF) 0.5 % IJ SOLN
INTRAMUSCULAR | Status: AC
  Filled 2023-04-10: qty 30

## 2023-04-10 MED ORDER — VANCOMYCIN HCL 1000 MG IV SOLR
INTRAVENOUS | Status: AC
  Filled 2023-04-10: qty 40

## 2023-04-10 MED ORDER — SODIUM CHLORIDE (PF) 0.9 % IJ SOLN
INTRAMUSCULAR | Status: AC
  Filled 2023-04-10: qty 20

## 2023-04-10 MED ORDER — TRANEXAMIC ACID-NACL 1000-0.7 MG/100ML-% IV SOLN
INTRAVENOUS | Status: AC
  Filled 2023-04-10: qty 100

## 2023-04-10 MED ORDER — HYDROMORPHONE HCL 1 MG/ML IJ SOLN
INTRAMUSCULAR | Status: DC | PRN
  Administered 2023-04-10 (×2): .5 mg via INTRAVENOUS

## 2023-04-10 MED ORDER — VANCOMYCIN HCL 1000 MG IV SOLR
INTRAVENOUS | Status: DC | PRN
Start: 2023-04-10 — End: 2023-04-10
  Administered 2023-04-10 (×2): 1000 mg via TOPICAL

## 2023-04-10 MED ORDER — SUGAMMADEX SODIUM 200 MG/2ML IV SOLN
INTRAVENOUS | Status: DC | PRN
  Administered 2023-04-10: 200 mg via INTRAVENOUS

## 2023-04-10 MED ORDER — ONDANSETRON HCL 4 MG PO TABS: 4.0000 mg | ORAL_TABLET | Freq: Four times a day (QID) | ORAL | Status: DC | PRN

## 2023-04-10 MED ORDER — IRRISEPT - 450ML BOTTLE WITH 0.05% CHG IN STERILE WATER, USP 99.95% OPTIME
TOPICAL | Status: DC | PRN
  Administered 2023-04-10: 450 mL

## 2023-04-10 MED ORDER — KETOROLAC TROMETHAMINE 15 MG/ML IJ SOLN
15.0000 mg | Freq: Four times a day (QID) | INTRAMUSCULAR | Status: AC
  Administered 2023-04-11 (×3): 15 mg via INTRAVENOUS
  Filled 2023-04-10 (×3): qty 1

## 2023-04-10 MED ORDER — 0.9 % SODIUM CHLORIDE (POUR BTL) OPTIME
TOPICAL | Status: DC | PRN
  Administered 2023-04-10: 500 mL

## 2023-04-10 MED ORDER — DEXAMETHASONE SODIUM PHOSPHATE 10 MG/ML IJ SOLN
INTRAMUSCULAR | Status: DC | PRN
  Administered 2023-04-10: 10 mg via INTRAVENOUS

## 2023-04-10 MED ORDER — PROPOFOL 10 MG/ML IV BOLUS
INTRAVENOUS | Status: DC | PRN
  Administered 2023-04-10: 200 mg via INTRAVENOUS

## 2023-04-10 MED ORDER — KETAMINE HCL 10 MG/ML IJ SOLN
INTRAMUSCULAR | Status: DC | PRN
Start: 2023-04-10 — End: 2023-04-10
  Administered 2023-04-10: 30 mg via INTRAVENOUS
  Administered 2023-04-10 (×2): 10 mg via INTRAVENOUS

## 2023-04-10 MED ORDER — HYDROMORPHONE HCL 1 MG/ML IJ SOLN
0.2500 mg | INTRAMUSCULAR | Status: DC | PRN
  Administered 2023-04-10 (×4): 0.5 mg via INTRAVENOUS

## 2023-04-10 MED ORDER — LIDOCAINE HCL (CARDIAC) PF 100 MG/5ML IV SOSY
PREFILLED_SYRINGE | INTRAVENOUS | Status: DC | PRN
  Administered 2023-04-10: 100 mg via INTRAVENOUS

## 2023-04-10 MED ORDER — ACETAMINOPHEN 10 MG/ML IV SOLN
INTRAVENOUS | Status: AC
  Filled 2023-04-10: qty 100

## 2023-04-10 MED ORDER — PHENYLEPHRINE 80 MCG/ML (10ML) SYRINGE FOR IV PUSH (FOR BLOOD PRESSURE SUPPORT)
PREFILLED_SYRINGE | INTRAVENOUS | Status: AC
  Filled 2023-04-10: qty 10

## 2023-04-10 MED ORDER — TRANEXAMIC ACID-NACL 1000-0.7 MG/100ML-% IV SOLN
INTRAVENOUS | Status: DC | PRN
Start: 2023-04-10 — End: 2023-04-10
  Administered 2023-04-10: 1000 mg via INTRAVENOUS

## 2023-04-10 MED ORDER — MIDAZOLAM HCL 2 MG/2ML IJ SOLN
INTRAMUSCULAR | Status: DC | PRN
  Administered 2023-04-10: 2 mg via INTRAVENOUS

## 2023-04-10 MED ORDER — GABAPENTIN 300 MG PO CAPS
ORAL_CAPSULE | ORAL | Status: AC
  Filled 2023-04-10: qty 2

## 2023-04-10 MED ORDER — ACETAMINOPHEN 500 MG PO TABS
1000.0000 mg | ORAL_TABLET | Freq: Four times a day (QID) | ORAL | Status: DC
  Administered 2023-04-11 – 2023-04-26 (×45): 1000 mg via ORAL
  Filled 2023-04-10 (×52): qty 2

## 2023-04-10 MED ORDER — ENOXAPARIN SODIUM 40 MG/0.4ML IJ SOSY
40.0000 mg | PREFILLED_SYRINGE | INTRAMUSCULAR | Status: DC
  Administered 2023-04-11 – 2023-05-01 (×21): 40 mg via SUBCUTANEOUS
  Filled 2023-04-10 (×21): qty 0.4

## 2023-04-10 SURGICAL SUPPLY — 53 items
ADH SKN CLS APL DERMABOND .7 (GAUZE/BANDAGES/DRESSINGS) ×1
AGENT HMST KT MTR STRL THRMB (HEMOSTASIS) ×3
ALLOGRAFT BONE FIBER KORE 10CC (Bone Implant) IMPLANT
BASIN KIT SINGLE STR (MISCELLANEOUS) ×1 IMPLANT
BONE CANC CHIPS 20CC PCAN1/4 (Bone Implant) ×1 IMPLANT
BUR NEURO DRILL SOFT 3.0X3.8M (BURR) ×1 IMPLANT
CHIPS CANC BONE 20CC PCAN1/4 (Bone Implant) ×1 IMPLANT
COVERAGE SUPP BRAINLAB NG SPNE (MISCELLANEOUS) IMPLANT
COVERAGE SUPPORT SPINE BRAINLB (MISCELLANEOUS)
DERMABOND ADVANCED .7 DNX12 (GAUZE/BANDAGES/DRESSINGS) ×1 IMPLANT
DRAPE C-ARMOR (DRAPES) IMPLANT
DRAPE LAPAROTOMY 100X77 ABD (DRAPES) ×1 IMPLANT
DRAPE SCAN PATIENT (DRAPES) ×1 IMPLANT
ELECT REM PT RETURN 9FT ADLT (ELECTROSURGICAL) ×1
ELECTRODE REM PT RTRN 9FT ADLT (ELECTROSURGICAL) ×1 IMPLANT
EX-PIN ORTHOLOCK NAV 4X150 (PIN) IMPLANT
FEE CVG SUPP BRAINLAB NG SPNE (MISCELLANEOUS) IMPLANT
GLOVE BIOGEL PI IND STRL 6.5 (GLOVE) ×1 IMPLANT
GLOVE SURG SYN 6.5 ES PF (GLOVE) ×2 IMPLANT
GLOVE SURG SYN 6.5 PF PI (GLOVE) ×2 IMPLANT
GLOVE SURG SYN 8.5 E (GLOVE) ×3 IMPLANT
GLOVE SURG SYN 8.5 PF PI (GLOVE) ×3 IMPLANT
GOWN SRG LRG LVL 4 IMPRV REINF (GOWNS) ×1 IMPLANT
GOWN SRG XL LVL 3 NONREINFORCE (GOWNS) ×1 IMPLANT
GOWN STRL NON-REIN TWL XL LVL3 (GOWNS) ×1
GOWN STRL REIN LRG LVL4 (GOWNS) ×1
GRAFT BNE CANC CHIPS 1-8 20CC (Bone Implant) IMPLANT
HOLDER FOLEY CATH W/STRAP (MISCELLANEOUS) ×1 IMPLANT
JET LAVAGE IRRISEPT WOUND (IRRIGATION / IRRIGATOR) ×1
KIT PREVENA INCISION MGT 13 (CANNISTER) ×1 IMPLANT
KIT SPINAL PRONEVIEW (KITS) ×1 IMPLANT
LAVAGE JET IRRISEPT WOUND (IRRIGATION / IRRIGATOR) ×1 IMPLANT
MANIFOLD NEPTUNE II (INSTRUMENTS) ×1 IMPLANT
MARKER SKIN DUAL TIP RULER LAB (MISCELLANEOUS) ×1 IMPLANT
MARKER SPHERE PSV REFLC 13MM (MARKER) ×7 IMPLANT
NDL SAFETY ECLIP 18X1.5 (MISCELLANEOUS) ×1 IMPLANT
NS IRRIG 1000ML POUR BTL (IV SOLUTION) ×1 IMPLANT
NS IRRIG 500ML POUR BTL (IV SOLUTION) IMPLANT
PACK LAMINECTOMY ARMC (PACKS) ×1 IMPLANT
PAD ARMBOARD 7.5X6 YLW CONV (MISCELLANEOUS) ×1 IMPLANT
ROD RELINE-O 5.5X100 LORD (Rod) IMPLANT
SCREW LOCK RELINE 5.5 TULIP (Screw) IMPLANT
SCREW RELINE-O 6.5X55 POLY (Screw) IMPLANT
SCREW RELINE-O POLY 6.5X50MM (Screw) IMPLANT
SURGIFLO W/THROMBIN 8M KIT (HEMOSTASIS) ×1 IMPLANT
SUT DVC VLOC 3-0 CL 6 P-12 (SUTURE) ×1 IMPLANT
SUT VIC AB 0 CT1 27 (SUTURE) ×2
SUT VIC AB 0 CT1 27XCR 8 STRN (SUTURE) ×2 IMPLANT
SUT VIC AB 2-0 CT1 18 (SUTURE) ×2 IMPLANT
SYR 10ML LL (SYRINGE) ×1 IMPLANT
SYR 30ML LL (SYRINGE) ×2 IMPLANT
TRAP FLUID SMOKE EVACUATOR (MISCELLANEOUS) ×1 IMPLANT
WATER STERILE IRR 1000ML POUR (IV SOLUTION) ×2 IMPLANT

## 2023-04-10 NOTE — Plan of Care (Signed)

## 2023-04-10 NOTE — Progress Notes (Signed)
Progress Note   Patient: Adam Cabrera ZYS:[id number] DOB: [birth year] DOA: 03/23/2023     18 DOS: the patient was seen and examined on 04/10/2023   Brief hospital course: 37 y.o. male with medical history significant for hypertension, scrotal cellulitis, history of IV drug use, presents with low back pain.  MRI of the lumbar spine showed Epidural extension with epidural phlegmon/abscess within the ventral epidural space extending from L1-2 through L3-4.  Blood culture positive for Staph aureus, patient treated with cefepime and vancomycin.  Patient has been seen by ID and neurosurgery, L spine washout performed 8/19. Patient condition had improved up to 8/26, wound VAC removed.  Patient will need 6 weeks of IV antibiotics.  Per TOC, there is no placement option.  Patient currently on on continuous nafcillin infusion.  Patient had a worsening back pain since 9/3, CT and MRI of the lumbar spine were repeated,Showed persistent findings of osteomyelitis discitis at L2-3 with mildly progressive vertebral body height loss and endplate erosion since previous. Persistent epidural involvement with ventral epidural phlegmon/abscess, persistent fairly severe canal and bilateral subarticular stenosis,bilateral psoas abscesses.  Patient was brought to the OR again 9/5.     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 Status post neurosurgical  procedure for L2-L3 decompression for epidural phlegmon on 8/19.  Patient on continuous nafcillin.  ID recommends 6 weeks total of IV antibiotics with nafcillin. Present on admission with tachycardia, tachypnea and epidural abscess.   Patient had a worsening back pain for the last 2 days, after MRI and CT scan, decision was made to bring patient to surgery again today for repeat washout, right L2-3 facetectomy, L1-4 posterior spinal fusion, L2-3 transforaminal arthrodesis with bone graft, and posterior Lateral arthrodesis.    Thrombocytopenia (HCC) Improved.     Fresh blood passed per rectum Iron deficiency anemia Bleeding likely secondary to hemorrhoids.  Seen by GI.  Anusol suppositories.  Continue Lovenox to prevent DVT and monitor.  Hemoglobin has been stable.   Obesity (BMI 30-39.9) BMI 31.42   Headache Improved.   Hyponatremia Stable.      Subjective:  Patient still has significant back pain, worse with movement.  Physical Exam: Vitals:   04/09/23 2213 04/10/23 0500 04/10/23 0759 04/10/23 1315  BP: 120/87  116/77 127/79  Pulse: 97  81 79  Resp: 20  18 20   Temp: 98.3 F (36.8 C)  98.4 F (36.9 C) 98.3 F (36.8 C)  TempSrc:   Oral Oral  SpO2: 100%  100% 100%  Weight:  106 kg  106 kg  Height:    6\' 2"  (1.88 m)   General exam: Appears calm and comfortable  Respiratory system: Clear to auscultation. Respiratory effort normal. Cardiovascular system: S1 & S2 heard, RRR. No JVD, murmurs, rubs, gallops or clicks. No pedal edema. Gastrointestinal system: Abdomen is nondistended, soft and nontender. No organomegaly or masses felt. Normal bowel sounds heard. Central nervous system: Alert  and oriented. No focal neurological deficits. Extremities: Symmetric 5 x 5 power. Skin: No rashes, lesions or ulcers Psychiatry: Judgement and insight appear normal. Mood & affect appropriate.    Data Reviewed:  Lab results reviewed.  Family Communication:  None  Disposition: Status is: Inpatient Remains inpatient appropriate because: Severity of disease, IV treatment, inpatient procedure     Time spent: 35 minutes  Author: Marrion Coy, MD 04/10/2023 1:40 PM  For on call review [email or web address].

## 2023-04-10 NOTE — Progress Notes (Signed)
OT Cancellation Note  Patient Details Name: Adam Cabrera MRN: [id number] DOB: 11/10/1985   Cancelled Treatment:    Reason Eval/Treat Not Completed: Other (comment)  per chart, pt is scheduled for procedure this afternoon. OT will hold at this time, will need new orders as appropriate after procedure   Oleta Mouse, OTD OTR/L  04/10/23, 11:22 AM

## 2023-04-10 NOTE — Anesthesia Postprocedure Evaluation (Signed)
Anesthesia Post Note  Patient: Adam Cabrera  Procedure(s) Performed: L1-L4 Posterior Spinal Fusion, Repeat washout, Right sided L2-3 facectomy, transforaminal arthrodesis L2-3 with bone graft and posterior lateral arthrodesis APPLICATION OF INTRAOPERATIVE CT SCAN  Patient location during evaluation: PACU Anesthesia Type: General Level of consciousness: awake and alert Pain management: pain level controlled Vital Signs Assessment: post-procedure vital signs reviewed and stable Respiratory status: spontaneous breathing, nonlabored ventilation and respiratory function stable Cardiovascular status: blood pressure returned to baseline and stable Postop Assessment: no apparent nausea or vomiting Anesthetic complications: yes Comments: Left corneal abrasion   No notable events documented.   Last Vitals:  Vitals:   04/10/23 1945 04/10/23 2016  BP: (!) 136/94 120/88  Pulse: (!) 107 (!) 109  Resp: 13 18  Temp:  36.7 C  SpO2: 95% 96%    Last Pain:  Vitals:   04/10/23 1945  TempSrc:   PainSc: 6                  Foye Deer

## 2023-04-10 NOTE — Transfer of Care (Signed)
Immediate Anesthesia Transfer of Care Note  Patient: Adam Cabrera  Procedure(s) Performed: L1-L4 Posterior Spinal Fusion, Repeat washout, Right sided L2-3 facectomy, transforaminal arthrodesis L2-3 with bone graft and posterior lateral arthrodesis APPLICATION OF INTRAOPERATIVE CT SCAN  Patient Location: PACU  Anesthesia Type:General  Level of Consciousness: awake, alert , and drowsy  Airway & Oxygen Therapy: Patient Spontanous Breathing and Patient connected to face mask oxygen  Post-op Assessment: Report given to RN and Post -op Vital signs reviewed and stable  Post vital signs: Reviewed and stable  Last Vitals:  Vitals Value Taken Time  BP 102/76 04/10/23 1830  Temp 37.3 C 04/10/23 1828  Pulse 103 04/10/23 1831  Resp 20 04/10/23 1831  SpO2 99 % 04/10/23 1831  Vitals shown include unfiled device data.  Last Pain:  Vitals:   04/10/23 1315  TempSrc: Oral  PainSc: 8       Patients Stated Pain Goal: 2 (04/10/23 1315)  Complications: No notable events documented.

## 2023-04-10 NOTE — Interval H&P Note (Signed)
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  The various methods of treatment have been discussed with the patient and family. After consideration of risks, benefits and other options for treatment, the patient has consented to  Procedure(s) with comments: L1-L4 Posterior Spinal Fusion (N/A) - 3D Carm, Brain Lab, Globus APPLICATION OF INTRAOPERATIVE CT SCAN (N/A) as a surgical intervention.  The patient's history has been reviewed, patient examined, no change in status, stable for surgery.  I have reviewed the patient's chart and labs.  Questions were answered to the patient's satisfaction.     Adam Cabrera

## 2023-04-10 NOTE — Anesthesia Preprocedure Evaluation (Signed)
Anesthesia Evaluation  Patient identified by MRN, date of birth, ID band Patient awake    Reviewed: Allergy & Precautions, H&P , NPO status , Patient's Chart, lab work & pertinent test results  History of Anesthesia Complications Negative for: history of anesthetic complications  Airway Mallampati: II  TM Distance: >3 FB Neck ROM: full    Dental  (+) Missing, Poor Dentition, Chipped   Pulmonary neg pulmonary ROS, Current Smoker and Patient abstained from smoking.   Pulmonary exam normal        Cardiovascular hypertension, Normal cardiovascular exam Rhythm:Regular Rate:Tachycardia     Neuro/Psych negative neurological ROS  negative psych ROS   GI/Hepatic negative GI ROS, Neg liver ROS,,,  Endo/Other  negative endocrine ROS    Renal/GU      Musculoskeletal   Abdominal  (+) + obese  Peds  Hematology negative hematology ROS (+) Blood dyscrasia, anemia   Anesthesia Other Findings Past Medical History: No date: Cellulitis  Past Surgical History: 03/24/2023: LUMBAR LAMINECTOMY FOR EPIDURAL ABSCESS; N/A     Comment:  Procedure: LUMBAR LAMINECTOMY L2-3 FOR ABSCESS;                Surgeon: Lovenia Kim, MD;  Location: [facility] ORS;                Service: Neurosurgery;  Laterality: N/A;  BMI    Body Mass Index: 30.00 kg/m      Reproductive/Obstetrics negative OB ROS                             Anesthesia Physical Anesthesia Plan  ASA: 3  Anesthesia Plan: General ETT   Post-op Pain Management: Ofirmev IV (intra-op)*, Toradol IV (intra-op)* and Ketamine IV*   Induction: Intravenous  PONV Risk Score and Plan: 2 and Ondansetron, Dexamethasone and Midazolam  Airway Management Planned: Oral ETT  Additional Equipment:   Intra-op Plan:   Post-operative Plan: Extubation in OR  Informed Consent: I have reviewed the patients History and Physical, chart, labs and discussed the procedure  including the risks, benefits and alternatives for the proposed anesthesia with the patient or authorized representative who has indicated his/her understanding and acceptance.     Dental Advisory Given  Plan Discussed with: Anesthesiologist, CRNA and Surgeon  Anesthesia Plan Comments: (Patient consented for risks of anesthesia including but not limited to:  - adverse reactions to medications - damage to eyes, teeth, lips or other oral mucosa - nerve damage due to positioning  - sore throat or hoarseness - Damage to heart, brain, nerves, lungs, other parts of body or loss of life  Patient voiced understanding.)        Anesthesia Quick Evaluation

## 2023-04-10 NOTE — Anesthesia Procedure Notes (Signed)
Procedure Name: Intubation Date/Time: 04/10/2023 2:34 PM  Performed by: Cheral Bay, CRNAPre-anesthesia Checklist: Patient identified, Emergency Drugs available, Suction available and Patient being monitored Patient Re-evaluated:Patient Re-evaluated prior to induction Oxygen Delivery Method: Circle system utilized Preoxygenation: Pre-oxygenation with 100% oxygen Induction Type: IV induction Ventilation: Mask ventilation without difficulty Laryngoscope Size: 4 and McGraph Grade View: Grade I Tube type: Oral Tube size: 7.5 mm Number of attempts: 1 Airway Equipment and Method: Stylet Placement Confirmation: ETT inserted through vocal cords under direct vision, positive ETCO2 and breath sounds checked- equal and bilateral Secured at: 23 cm Tube secured with: Tape Dental Injury: Teeth and Oropharynx as per pre-operative assessment  Comments: Inserted by Hassel Neth

## 2023-04-10 NOTE — Progress Notes (Signed)
PT Cancellation Note  Patient Details Name: THEOREN SCHRAMM MRN: [id number] DOB: May 16, 1986   Cancelled Treatment:    Reason Eval/Treat Not Completed: Medical issues which prohibited therapy Patient returning to OR today for additional procedures. Will re-evaluate when medically appropriate.   Dinara Lupu 04/10/2023, 11:56 AM

## 2023-04-10 NOTE — Progress Notes (Signed)
   Neurosurgery Progress Note  History: Adam Cabrera is s/p L2-3 decompression for epidural phlegmon.  POD17: Continue back and R>L posterior thigh pain.  POD16: Patient reports an increase of low back pain and right posterior radiating leg pain yesterday after physical therapy which has persisted today.  He has had worsening right leg weakness over the last 24 hours. POD4: Improved pain this morning POD3: NAEO POD2: continued back pain. Improved with some medication changes.  POD1: improved leg pain and strength overnight.  Physical Exam: Vitals:   04/09/23 2213 04/10/23 0759  BP: 120/87 116/77  Pulse: 97 81  Resp: 20 18  Temp: 98.3 F (36.8 C) 98.4 F (36.9 C)  SpO2: 100% 100%    AA Ox3  CNI  Strength:4/5 throughout LLE 3/5 right HF and KE 4/5 distally.   Data:  Other tests/results:  Cultures positive for abundant Staph aureus.  Sensitivities pending. Gram stain showing gram positive cocci  MRI L spine 04/09/23  IMPRESSION: 1. Persistent findings of osteomyelitis discitis at L2-3 with mildly progressive vertebral body height loss and endplate erosion since previous. Persistent epidural involvement with ventral epidural phlegmon/abscess as above. 2. Sequelae of interval decompressive laminectomy at L2-3. Persistent fairly severe canal and bilateral subarticular stenosis at this level, although overall appears somewhat improved as compared to preoperative exam from 03/23/2023. 3. Associated paraspinous inflammatory changes with bilateral psoas abscesses as above. 4. No other new or progressive findings elsewhere within the lumbar spine.     Electronically Signed   By: Rise Mu M.D.   On: 04/10/2023 03:07  Assessment/Plan:  Adam Cabrera is a 37 year old presenting with an L2-3 abscess and right greater than left lower extremity weakness status post L2-3 decompression for evacuation of epidural phlegmon.  Unfortunately has had a worsening of symptoms  and increased pain overnight prompting a CT lumbar spine showing worsening collapse at L2-3 and L3-4.  Risks and benefits of surgical intervention were discussed with the patient at bedside and he wishes to proceed.  Will plan to take him to the operating room with Dr. Katrinka Blazing this afternoon for repeat washout, right L2-3 facetectomy, L1-4 posterior spinal fusion, L2-3 transforaminal arthrodesis with bone graft, and posterior Lateral arthrodesis.   - pain control - DVT prophylaxis; hold Lovenox until 9/6 - OR this afternoon   Manning Charity PA-C Department of Neurosurgery

## 2023-04-11 ENCOUNTER — Encounter: Payer: Self-pay | Admitting: Neurosurgery

## 2023-04-11 DIAGNOSIS — G061 Intraspinal abscess and granuloma: Secondary | ICD-10-CM | POA: Diagnosis not present

## 2023-04-11 DIAGNOSIS — M8608 Acute hematogenous osteomyelitis, other sites: Secondary | ICD-10-CM | POA: Diagnosis not present

## 2023-04-11 DIAGNOSIS — A4101 Sepsis due to Methicillin susceptible Staphylococcus aureus: Secondary | ICD-10-CM | POA: Diagnosis not present

## 2023-04-11 LAB — CBC WITH DIFFERENTIAL/PLATELET
Abs Immature Granulocytes: 0.1 10*3/uL — ABNORMAL HIGH (ref 0.00–0.07)
Basophils Absolute: 0 10*3/uL (ref 0.0–0.1)
Basophils Relative: 0 %
Eosinophils Absolute: 0 10*3/uL (ref 0.0–0.5)
Eosinophils Relative: 0 %
HCT: 30 % — ABNORMAL LOW (ref 39.0–52.0)
Hemoglobin: 9.8 g/dL — ABNORMAL LOW (ref 13.0–17.0)
Immature Granulocytes: 1 %
Lymphocytes Relative: 21 %
Lymphs Abs: 1.9 10*3/uL (ref 0.7–4.0)
MCH: 28 pg (ref 26.0–34.0)
MCHC: 32.7 g/dL (ref 30.0–36.0)
MCV: 85.7 fL (ref 80.0–100.0)
Monocytes Absolute: 0.6 10*3/uL (ref 0.1–1.0)
Monocytes Relative: 7 %
Neutro Abs: 6.7 10*3/uL (ref 1.7–7.7)
Neutrophils Relative %: 71 %
Platelets: 189 10*3/uL (ref 150–400)
RBC: 3.5 MIL/uL — ABNORMAL LOW (ref 4.22–5.81)
RDW: 19.3 % — ABNORMAL HIGH (ref 11.5–15.5)
WBC: 9.4 10*3/uL (ref 4.0–10.5)
nRBC: 0 % (ref 0.0–0.2)

## 2023-04-11 LAB — COMPREHENSIVE METABOLIC PANEL
ALT: 13 U/L (ref 0–44)
AST: 21 U/L (ref 15–41)
Albumin: 3.5 g/dL (ref 3.5–5.0)
Alkaline Phosphatase: 51 U/L (ref 38–126)
Anion gap: 8 (ref 5–15)
BUN: 15 mg/dL (ref 6–20)
CO2: 26 mmol/L (ref 22–32)
Calcium: 8.7 mg/dL — ABNORMAL LOW (ref 8.9–10.3)
Chloride: 102 mmol/L (ref 98–111)
Creatinine, Ser: 0.68 mg/dL (ref 0.61–1.24)
GFR, Estimated: >60 mL/min (ref >60)
Glucose, Bld: 101 mg/dL — ABNORMAL HIGH (ref 70–99)
Potassium: 4.2 mmol/L (ref 3.5–5.1)
Sodium: 136 mmol/L (ref 135–145)
Total Bilirubin: 1.4 mg/dL — ABNORMAL HIGH (ref 0.3–1.2)
Total Protein: 6.9 g/dL (ref 6.5–8.1)

## 2023-04-11 LAB — MAGNESIUM: Magnesium: 2 mg/dL (ref 1.7–2.4)

## 2023-04-11 MED ORDER — OXYCODONE HCL 5 MG PO TABS
10.0000 mg | ORAL_TABLET | ORAL | Status: DC | PRN
  Administered 2023-04-11 – 2023-04-20 (×12): 10 mg via ORAL
  Filled 2023-04-11 (×11): qty 2

## 2023-04-11 MED ORDER — HYDROMORPHONE HCL 1 MG/ML IJ SOLN
1.0000 mg | INTRAMUSCULAR | Status: DC | PRN
  Administered 2023-04-11 – 2023-04-14 (×9): 1 mg via INTRAVENOUS
  Filled 2023-04-11 (×9): qty 1

## 2023-04-11 MED ORDER — METHOCARBAMOL 500 MG PO TABS
750.0000 mg | ORAL_TABLET | Freq: Four times a day (QID) | ORAL | Status: DC
  Administered 2023-04-11 – 2023-05-13 (×129): 750 mg via ORAL
  Filled 2023-04-11 (×130): qty 2

## 2023-04-11 MED ORDER — DIAZEPAM 2 MG PO TABS
2.0000 mg | ORAL_TABLET | Freq: Once | ORAL | Status: AC
  Administered 2023-04-11: 2 mg via ORAL
  Filled 2023-04-11: qty 1

## 2023-04-11 MED ORDER — OXYCODONE HCL 5 MG PO TABS
15.0000 mg | ORAL_TABLET | ORAL | Status: DC | PRN
  Administered 2023-04-11 – 2023-04-23 (×30): 15 mg via ORAL
  Filled 2023-04-11 (×32): qty 3

## 2023-04-11 NOTE — Progress Notes (Signed)
[facility]    Date of Admission:  03/23/2023   Total days of antibiotics 19   ID: Adam Cabrera is a 37 y.o. 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 Afebrile, but having significant low back pain since pain pump discontinued Medications:   acetaminophen  1,000 mg Oral Q6H   ammonium lactate   Topical BID   docusate sodium  100 mg Oral BID   enoxaparin (LOVENOX) injection  40 mg Subcutaneous Q24H   ferrous sulfate  325 mg Oral Q breakfast   gabapentin  300 mg Oral TID   ketorolac  1 drop Left Eye Q6H   ketorolac  15 mg Intravenous Q6H   methocarbamol  750 mg Oral Q6H   nicotine  21 mg Transdermal Daily   senna-docusate  2 tablet Oral BID   sodium chloride flush  3 mL Intravenous Q12H   sodium chloride flush  3 mL Intravenous Q12H   traZODone  50 mg Oral QHS   trimethoprim-polymyxin b  1 drop Left Eye Q6H    Objective: Vital signs in last 24 hours: Temp:  [97.8 F (36.6 C)-99.2 F (37.3 C)] 98 F (36.7 C) (09/06 0922) Pulse Rate:  [79-120] 109 (09/06 0922) Resp:  [8-20] 18 (09/06 0922) BP: (102-136)/(73-94) 123/76 (09/06 0922) SpO2:  [91 %-100 %] 98 % (09/06 0922) Weight:  [106 kg] 106 kg (09/05 1315)  Physical Exam  Constitutional: He is oriented to person, place, and time. He appears well-developed and well-nourished. No distress.  HENT:   Mouth/Throat: Oropharynx is clear and moist. No oropharyngeal exudate.  Cardiovascular: Normal rate, regular rhythm and normal heart sounds. Exam reveals no gallop and no friction rub.  No murmur heard.  Pulmonary/Chest: Effort normal and breath sounds normal. No respiratory distress. He has no wheezes.  Abdominal: Soft. Bowel sounds are normal. He exhibits no distension. There is no tenderness.  Back = accordian drain in place Lymphadenopathy:  He has no cervical adenopathy.  Neurological: He is alert and oriented to person, place, and time.  Skin: Skin is warm and dry. No rash noted. No erythema.  Psychiatric: He has a normal mood and affect. His behavior is normal.    Lab Results Recent Labs    04/11/23 0502  WBC 9.4  HGB 9.8*  HCT 30.0*  NA 136  K 4.2  CL 102  CO2 26  BUN 15  CREATININE 0.68   Liver Panel Recent Labs    04/11/23 0502  PROT 6.9  ALBUMIN 3.5  AST 21  ALT 13  ALKPHOS 51  BILITOT 1.4*   Lab Results  Component Value Date   ESRSEDRATE 58 (H) 04/01/2023    Microbiology: reviewed Studies/Results: DG Lumbar Spine 2-3 Views  Result Date: 04/10/2023 CLINICAL DATA:  Elective surgery. EXAM: LUMBAR SPINE - 2-3 VIEW COMPARISON:  None Available. FINDINGS:  Five fluoroscopic spot views of the lumbar spine obtained in the operating room. Intraoperative CT images. Pedicle screws at multiple levels. Fluoroscopy time 18 seconds. Dose 120.26 mGy IMPRESSION: Intraoperative fluoroscopy during lumbar spine surgery. Electronically Signed   By: Narda Rutherford M.D.   On: 04/10/2023 21:04   DG C-Arm 1-60 Min-No Report  Result Date: 04/10/2023 Fluoroscopy was utilized by the requesting physician.  No radiographic interpretation.   DG C-Arm 1-60 Min-No Report  Result Date: 04/10/2023 Fluoroscopy was utilized by the requesting physician.  No radiographic interpretation.   DG C-Arm 1-60 Min-No Report  Result Date: 04/10/2023 Fluoroscopy was utilized by the requesting  physician.  No radiographic interpretation.   MR LUMBAR SPINE WO CONTRAST  Result Date: 04/10/2023 CLINICAL DATA:  Initial evaluation for osteomyelitis. EXAM: MRI LUMBAR SPINE WITHOUT CONTRAST TECHNIQUE: Multiplanar, multisequence MR imaging of the lumbar spine was performed. No intravenous contrast was administered. COMPARISON:  Prior MRI and CT from 03/23/2023 as well as recent CT from 04/09/2023. FINDINGS: Segmentation: Standard. Lowest well-formed disc space labeled the L5-S1 level. Alignment: Straightening with mild reversal of the normal cervical lordosis. Grade 1 retrolisthesis of L2 on L3, relatively stable. Vertebrae: Persistent findings of osteomyelitis discitis at L2-3. There has been mild progressive vertebral body height loss about the L2 and L3 vertebral bodies, with progressive endplate erosion/irregularity. Persistent epidural extension with phlegmon/abscess within the ventral epidural space. The ventral epidural abscess measures approximately 1.2 x 0.8 x 3.7 cm (series 11, image 9). Patient has undergone decompressive laminectomy at this level in the interim. There is persistent fairly severe spinal stenosis at this level. Thecal sac measures 9 mm in AP diameter at its most narrow point, previously 6 mm prior to decompression. Associated paraspinous inflammatory changes with bilateral psoas abscesses again seen. Largest of these measures 3.0 x 2.8 cm on the left (series 12, image 25). Postoperative edema within the posterior paraspinous soft tissues. Small postoperative collection at the laminectomy site measures 1.4 x 1.4 x 2.8 cm (series 12, image 20). No other evidence for new or distant infection elsewhere. Vertebral body height otherwise maintained. Underlying bone marrow signal intensity decreased on T1 weighted imaging, nonspecific, but most commonly related to anemia, smoking, or obesity. No worrisome osseous lesions. Conus medullaris and cauda equina: Conus extends to the L1-2 level.  Conus medullaris within normal limits. Crowding of the cauda equina at the level of L2-3. Paraspinal and other soft tissues: Paraspinous inflammatory changes with bilateral psoas abscesses as above. Multiple scattered T2 hyperintense cyst noted about the visualized kidneys, benign in appearance, no follow-up imaging recommended regarding these lesions. Disc levels: L1-2:  Disc desiccation without disc bulge.  No stenosis. L2-3: Persistent findings of osteomyelitis discitis with ventral epidural abscess. Interval decompressive laminectomy. Persistent severe spinal stenosis, although somewhat improved from preoperative exam. Moderate to severe bilateral L2 foraminal narrowing. L3-4: Diffuse disc bulge with reactive endplate change. Mild facet hypertrophy. No spinal stenosis. Foramina remain patent. L4-5: Negative interspace. Mild facet spurring. No significant spinal stenosis. Mild bilateral L4 foraminal narrowing. L5-S1: Negative interspace. Mild facet spurring. No significant canal or foraminal stenosis. IMPRESSION: 1. Persistent findings of osteomyelitis discitis at L2-3 with mildly progressive vertebral body height loss and endplate erosion since previous. Persistent epidural involvement with ventral epidural phlegmon/abscess as above. 2. Sequelae of interval decompressive laminectomy at L2-3. Persistent fairly severe canal and bilateral subarticular stenosis at this level, although overall appears somewhat improved as compared to preoperative exam from 03/23/2023. 3. Associated paraspinous inflammatory changes with bilateral psoas  abscesses as above. 4. No other new or progressive findings elsewhere within the lumbar spine. Electronically Signed   By: Rise Mu M.D.   On: 04/10/2023 03:07   CT LUMBAR SPINE WO CONTRAST  Result Date: 04/09/2023 CLINICAL DATA:  paraspinal abscess, s/p washout. Has worsening pain EXAM: CT LUMBAR SPINE WITHOUT CONTRAST TECHNIQUE: Multidetector CT imaging of the lumbar  spine was performed without intravenous contrast administration. Multiplanar CT image reconstructions were also generated. RADIATION DOSE REDUCTION: This exam was performed according to the departmental dose-optimization program which includes automated exposure control, adjustment of the mA and/or kV according to patient size and/or use of iterative reconstruction technique. COMPARISON:  MR L Spine 03/23/23, CT L spine 03/23/23 FINDINGS: Segmentation: 5 lumbar type vertebrae. Alignment: Retrolisthesis of L2 on L3, unchanged from prior exam Vertebrae: Interval posterior decompression at L2-L3 with a laminectomy at L3. Redemonstrated are changes of discitis osteomyelitis at the inferior and superior endplates of L2 and L3, as well as the superior endplate of L4. There is overall no significant interval change in the appearance of the vertebral bodies compared to recent prior exam from 03/23/2023. There is extensive prevertebral soft tissue stranding. There is also redemonstration of a left psoas abscess measuring up to 3.1 x 2.5 cm, and a right psoas abscess measuring up to 1.6 cm. Redemonstrated mildly displaced fracture through the left L2 transverse process. Paraspinal and other soft tissues: Redemonstrated marked splenomegaly. Disc levels: There is likely persistent severe spinal canal stenosis at L2-L3 (series 5, image 58) and L3-L4 (series 5, image 74). IMPRESSION: 1. Interval posterior decompression at L2-L3 with a laminectomy at L3. 2. Redemonstrated changes of discitis osteomyelitis at L2-L3 and L3-L4, with no significant interval change in the appearance of the vertebral bodies compared to recent prior exam from 03/23/2023. 3. Redemonstrated bilateral psoas abscesses measuring up to 3.1 cm on the left and 1.6 cm on the right. 4. Likely persistent severe spinal canal stenosis at L2-L3 and L3-L4. 5. Redemonstrated mildly displaced fracture through the left L2 transverse process. Electronically Signed   By:  Lorenza Cambridge M.D.   On: 04/09/2023 15:55   DG Lumbar Spine 2-3 Views  Result Date: 04/09/2023 CLINICAL DATA:  Post lumbar spine operation on 08/19 EXAM: LUMBAR SPINE - 2-3 VIEW COMPARISON:  Lumbar spine CT-03/23/2023; lumbar spine-03/23/2023 FINDINGS: Three lateral projection radiographic images of the lumbar spine are provided for review. Neutral standing radiograph demonstrates no change to slight progression of lytic destruction involving the L2-L3 intervertebral disc space when compared to lumbar spine CT performed 03/23/2023 and again worrisome for discitis/osteomyelitis. Unchanged mild increased sclerosis and irregularity about the L3-L4 intervertebral disc space. Remaining lumbar intervertebral disc space heights appear preserved. There is unchanged focal kyphosis at the L2-L3 intervertebral disc space. No anterolisthesis or retrolisthesis. No anterolisthesis or retrolisthesis is elicited given the limited acquired degrees of flexion and extension. IMPRESSION: 1. No change to slight progression of lytic destruction involving the L2-L3 intervertebral disc space when compared to lumbar spine CT performed 03/23/2023 and again worrisome for discitis/osteomyelitis. 2. Unchanged mild increased sclerosis and irregularity about the L3-L4 intervertebral disc space. 3. No evidence of dynamic lumbar spine instability given the limited acquired degrees of flexion and extension. Electronically Signed   By: Simonne Come M.D.   On: 04/09/2023 15:35     Assessment/Plan: MSSA disseminated infection with bacteremia, spinal epidural abscess s/p evacuation but now on 9/5 requiring stabilization with HW/fusion to L1-L4.  - continue on nafcillin  Therapeutic drug monitoring =  tbili slightly elevated at 1.4. plan to check CMP early next week. For the timebeing, continue on nafcillin  Pain management =defer to primary team  Will see back early next week.  I have personally spent 35 minutes involved in face-to-face  and non-face-to-face activities for this patient on the day of the visit. Professional time spent includes the following activities: Preparing to see the patient (review of tests), Obtaining and/or reviewing separately obtained history (admission/discharge record), Performing a medically appropriate examination and/or evaluation , Ordering medications/tests/procedures, referring and communicating with other health care professionals, Documenting clinical information in the EMR, Independently interpreting results (not separately reported), Communicating results to the patient/family/caregiver, Counseling and educating the patient/family/caregiver and Care coordination (not separately reported).     [facility] Pager: [phone]  04/11/2023, 10:02 AM

## 2023-04-11 NOTE — Progress Notes (Addendum)
   Neurosurgery Progress Note  History: Adam Cabrera is s/p L2-3 decompression for epidural phlegmon.  POD18/1: Patient urinating severe back pain.  Reports improvement of his radiating leg pain this morning. POD17: Continue back and R>L posterior thigh pain.  POD16: Patient reports an increase of low back pain and right posterior radiating leg pain yesterday after physical therapy which has persisted today.  He has had worsening right leg weakness over the last 24 hours. POD4: Improved pain this morning POD3: NAEO POD2: continued back pain. Improved with some medication changes.  POD1: improved leg pain and strength overnight.  Physical Exam: Vitals:   04/11/23 0432 04/11/23 0740  BP:    Pulse:  88  Resp: 16 10  Temp:    SpO2: 99% 97%    AA Ox3  CNI  Strength:3/5 throughout but significantly limited due to postoperative back pain. HV 115 since surgery  Data:  Other tests/results:  Cultures positive for abundant Staph aureus.  Sensitivities pending. Gram stain showing gram positive cocci  MRI L spine 04/09/23  IMPRESSION: 1. Persistent findings of osteomyelitis discitis at L2-3 with mildly progressive vertebral body height loss and endplate erosion since previous. Persistent epidural involvement with ventral epidural phlegmon/abscess as above. 2. Sequelae of interval decompressive laminectomy at L2-3. Persistent fairly severe canal and bilateral subarticular stenosis at this level, although overall appears somewhat improved as compared to preoperative exam from 03/23/2023. 3. Associated paraspinous inflammatory changes with bilateral psoas abscesses as above. 4. No other new or progressive findings elsewhere within the lumbar spine.     Electronically Signed   By: Rise Mu M.D.   On: 04/10/2023 03:07  Assessment/Plan:  Adam Cabrera is a 37 year old presenting with an L2-3 abscess and right greater than left lower extremity weakness status post L2-3  decompression for evacuation of epidural phlegmon.  Unfortunately has had a worsening of symptoms and increased pain overnight prompting a CT lumbar spine showing worsening collapse at L2-3 and L3-4. S/p L1-4 PSF with right L3 facetectomy.  - pain control; DC PCA and ordered oral analgesia with breakthrough IV medication.  Scheduled Tylenol and Robaxin. - DVT prophylaxis; okay to resume on 04/11/2023 - OR this afternoon  - TLSO brace when OOB and ambulating.  Okay to work with therapy until brace is delivered. -Continue Hemovac and wound VAC.  Manning Charity PA-C Department of Neurosurgery

## 2023-04-11 NOTE — Progress Notes (Signed)
Progress Note   Patient: Adam Cabrera:[id number] DOB: 06-01-1986 DOA: 03/23/2023     19 DOS: the patient was seen and examined on 04/11/2023   Brief hospital course: 37 y.o. male with medical history significant for hypertension, scrotal cellulitis, history of IV drug use, presents with low back pain.  MRI of the lumbar spine showed Epidural extension with epidural phlegmon/abscess within the ventral epidural space extending from L1-2 through L3-4.  Blood culture positive for Staph aureus, patient treated with cefepime and vancomycin.  Patient has been seen by ID and neurosurgery, L spine washout performed 8/19. Patient condition had improved up to 8/26, wound VAC removed.  Patient will need 6 weeks of IV antibiotics.  Per TOC, there is no placement option.  Patient currently on on continuous nafcillin infusion.  Patient had a worsening back pain since 9/3, CT and MRI of the lumbar spine were repeated,Showed persistent findings of osteomyelitis discitis at L2-3 with mildly progressive vertebral body height loss and endplate erosion since previous. Persistent epidural involvement with ventral epidural phlegmon/abscess, persistent fairly severe canal and bilateral subarticular stenosis,bilateral psoas abscesses.  Patient was brought to the OR again 9/5.     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 Status post  neurosurgical procedure for L2-L3 decompression for epidural phlegmon on 8/19.  Patient on continuous nafcillin.  ID recommends 6 weeks total of IV antibiotics with nafcillin. Present on admission with tachycardia, tachypnea and epidural abscess.    Patient had a worsening back pain for the last 2 days, after MRI and CT scan, surgery performed 9/5 for repeat washout, right L2-3 facetectomy, L1-4 posterior spinal fusion, L2-3 transforaminal arthrodesis with bone graft, and posterior Lateral arthrodesis.   Postoperatively, patient placed on PCA, still has significant pain. Continue to monitor.   Thrombocytopenia (HCC) Improved.     Fresh blood passed per rectum Iron deficiency anemia Bleeding likely secondary to hemorrhoids.  Seen by GI.  Anusol suppositories.  Hemoglobin still stable postop.   Obesity (BMI 30-39.9) BMI 31.42   Headache Improved.   Hyponatremia Stable.     Subjective: Patient still has significant pain in the back, no short of breath, not on oxygen  Physical Exam: Vitals:   04/11/23 0432 04/11/23 0740 04/11/23 0922 04/11/23 1020  BP:   123/76   Pulse:  88 (!) 109   Resp: 16 10 18 16   Temp:   98 F (36.7 C)   TempSrc:   Oral   SpO2: 99% 97% 98%   Weight:      Height:       General exam: Appears calm and comfortable  Respiratory system: Clear to auscultation. Respiratory effort normal. Cardiovascular system: S1 & S2 heard, RRR. No JVD, murmurs, rubs, gallops or clicks. No pedal edema. Gastrointestinal system: Abdomen is nondistended, soft and nontender. No organomegaly or masses felt. Normal bowel sounds heard. Central nervous system: Alert and oriented. No focal neurological deficits.  Extremities: Symmetric 5 x 5 power. Skin: No rashes, lesions or ulcers Psychiatry: Judgement and insight appear normal. Mood & affect appropriate.    Data Reviewed:  Lab results reviewed.  Family Communication: None  Disposition: Status is: Inpatient Remains  inpatient appropriate because: Severity of disease, IV treatment.  Postop day #1.     Time spent: 35 minutes  Author: Marrion Coy, MD 04/11/2023 11:49 AM  For on call review [email or web address].

## 2023-04-11 NOTE — TOC Progression Note (Signed)
Transition of Care [facility]) - Progression Note    Patient Details  Name: Adam Cabrera MRN: [id number] Date of Birth: 04-24-1986  [facility] CM/SW Contact  Marlowe Sax, RN Phone Number: 04/11/2023, 2:37 PM  Clinical Narrative:    TOC continues to follow patient  He continues on nafcillin IV    Expected Discharge Plan: Home/Self Care Barriers to Discharge: Continued Medical Work up  Expected Discharge Plan and Services                                               Social Determinants of Health (SDOH) Interventions SDOH Screenings   Tobacco Use: High Risk (04/10/2023)    Readmission Risk Interventions     No data to display

## 2023-04-11 NOTE — Progress Notes (Signed)
PCA pump discontinued and verified with charge nurse. 18 mL wasted with charge nurse.

## 2023-04-11 NOTE — Plan of Care (Signed)

## 2023-04-11 NOTE — Evaluation (Signed)
Physical Therapy Re-Evaluation Patient Details Name: Adam Cabrera MRN: [id number] DOB: 22-Nov-1985 Today's Date: 04/11/2023  History of Present Illness  37 y.o. male with medical history significant for hypertension, scrotal cellulitis, history of IV drug use, presents with low back pain.  MRI of the lumbar spine showed Epidural extension with epidural phlegmon/abscess within the ventral epidural space extending from L1-2 through L3-4 and s/p lumbar laminectomy; Pt is now s/p L1-4 PSF with right L3 facetectomy 04/10/23.  Clinical Impression  Pt seen for PT/OT co-evaluation s/p surgery; new therapy orders received.  Prior to recent medical concerns, pt was ambulatory.  Pt reporting 10/10 LBP at rest upon therapy arrival but pt agreeable to therapy; pain increased with activity (nurse updated regarding pt's pain; pt received recent pain medication).  Currently pt is CGA for logrolling in bed (using bed rail); max assist for trunk sidelying to sitting EOB (2nd assist for lines/safety); good sitting balance but pt only able to briefly sit on EOB d/t c/o significant LBP and then L thigh numbness (L thigh numbness resolved after resting in bed); and 2 assist to lay back down in bed.  Limited activity d/t significant LBP.  PA Alycia Rossetti updated regarding pt's symptoms, pain, and sessions activities.  Pt would currently benefit from skilled PT to address noted impairments and functional limitations (see below for any additional details).  Upon hospital discharge, pt would benefit from ongoing therapy.  PT POC reviewed and updated.    If plan is discharge home, recommend the following: Two people to help with walking and/or transfers;Two people to help with bathing/dressing/bathroom;Assistance with cooking/housework;Assist for transportation;Help with stairs or ramp for entrance   Can travel by private vehicle        Equipment Recommendations Rolling walker (2 wheels);Wheelchair (measurements PT);Wheelchair cushion  (measurements PT)  Recommendations for Other Services       Functional Status Assessment Patient has had a recent decline in their functional status and demonstrates the ability to make significant improvements in function in a reasonable and predictable amount of time.     Precautions / Restrictions Precautions Precautions: Back;Fall Precaution Comments: Hemovac; wound vac Required Braces or Orthoses: Spinal Brace Spinal Brace: Thoracolumbosacral orthotic Spinal Brace Comments: Can donn TLSO at edge of bed; per PA Alycia Rossetti 04/11/23 "He can go to the bathroom and ambulate around the room without it but if he's going to do much more than that he needs to have it on" Restrictions Weight Bearing Restrictions: No      Mobility  Bed Mobility Overal bed mobility: Needs Assistance Bed Mobility: Rolling, Sidelying to Sit, Sit to Sidelying Rolling: +2 for physical assistance, Contact guard assist, +2 for safety/equipment Sidelying to sit: Max assist, +2 for safety/equipment (assist for trunk)     Sit to sidelying: Min assist, Max assist, +2 for physical assistance, +2 for safety/equipment (max assist for LE's; min assist for trunk) General bed mobility comments: vc's for technique    Transfers                   General transfer comment: Unable to attempt d/t significant LBP    Ambulation/Gait                  Stairs            Wheelchair Mobility     Tilt Bed    Modified Rankin (Stroke Patients Only)       Balance Overall balance assessment: Needs assistance Sitting-balance support: Bilateral upper extremity supported,  Feet supported Sitting balance-Leahy Scale: Good Sitting balance - Comments: steady reaching within BOS       Standing balance comment: unable to stand d/t significant LBP                             Pertinent Vitals/Pain Pain Assessment Pain Assessment: 0-10 Pain Score: 10-Worst pain ever Pain Location: Low back Pain  Descriptors / Indicators: Discomfort, Grimacing, Guarding, Moaning, Tingling Pain Intervention(s): Limited activity within patient's tolerance, Monitored during session, Premedicated before session, Repositioned, Other (comment) (Nurse notified) SpO2 sats (on room air) stable and WFL throughout treatment session.    Home Living Family/patient expects to be discharged to:: Private residence Living Arrangements: Parent Available Help at Discharge: Family;Available PRN/intermittently Type of Home: House Home Access: Stairs to enter Entrance Stairs-Rails: Doctor, general practice of Steps: 6   Home Layout: One level Home Equipment: None      Prior Function Prior Level of Function : Independent/Modified Independent                     Extremity/Trunk Assessment   Upper Extremity Assessment Upper Extremity Assessment: Generalized weakness    Lower Extremity Assessment Lower Extremity Assessment: Generalized weakness (pt reporting tingling in B feet (is better after this recent surgery per pt report); pt also reporting L thigh numbness sitting EOB (resolved laying down in bed); difficult to assess LE's d/t significant LBP with any movement)    Cervical / Trunk Assessment Cervical / Trunk Assessment: Back Surgery  Communication   Communication Communication: No apparent difficulties Cueing Techniques: Verbal cues  Cognition Arousal: Alert Behavior During Therapy: WFL for tasks assessed/performed Overall Cognitive Status: Within Functional Limits for tasks assessed                                 General Comments: Agreeable to session but limited by pain        General Comments General comments (skin integrity, edema, etc.): wound vac and hemovac appearing intact beginning/end of session; skin breakdown noted sacral area (nurse notified and placed sacral pad)    Exercises     Assessment/Plan    PT Assessment Patient needs continued PT  services  PT Problem List Decreased strength;Decreased mobility;Decreased range of motion;Decreased activity tolerance;Decreased balance;Decreased knowledge of precautions;Pain;Decreased knowledge of use of DME;Decreased skin integrity       PT Treatment Interventions DME instruction;Therapeutic exercise;Stair training;Functional mobility training;Therapeutic activities;Gait training;Neuromuscular re-education;Patient/family education    PT Goals (Current goals can be found in the Care Plan section)  Acute Rehab PT Goals Patient Stated Goal: less pain PT Goal Formulation: With patient Time For Goal Achievement: 04/25/23 Potential to Achieve Goals: Fair    Frequency Min 1X/week     Co-evaluation   Reason for Co-Treatment: Complexity of the patient's impairments (multi-system involvement);For patient/therapist safety;To address functional/ADL transfers PT goals addressed during session: Mobility/safety with mobility;Balance;Proper use of DME;Strengthening/ROM OT goals addressed during session: ADL's and self-care       AM-PAC PT "6 Clicks" Mobility  Outcome Measure Help needed turning from your back to your side while in a flat bed without using bedrails?: A Little Help needed moving from lying on your back to sitting on the side of a flat bed without using bedrails?: A Lot Help needed moving to and from a bed to a chair (including a wheelchair)?: Total Help needed standing up  from a chair using your arms (e.g., wheelchair or bedside chair)?: Total Help needed to walk in hospital room?: Total Help needed climbing 3-5 steps with a railing? : Total 6 Click Score: 9    End of Session   Activity Tolerance: Patient limited by pain Patient left: in bed;with call bell/phone within reach;with bed alarm set Nurse Communication: Mobility status;Precautions (pt's pain status) PT Visit Diagnosis: Pain;Other abnormalities of gait and mobility (R26.89);Muscle weakness (generalized)  (M62.81) Pain - part of body:  (LBP)    Time: 4132-4401 PT Time Calculation (min) (ACUTE ONLY): 22 min   Charges:   PT Evaluation $PT Re-evaluation: 1 Re-eval   PT General Charges $$ ACUTE PT VISIT: 1 Visit        Hendricks Limes, PT 04/11/23, 5:35 PM

## 2023-04-11 NOTE — Progress Notes (Signed)
Occupational Therapy Re-evaluation  Patient Details Name: Adam Cabrera MRN: [id number] DOB: [birth year] Today's Date: 04/11/2023   History of present illness 37 y.o. male with medical history significant for hypertension, scrotal cellulitis, history of IV drug use, presents with low back pain.  MRI of the lumbar spine showed Epidural extension with epidural phlegmon/abscess within the ventral epidural space extending from L1-2 through L3-4 and s/p lumbar laminectomy; Pt is now s/pS/p L1-4 PSF with right L3 facetectomy.04/10/23   OT comments  Chart reviewed, pt greeted in bed, reporting 10/10 pain despite pre medication. Re-evaluation orders received s/p procedure 9/5. Pt mobility is limited by pain on this date with pt crying out throughout attempted mobility.  Wound vac/hemovac intact pre/post session. Pt limited to edge of bed, unable to tolerate progressing of further mobility. MAX A for LB ADLs on this date, SET UP for feeding tasks. Pt will continue to benefit from ongoing skilled OT to address deficits and to facilitate return to PLOF. OT will follow acutely.       If plan is discharge home, recommend the following:  Two people to help with walking and/or transfers;Two people to help with bathing/dressing/bathroom;Assistance with cooking/housework;Assist for transportation;Help with stairs or ramp for entrance   Equipment Recommendations  BSC/3in1;Tub/shower seat    Recommendations for Other Services      Precautions / Restrictions Precautions Precautions: Back;Fall Required Braces or Orthoses: Spinal Brace Spinal Brace: Thoracolumbosacral orthotic Spinal Brace Comments: can donn at edge of bed, per PA "He can go to the bathroom and ambulate around the room without it but if he's going to do much more than that he needs to have it on" Restrictions Weight Bearing Restrictions: No       Mobility Bed Mobility Overal bed mobility: Needs Assistance Bed Mobility: Rolling, Sidelying to  Sit, Sit to Sidelying Rolling: +2 for physical assistance, Contact guard assist Sidelying to sit: Max assist, +2 for physical assistance     Sit to sidelying: Min assist, Max assist (MIN A for trunk, MAX A for BLE, +2)      Transfers                   General transfer comment: unable to attempt on this date due to pain     Balance Overall balance assessment: Needs assistance Sitting-balance support: Feet supported Sitting balance-Leahy Scale: Good         Standing balance comment: pt severely limited by pain on this date, unable to attempt                           ADL either performed or assessed with clinical judgement   ADL Overall ADL's : Needs assistance/impaired Eating/Feeding: Set up;Sitting               Upper Body Dressing : Maximal assistance   Lower Body Dressing: Maximal assistance Lower Body Dressing Details (indicate cue type and reason): socks               General ADL Comments: limited by pain    Extremity/Trunk Assessment Upper Extremity Assessment Upper Extremity Assessment: Generalized weakness;Difficult to assess due to impaired cognition   Lower Extremity Assessment Lower Extremity Assessment: Generalized weakness;Difficult to assess due to impaired cognition (pt with reported tingling in B feet, L thigh numbness with mobility, resolved with back to bed)   Cervical / Trunk Assessment Cervical / Trunk Assessment: Back Surgery    Vision Patient Visual  Report: No change from baseline Additional Comments: will continue to assess   Perception     Praxis      Cognition Arousal: Alert Behavior During Therapy: WFL for tasks assessed/performed Overall Cognitive Status: Within Functional Limits for tasks assessed                                          Exercises      Shoulder Instructions       General Comments wound vac, hemovac in place pre/post session; breakdown noted on sacral area,  nurse in room to assess and place sacral pad    Pertinent Vitals/ Pain       Pain Assessment Pain Assessment: 0-10 Pain Score: 10-Worst pain ever Pain Descriptors / Indicators: Discomfort, Grimacing, Guarding, Moaning, Tingling Pain Intervention(s): Monitored during session, Limited activity within patient's tolerance, Premedicated before session, Repositioned  Home Living                                          Prior Functioning/Environment              Frequency  Min 1X/week        Progress Toward Goals  OT Goals(current goals can now be found in the care plan section)  Progress towards OT goals: Progressing toward goals  Acute Rehab OT Goals Patient Stated Goal: improve pain OT Goal Formulation: With patient Time For Goal Achievement: 04/25/23 Potential to Achieve Goals: Good  Plan      Co-evaluation    PT/OT/SLP Co-Evaluation/Treatment: Yes Reason for Co-Treatment: Complexity of the patient's impairments (multi-system involvement);For patient/therapist safety;To address functional/ADL transfers PT goals addressed during session: Mobility/safety with mobility;Balance;Proper use of DME;Strengthening/ROM OT goals addressed during session: ADL's and self-care      AM-PAC OT "6 Clicks" Daily Activity     Outcome Measure   Help from another person eating meals?: None Help from another person taking care of personal grooming?: A Little Help from another person toileting, which includes using toliet, bedpan, or urinal?: A Lot Help from another person bathing (including washing, rinsing, drying)?: A Lot Help from another person to put on and taking off regular upper body clothing?: A Lot Help from another person to put on and taking off regular lower body clothing?: A Lot 6 Click Score: 15    End of Session    OT Visit Diagnosis: Unsteadiness on feet (R26.81);Muscle weakness (generalized) (M62.81)   Activity Tolerance Patient limited by  pain   Patient Left in bed;with call bell/phone within reach;with bed alarm set   Nurse Communication Mobility status;Other (comment) (breakdown)        Time: 5284-1324 OT Time Calculation (min): 21 min  Charges: OT General Charges $OT Visit: 1 Visit OT Evaluation $OT Re-eval: 1 Re-eval OT Treatments $Therapeutic Activity: 8-22 mins  Oleta Mouse, OTD OTR/L  04/11/23, 3:54 PM

## 2023-04-12 DIAGNOSIS — D62 Acute posthemorrhagic anemia: Secondary | ICD-10-CM | POA: Insufficient documentation

## 2023-04-12 DIAGNOSIS — G061 Intraspinal abscess and granuloma: Secondary | ICD-10-CM | POA: Diagnosis not present

## 2023-04-12 DIAGNOSIS — K6812 Psoas muscle abscess: Secondary | ICD-10-CM | POA: Diagnosis not present

## 2023-04-12 DIAGNOSIS — A4101 Sepsis due to Methicillin susceptible Staphylococcus aureus: Secondary | ICD-10-CM | POA: Diagnosis not present

## 2023-04-12 DIAGNOSIS — E876 Hypokalemia: Secondary | ICD-10-CM | POA: Insufficient documentation

## 2023-04-12 LAB — BASIC METABOLIC PANEL
Anion gap: 8 (ref 5–15)
BUN: 11 mg/dL (ref 6–20)
CO2: 25 mmol/L (ref 22–32)
Calcium: 8.1 mg/dL — ABNORMAL LOW (ref 8.9–10.3)
Chloride: 102 mmol/L (ref 98–111)
Creatinine, Ser: 0.47 mg/dL — ABNORMAL LOW (ref 0.61–1.24)
GFR, Estimated: >60 mL/min (ref >60)
Glucose, Bld: 107 mg/dL — ABNORMAL HIGH (ref 70–99)
Potassium: 3.3 mmol/L — ABNORMAL LOW (ref 3.5–5.1)
Sodium: 135 mmol/L (ref 135–145)

## 2023-04-12 LAB — CBC
HCT: 23.9 % — ABNORMAL LOW (ref 39.0–52.0)
Hemoglobin: 7.9 g/dL — ABNORMAL LOW (ref 13.0–17.0)
MCH: 28.2 pg (ref 26.0–34.0)
MCHC: 33.1 g/dL (ref 30.0–36.0)
MCV: 85.4 fL (ref 80.0–100.0)
Platelets: 143 10*3/uL — ABNORMAL LOW (ref 150–400)
RBC: 2.8 MIL/uL — ABNORMAL LOW (ref 4.22–5.81)
RDW: 19.7 % — ABNORMAL HIGH (ref 11.5–15.5)
WBC: 6.2 10*3/uL (ref 4.0–10.5)
nRBC: 0 % (ref 0.0–0.2)

## 2023-04-12 LAB — MAGNESIUM: Magnesium: 1.8 mg/dL (ref 1.7–2.4)

## 2023-04-12 MED ORDER — SODIUM CHLORIDE 0.9 % IV SOLN
300.0000 mg | Freq: Once | INTRAVENOUS | Status: AC
  Administered 2023-04-12: 300 mg via INTRAVENOUS
  Filled 2023-04-12: qty 300

## 2023-04-12 MED ORDER — POTASSIUM CHLORIDE CRYS ER 20 MEQ PO TBCR
40.0000 meq | EXTENDED_RELEASE_TABLET | ORAL | Status: AC
  Administered 2023-04-12 (×2): 40 meq via ORAL
  Filled 2023-04-12 (×2): qty 2

## 2023-04-12 NOTE — Progress Notes (Signed)
Neurosurgery visit note Postop day 2 status post L2-3 laminectomy for epidural abscess evacuation and placement of Hemovac drain as well as placement of VAC dressing. Patient continues to note back pain and soreness when trying to mobilize.  However has been able to mobilize.  He notes ongoing right quadricep weakness stable from preoperative. Hemovac drain remains in place with significant serosanguineous output.  The VAC is in place and functioning  Physical exam is awake alert and appropriate.  He is complaining of low back pain.  His right quadriceps is proximally 3 out of 5 the remainder of his neurologic exam the lower extremities are grossly full-strength and sensation.  Dressings are clean dry and intact  AP: Continue pain control with the primary team, patient may mobilize as tolerated.  Continue to Hemovac drain for now as well as the VAC dressing.  Neurosurgery will continue to follow  Melina Modena, MD Neurosurgery

## 2023-04-12 NOTE — Progress Notes (Signed)
Progress Note   Patient: Adam Cabrera ZOX:[id number] DOB: Jan 18, 1986 DOA: 03/23/2023     20 DOS: the patient was seen and examined on 04/12/2023   Brief hospital course: 37 y.o. male with medical history significant for hypertension, scrotal cellulitis, history of IV drug use, presents with low back pain.  MRI of the lumbar spine showed Epidural extension with epidural phlegmon/abscess within the ventral epidural space extending from L1-2 through L3-4.  Blood culture positive for Staph aureus, patient treated with cefepime and vancomycin.  Patient has been seen by ID and neurosurgery, L spine washout performed 8/19. Patient condition had improved up to 8/26, wound VAC removed.  Patient will need 6 weeks of IV antibiotics.  Per TOC, there is no placement option.  Patient currently on on continuous nafcillin infusion.  Patient had a worsening back pain since 9/3, CT and MRI of the lumbar spine were repeated,Showed persistent findings of osteomyelitis discitis at L2-3 with mildly progressive vertebral body height loss and endplate erosion since previous. Persistent epidural involvement with ventral epidural phlegmon/abscess, persistent fairly severe canal and bilateral subarticular stenosis,bilateral psoas abscesses.  Patient was brought to the OR again 9/5.     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 Status post neurosurgical procedure for L2-L3 decompression for epidural phlegmon on 8/19.  Patient on continuous nafcillin.  ID recommends 6 weeks total of IV antibiotics with nafcillin. Present on admission with tachycardia, tachypnea and epidural abscess.    Patient had a worsening back pain for the last 2 days, after MRI and CT scan, surgery performed 9/5 for repeat washout, right L2-3 facetectomy, L1-4 posterior spinal fusion, L2-3 transforaminal arthrodesis with bone graft, and posterior Lateral arthrodesis.    Patient still has significant pain in the back, continue as needed pain medicine and symptomatic treatment.   Thrombocytopenia (HCC) Acute blood loss anemia.  Fresh blood passed per rectum resolved Iron deficiency anemia Bleeding likely secondary to hemorrhoids.  Seen by GI.  Anusol suppositories.   Patient hemoglobin dropped down to 7.9 on 9/7.  Give another dose of IV iron.  Continue monitor CBC.    Hyponatremia Hypokalemia. Sodium level normalized, replete potassium today.   Obesity (BMI 30-39.9) BMI 31.42   Headache Improved.       Subjective:  Patient still complaining of severe low back pain, receiving as needed pain medicine.  PCA discontinued by surgery.  Physical Exam: Vitals:   04/11/23 1020 04/11/23 1539 04/12/23 0012 04/12/23 0848  BP:  99/64 121/77 121/73  Pulse:  99 (!) 108 (!) 104  Resp: 16 16 18 16   Temp:  98.1 F (36.7 C) 97.8 F (36.6 C) 97.8 F (36.6 C)  TempSrc:      SpO2:  100% 99% 100%  Weight:      Height:       General exam: Appears calm and comfortable  Respiratory system:  Clear to auscultation. Respiratory effort normal. Cardiovascular system: S1 & S2 heard, RRR. No JVD, murmurs, rubs, gallops or clicks. No pedal edema. Gastrointestinal system: Abdomen is nondistended, soft and nontender. No organomegaly or masses felt. Normal bowel sounds heard. Central nervous system: Alert and oriented x3. No focal  neurological deficits. Extremities: Symmetric 5 x 5 power. Skin: No rashes, lesions or ulcers Psychiatry: Judgement and insight appear normal. Mood & affect appropriate.    Data Reviewed:  Lab results reviewed.  Family Communication: None  Disposition: Status is: Inpatient Remains inpatient appropriate because: Severity of disease, IV treatment.     Time spent: 35 minutes  Author: Marrion Coy, MD 04/12/2023 12:01 PM  For on call review [email or web address].

## 2023-04-12 NOTE — Progress Notes (Signed)
Physical Therapy Treatment Patient Details Name: Adam Cabrera MRN: [id number] DOB: 18-Dec-1985 Today's Date: 04/12/2023   History of Present Illness 37 y.o. male with medical history significant for hypertension, scrotal cellulitis, history of IV drug use, presents with low back pain.  MRI of the lumbar spine showed Epidural extension with epidural phlegmon/abscess within the ventral epidural space extending from L1-2 through L3-4 and s/p lumbar laminectomy; Pt is now s/p L1-4 PSF with right L3 facetectomy 04/10/23.    PT Comments  Pt is progressing with mobility performing bed mobility with Min to MaxA (for Les), sit<>stands with Mod A +2 for safety and sidestepping 6 ft with RW with Min A.  Pt continues to need a lot of time to perform mobility due to severe back pain.  Pt would currently benefit from continued skilled PT to address noted impairments and functional limitations (see below for any additional details). Upon hospital discharge, pt would benefit from ongoing therapy.    If plan is discharge home, recommend the following: Two people to help with walking and/or transfers;Two people to help with bathing/dressing/bathroom;Assistance with cooking/housework;Assist for transportation;Help with stairs or ramp for entrance   Can travel by private vehicle        Equipment Recommendations  Rolling walker (2 wheels);Wheelchair (measurements PT);Wheelchair cushion (measurements PT)    Recommendations for Other Services       Precautions / Restrictions Precautions Precautions: Back;Fall Precaution Comments: Hemovac; wound vac Required Braces or Orthoses: Spinal Brace Spinal Brace: Thoracolumbosacral orthotic Spinal Brace Comments: Can donn TLSO at edge of bed; per PA Alycia Rossetti 04/11/23 "He can go to the bathroom and ambulate around the room without it but if he's going to do much more than that he needs to have it on" Restrictions Weight Bearing Restrictions: No     Mobility  Bed  Mobility Overal bed mobility: Needs Assistance Bed Mobility: Rolling, Sidelying to Sit, Sit to Sidelying Rolling: Min assist Sidelying to sit: Min assist Supine to sit: Min assist, Used rails, HOB elevated Sit to supine: Used rails, Max assist Sit to sidelying: Max assist General bed mobility comments: Max A to manage LEs sit>supine, pt required increased time but was able to perform with one person.    Transfers Overall transfer level: Needs assistance Equipment used: Rolling walker (2 wheels) Transfers: Sit to/from Stand Sit to Stand: Mod assist, +2 physical assistance (for safety)                Ambulation/Gait Ambulation/Gait assistance: Min assist Gait Distance (Feet): 6 Feet Assistive device: Rolling walker (2 wheels) Gait Pattern/deviations: Step-to pattern, Wide base of support, Shuffle Gait velocity: decreased     General Gait Details: side step along the EOB each direction.   Stairs             Wheelchair Mobility     Tilt Bed    Modified Rankin (Stroke Patients Only)       Balance Overall balance assessment: Independent Sitting-balance support: Bilateral upper extremity supported, Feet supported Sitting balance-Leahy Scale: Good Sitting balance - Comments: steady reaching within BOS   Standing balance support: Bilateral upper extremity supported, During functional activity, Reliant on assistive device for balance Standing balance-Leahy Scale: Fair Standing balance comment: unable to stand d/t significant LBP                            Cognition Arousal: Alert Behavior During Therapy: WFL for tasks assessed/performed Overall Cognitive Status: Within  Functional Limits for tasks assessed                                 General Comments: Agreeable to session but limited by pain        Exercises      General Comments        Pertinent Vitals/Pain Pain Assessment Faces Pain Scale: Hurts whole lot Pain  Location: Low back Pain Descriptors / Indicators: Discomfort, Grimacing, Guarding, Moaning, Tingling Pain Intervention(s): Premedicated before session, Monitored during session, Limited activity within patient's tolerance    Home Living                          Prior Function            PT Goals (current goals can now be found in the care plan section) Acute Rehab PT Goals Patient Stated Goal: less pain PT Goal Formulation: With patient Time For Goal Achievement: 04/25/23 Potential to Achieve Goals: Fair Progress towards PT goals: Progressing toward goals    Frequency    Min 1X/week      PT Plan      Co-evaluation              AM-PAC PT "6 Clicks" Mobility   Outcome Measure  Help needed turning from your back to your side while in a flat bed without using bedrails?: A Little Help needed moving from lying on your back to sitting on the side of a flat bed without using bedrails?: A Little Help needed moving to and from a bed to a chair (including a wheelchair)?: A Lot Help needed standing up from a chair using your arms (e.g., wheelchair or bedside chair)?: A Lot Help needed to walk in hospital room?: A Lot Help needed climbing 3-5 steps with a railing? : Total 6 Click Score: 13    End of Session Equipment Utilized During Treatment: Gait belt Activity Tolerance: Patient limited by pain Patient left: in bed;with call bell/phone within reach;with bed alarm set Nurse Communication: Mobility status;Precautions PT Visit Diagnosis: Pain;Other abnormalities of gait and mobility (R26.89);Muscle weakness (generalized) (M62.81) Pain - Right/Left: Right     Time: 4259-5638 PT Time Calculation (min) (ACUTE ONLY): 25 min  Charges:    $Therapeutic Activity: 23-37 mins PT General Charges $$ ACUTE PT VISIT: 1 Visit                     Hortencia Conradi, PTA  04/12/23, 3:11 PM

## 2023-04-13 DIAGNOSIS — G061 Intraspinal abscess and granuloma: Secondary | ICD-10-CM | POA: Diagnosis not present

## 2023-04-13 DIAGNOSIS — A4101 Sepsis due to Methicillin susceptible Staphylococcus aureus: Secondary | ICD-10-CM | POA: Diagnosis not present

## 2023-04-13 DIAGNOSIS — M8608 Acute hematogenous osteomyelitis, other sites: Secondary | ICD-10-CM | POA: Diagnosis not present

## 2023-04-13 LAB — CBC
HCT: 23.4 % — ABNORMAL LOW (ref 39.0–52.0)
Hemoglobin: 7.4 g/dL — ABNORMAL LOW (ref 13.0–17.0)
MCH: 28.1 pg (ref 26.0–34.0)
MCHC: 31.6 g/dL (ref 30.0–36.0)
MCV: 89 fL (ref 80.0–100.0)
Platelets: 136 10*3/uL — ABNORMAL LOW (ref 150–400)
RBC: 2.63 MIL/uL — ABNORMAL LOW (ref 4.22–5.81)
RDW: 20.3 % — ABNORMAL HIGH (ref 11.5–15.5)
WBC: 4.9 10*3/uL (ref 4.0–10.5)
nRBC: 0 % (ref 0.0–0.2)

## 2023-04-13 LAB — BASIC METABOLIC PANEL
Anion gap: 7 (ref 5–15)
BUN: 5 mg/dL — ABNORMAL LOW (ref 6–20)
CO2: 27 mmol/L (ref 22–32)
Calcium: 8.4 mg/dL — ABNORMAL LOW (ref 8.9–10.3)
Chloride: 102 mmol/L (ref 98–111)
Creatinine, Ser: 0.45 mg/dL — ABNORMAL LOW (ref 0.61–1.24)
GFR, Estimated: >60 mL/min (ref >60)
Glucose, Bld: 95 mg/dL (ref 70–99)
Potassium: 3.4 mmol/L — ABNORMAL LOW (ref 3.5–5.1)
Sodium: 136 mmol/L (ref 135–145)

## 2023-04-13 LAB — HEMOGLOBIN: Hemoglobin: 7.8 g/dL — ABNORMAL LOW (ref 13.0–17.0)

## 2023-04-13 LAB — MAGNESIUM: Magnesium: 1.9 mg/dL (ref 1.7–2.4)

## 2023-04-13 MED ORDER — POTASSIUM CHLORIDE CRYS ER 20 MEQ PO TBCR
40.0000 meq | EXTENDED_RELEASE_TABLET | ORAL | Status: AC
  Administered 2023-04-13 (×2): 40 meq via ORAL
  Filled 2023-04-13 (×2): qty 2

## 2023-04-13 NOTE — Progress Notes (Signed)
Progress Note   Patient: Adam Cabrera YQI:[id number] DOB: [birth year] DOA: 03/23/2023     21 DOS: the patient was seen and examined on 04/13/2023   Brief hospital course: 37 y.o. male with medical history significant for hypertension, scrotal cellulitis, history of IV drug use, presents with low back pain.  MRI of the lumbar spine showed Epidural extension with epidural phlegmon/abscess within the ventral epidural space extending from L1-2 through L3-4.  Blood culture positive for Staph aureus, patient treated with cefepime and vancomycin.  Patient has been seen by ID and neurosurgery, L spine washout performed 8/19. Patient condition had improved up to 8/26, wound VAC removed.  Patient will need 6 weeks of IV antibiotics.  Per TOC, there is no placement option.  Patient currently on on continuous nafcillin infusion.  Patient had a worsening back pain since 9/3, CT and MRI of the lumbar spine were repeated,Showed persistent findings of osteomyelitis discitis at L2-3 with mildly progressive vertebral body height loss and endplate erosion since previous. Persistent epidural involvement with ventral epidural phlegmon/abscess, persistent fairly severe canal and bilateral subarticular stenosis,bilateral psoas abscesses.  Patient was brought to the OR again 9/5.     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 Status post neurosurgical procedure for L2-L3 decompression for epidural phlegmon on 8/19.  Patient on continuous nafcillin.  ID recommends 6 weeks total of IV antibiotics with nafcillin. Present on admission with tachycardia, tachypnea and epidural abscess.    Patient had a worsening back pain for the last 2 days, after MRI and CT scan, surgery performed 9/5 for repeat washout, right L2-3 facetectomy, L1-4 posterior spinal fusion, L2-3 transforaminal arthrodesis with bone graft, and posterior Lateral arthrodesis.    Postoperatively, patient had significant back pain, which is due to getting better.  Continue PT/OT.  Continue pain medicine as needed.   Thrombocytopenia (HCC) Acute blood loss anemia.  Fresh blood passed per rectum resolved Iron deficiency anemia Bleeding likely secondary to hemorrhoids.  Seen by GI.  Anusol suppositories.   Patient hemoglobin dropped down to 7.9 on 9/7.  Received IV iron. Hemoglobin dropped down to 7.4 today, continue to follow, transfuse when hemoglobin less than 7.0.     Hyponatremia Hypokalemia. Given more potassium today.   Obesity (BMI 30-39.9) BMI 31.42   Headache Improved.      Subjective:  Patient still have significant back pain, but appear to be doing better with mobility.  Physical Exam: Vitals:   04/12/23 1518 04/12/23 2354 04/13/23 0500 04/13/23 0816  BP: 123/72 (!) 127/58  128/87  Pulse: (!) 106 92  90  Resp: 16 20  16   Temp: 98.3 F (36.8 C) 98.6 F (37 C)  98.1 F (36.7 C)  TempSrc:  Oral    SpO2: 99% 99%  100%  Weight:   100.5 kg   Height:       General  exam: Appears calm and comfortable  Respiratory system: Clear to auscultation. Respiratory effort normal. Cardiovascular system: S1 & S2 heard, RRR. No JVD, murmurs, rubs, gallops or clicks. No pedal edema. Gastrointestinal system: Abdomen is nondistended, soft and nontender. No organomegaly or masses felt. Normal bowel sounds heard. Central nervous  system: Alert and oriented. No focal neurological deficits. Extremities: Symmetric 5 x 5 power. Skin: No rashes, lesions or ulcers Psychiatry: Judgement and insight appear normal. Mood & affect appropriate.     Data Reviewed:  Lab results reviewed.  Family Communication: None  Disposition: Status is: Inpatient Remains inpatient appropriate because: Severity of disease, IV treatment.     Time spent: 35 minutes  Author: Marrion Coy, MD 04/13/2023 2:16 PM  For on call review [email or web address].

## 2023-04-13 NOTE — Progress Notes (Signed)
Neurosurgery visit note Patient seen and examined.  He continues to report low back pain.  He mobilized with physical therapy yesterday but has not been aggressively getting out of bed and mobilizing.  He notes he continues to have low back pain. Hemovac continues to put out fair amount of serosanguineous fluid the VAC dressing is in place.  Examination of back shows the area to be clean dry and intact no obvious swelling or fluctuance Neuro exam notable again for his right quad weakness approximately 3 out of 5 otherwise grossly full strength motor in his lower extremities stable sensation and slight decreased light touch to the right quad area.  AP: Overall the patient can continue to mobilize with encouraged him to do so along with both physical therapy as well as nursing staff. Will keep Hemovac 1 more day likely out tomorrow Pain control as you are doing  Peter Garter. Madaline Brilliant, MD Neurosurgery

## 2023-04-13 NOTE — Progress Notes (Signed)
Physical Therapy Treatment Patient Details Name: Adam Cabrera MRN: [id number] DOB: [birth year] Today's Date: 04/13/2023   History of Present Illness 37 y.o. male with medical history significant for hypertension, scrotal cellulitis, history of IV drug use, presents with low back pain.  MRI of the lumbar spine showed Epidural extension with epidural phlegmon/abscess within the ventral epidural space extending from L1-2 through L3-4 and s/p lumbar laminectomy; Pt is now s/p L1-4 PSF with right L3 facetectomy 04/10/23.    PT Comments  Ready to try asking to use the bathroom.  He is able to transition to EOB with heavy use of rail and min a x 1.  Generally steady with arm support limited by pain.  Stands with mod a x 2 from elevated bed and transfers to [facility] with min a x 2 and cues.  He needs extended time on [facility] but is able to have a very large soft BM.  Assist for care then transferred back to bed with mod a x 2 to stand and min a x 2 to transfer.  Before sitting he is able to stand to march in place and SLR before fatigue then needs Mod a x 1 to return to supine for LE's.     If plan is discharge home, recommend the following: Two people to help with walking and/or transfers;Two people to help with bathing/dressing/bathroom;Assistance with cooking/housework;Assist for transportation;Help with stairs or ramp for entrance   Can travel by private vehicle        Equipment Recommendations  Rolling walker (2 wheels);Wheelchair (measurements PT);Wheelchair cushion (measurements PT)    Recommendations for Other Services       Precautions / Restrictions Precautions Precautions: Back;Fall Precaution Comments: Hemovac; wound vac Required Braces or Orthoses: Spinal Brace Spinal Brace: Thoracolumbosacral orthotic Spinal Brace Comments: Can donn TLSO at edge of bed; per PA Alycia Rossetti 04/11/23 "He can go to the bathroom and ambulate around the room without it but if he's going to do much more than that he needs to  have it on" Restrictions Weight Bearing Restrictions: No     Mobility  Bed Mobility Overal bed mobility: Needs Assistance Bed Mobility: Rolling, Sidelying to Sit, Sit to Sidelying Rolling: Min assist Sidelying to sit: Min assist   Sit to supine: Used rails, Mod assist     Patient Response: Cooperative  Transfers Overall transfer level: Needs assistance Equipment used: Rolling walker (2 wheels) Transfers: Sit to/from Stand Sit to Stand: Min assist, Mod assist, +2 physical assistance, From elevated surface   Step pivot transfers: Min assist, +2 physical assistance            Ambulation/Gait Ambulation/Gait assistance: Min assist Gait Distance (Feet): 3 Feet Assistive device: Rolling walker (2 wheels) Gait Pattern/deviations: Step-to pattern, Wide base of support, Shuffle Gait velocity: decreased     General Gait Details: transfer to/from [facility] for XXXL soft BM   Stairs             Wheelchair Mobility     Tilt Bed Tilt Bed Patient Response: Cooperative  Modified Rankin (Stroke Patients Only)       Balance Overall balance assessment: Needs assistance Sitting-balance support: Bilateral upper extremity supported, Feet supported Sitting balance-Leahy Scale: Fair     Standing balance support: Bilateral upper extremity supported, During functional activity, Reliant on assistive device for balance Standing balance-Leahy Scale: Fair Standing balance comment: heavy reliance on RW but imrpoved since last time I saw pt last week  Cognition Arousal: Alert Behavior During Therapy: WFL for tasks assessed/performed Overall Cognitive Status: Within Functional Limits for tasks assessed                                 General Comments: Agreeable to session but limited by pain        Exercises      General Comments        Pertinent Vitals/Pain Pain Assessment Pain Assessment: Faces Faces Pain Scale:  Hurts whole lot Pain Location: Low back Pain Descriptors / Indicators: Discomfort, Grimacing, Guarding, Moaning, Tingling Pain Intervention(s): Limited activity within patient's tolerance, Monitored during session, Repositioned    Home Living                          Prior Function            PT Goals (current goals can now be found in the care plan section) Progress towards PT goals: Progressing toward goals    Frequency    Min 1X/week      PT Plan      Co-evaluation              AM-PAC PT "6 Clicks" Mobility   Outcome Measure  Help needed turning from your back to your side while in a flat bed without using bedrails?: A Little Help needed moving from lying on your back to sitting on the side of a flat bed without using bedrails?: A Little Help needed moving to and from a bed to a chair (including a wheelchair)?: A Lot Help needed standing up from a chair using your arms (e.g., wheelchair or bedside chair)?: A Lot Help needed to walk in hospital room?: A Lot Help needed climbing 3-5 steps with a railing? : Total 6 Click Score: 13    End of Session Equipment Utilized During Treatment: Gait belt Activity Tolerance: Patient limited by pain Patient left: in bed;with call bell/phone within reach;with bed alarm set Nurse Communication: Mobility status;Precautions PT Visit Diagnosis: Pain;Other abnormalities of gait and mobility (R26.89);Muscle weakness (generalized) (M62.81) Pain - Right/Left: Right     Time: 1478-2956 PT Time Calculation (min) (ACUTE ONLY): 56 min  Charges:    $Therapeutic Activity: 53-67 mins PT General Charges $$ ACUTE PT VISIT: 1 Visit                   Danielle Dess, PTA 04/13/23, 2:57 PM

## 2023-04-14 DIAGNOSIS — R29898 Other symptoms and signs involving the musculoskeletal system: Secondary | ICD-10-CM | POA: Insufficient documentation

## 2023-04-14 DIAGNOSIS — M8608 Acute hematogenous osteomyelitis, other sites: Secondary | ICD-10-CM | POA: Diagnosis not present

## 2023-04-14 DIAGNOSIS — K6812 Psoas muscle abscess: Secondary | ICD-10-CM | POA: Diagnosis not present

## 2023-04-14 DIAGNOSIS — M4856XA Collapsed vertebra, not elsewhere classified, lumbar region, initial encounter for fracture: Secondary | ICD-10-CM | POA: Insufficient documentation

## 2023-04-14 DIAGNOSIS — G061 Intraspinal abscess and granuloma: Secondary | ICD-10-CM | POA: Diagnosis not present

## 2023-04-14 DIAGNOSIS — A4101 Sepsis due to Methicillin susceptible Staphylococcus aureus: Secondary | ICD-10-CM | POA: Diagnosis not present

## 2023-04-14 LAB — CBC
HCT: 23.6 % — ABNORMAL LOW (ref 39.0–52.0)
Hemoglobin: 7.5 g/dL — ABNORMAL LOW (ref 13.0–17.0)
MCH: 28.5 pg (ref 26.0–34.0)
MCHC: 31.8 g/dL (ref 30.0–36.0)
MCV: 89.7 fL (ref 80.0–100.0)
Platelets: 152 10*3/uL (ref 150–400)
RBC: 2.63 MIL/uL — ABNORMAL LOW (ref 4.22–5.81)
RDW: 21.2 % — ABNORMAL HIGH (ref 11.5–15.5)
WBC: 4.1 10*3/uL (ref 4.0–10.5)
nRBC: 0 % (ref 0.0–0.2)

## 2023-04-14 LAB — BASIC METABOLIC PANEL
Anion gap: 9 (ref 5–15)
BUN: 6 mg/dL (ref 6–20)
CO2: 24 mmol/L (ref 22–32)
Calcium: 8.6 mg/dL — ABNORMAL LOW (ref 8.9–10.3)
Chloride: 105 mmol/L (ref 98–111)
Creatinine, Ser: 0.44 mg/dL — ABNORMAL LOW (ref 0.61–1.24)
GFR, Estimated: >60 mL/min (ref >60)
Glucose, Bld: 107 mg/dL — ABNORMAL HIGH (ref 70–99)
Potassium: 3.8 mmol/L (ref 3.5–5.1)
Sodium: 138 mmol/L (ref 135–145)

## 2023-04-14 MED ORDER — HYDROMORPHONE HCL 1 MG/ML IJ SOLN
1.0000 mg | INTRAMUSCULAR | Status: DC | PRN
  Administered 2023-04-14 – 2023-04-17 (×6): 1 mg via INTRAVENOUS
  Filled 2023-04-14 (×7): qty 1

## 2023-04-14 MED ORDER — SODIUM CHLORIDE 0.9 % IV SOLN
300.0000 mg | Freq: Once | INTRAVENOUS | Status: AC
  Administered 2023-04-14: 300 mg via INTRAVENOUS
  Filled 2023-04-14: qty 300

## 2023-04-14 NOTE — Plan of Care (Signed)
  Problem: Education: Goal: Knowledge of General Education information will improve Description: Including pain rating scale, medication(s)/side effects and non-pharmacologic comfort measures Outcome: Progressing   Problem: Health Behavior/Discharge Planning: Goal: Ability to manage health-related needs will improve 04/14/2023 1654 by Kamerin Grumbine P, RN Outcome: Progressing 04/14/2023 1653 by Sharol Given, RN Outcome: Not Progressing   Problem: Clinical Measurements: Goal: Ability to maintain clinical measurements within normal limits will improve 04/14/2023 1654 by Jax Abdelrahman P, RN Outcome: Progressing 04/14/2023 1653 by Sharol Given, RN Outcome: Not Progressing Goal: Will remain free from infection 04/14/2023 1654 by Ayleen Mckinstry P, RN Outcome: Progressing 04/14/2023 1653 by Sharol Given, RN Outcome: Not Progressing Goal: Diagnostic test results will improve 04/14/2023 1654 by Kensington Rios P, RN Outcome: Progressing 04/14/2023 1653 by Sharol Given, RN Outcome: Not Progressing Goal: Respiratory complications will improve 04/14/2023 1654 by Carliss Quast P, RN Outcome: Progressing 04/14/2023 1653 by Sharol Given, RN Outcome: Not Progressing Goal: Cardiovascular complication will be avoided 04/14/2023 1654 by Serigne Kubicek P, RN Outcome: Progressing 04/14/2023 1653 by Sharol Given, RN Outcome: Not Progressing   Problem: Activity: Goal: Risk for activity intolerance will decrease 04/14/2023 1654 by Roquel Burgin P, RN Outcome: Progressing 04/14/2023 1653 by Sharol Given, RN Outcome: Not Progressing   Problem: Nutrition: Goal: Adequate nutrition will be maintained 04/14/2023 1654 by Breeona Waid P, RN Outcome: Progressing 04/14/2023 1653 by Randal Buba P, RN Outcome: Not Progressing   Problem: Coping: Goal: Level of anxiety will decrease 04/14/2023 1654 by Kelden Lavallee P, RN Outcome: Progressing 04/14/2023 1653 by  Randal Buba P, RN Outcome: Not Progressing   Problem: Elimination: Goal: Will not experience complications related to bowel motility 04/14/2023 1654 by Joeli Fenner P, RN Outcome: Progressing 04/14/2023 1653 by Sharol Given, RN Outcome: Not Progressing Goal: Will not experience complications related to urinary retention 04/14/2023 1654 by Solace Wendorff P, RN Outcome: Progressing 04/14/2023 1653 by Sharol Given, RN Outcome: Not Progressing   Problem: Pain Managment: Goal: General experience of comfort will improve 04/14/2023 1654 by Rohnan Bartleson P, RN Outcome: Progressing 04/14/2023 1653 by Sharol Given, RN Outcome: Not Progressing   Problem: Safety: Goal: Ability to remain free from injury will improve 04/14/2023 1654 by Ko Bardon P, RN Outcome: Progressing 04/14/2023 1653 by Sharol Given, RN Outcome: Not Progressing   Problem: Skin Integrity: Goal: Risk for impaired skin integrity will decrease 04/14/2023 1654 by Essie Gehret P, RN Outcome: Progressing 04/14/2023 1653 by Sharol Given, RN Outcome: Not Progressing   Problem: Education: Goal: Knowledge of General Education information will improve Description: Including pain rating scale, medication(s)/side effects and non-pharmacologic comfort measures 04/14/2023 1654 by Sharol Given, RN Outcome: Progressing 04/14/2023 1653 by Sharol Given, RN Outcome: Not Progressing   Problem: Health Behavior/Discharge Planning: Goal: Ability to manage health-related needs will improve 04/14/2023 1654 by Karuna Balducci P, RN Outcome: Progressing 04/14/2023 1653 by Sharol Given, RN Outcome: Not Progressing   Problem: Activity: Goal: Risk for activity intolerance will decrease 04/14/2023 1654 by Kepler Mccabe P, RN Outcome: Progressing 04/14/2023 1653 by Sharol Given, RN Outcome: Not Progressing

## 2023-04-14 NOTE — Op Note (Signed)
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  He developed acute weakness and was taken to the OR for laminectomy and decompression of his lumbar canal.  He was doing well with improving motor strength, however had acute onset of severe pain and worsening weakness in his right lower extremity specifically.  Repeat imaging demonstrated collapse of his spinal column causing severe foraminal stenosis.  Given his progressive weakness and pain in the setting of discitis osteomyelitis was taken to the operating room for further decompression and fusion.  Findings: Severe compression of the exiting nerve root at the collapsed level, well decompressed at the end of the facetectomy.  Preoperative Diagnosis:  Epidural abscess, lumbar Back pain Right lower extremity weakness Pathologic compression fracture of the lumbar vertebrae, secondary to discitis osteomyelitis Discitis osteomyelitis  Postoperative Diagnosis: same   EBL: 1800 ml IVF: See anesthesia report Drains: Subfascial JP drain, wound VAC Disposition: Extubated and Stable to PACU Complications: none  A foley catheter was placed.   Preoperative Note: Adam Cabrera is a 37 year old man who had a recent lumbar laminectomy for epidural abscess with deficit.  He was recovering well and then developed severe pain and worsening right lower extremity weakness.  Was found to have a pathologic compression fracture with worsening of his spinal stenosis due to the kyphosis and foraminal collapse.  Given the severe compression of the exiting L2 nerve root a facetectomy was planned, anterior and posterior arthrodesis were planned as well given the pathologic compression fracture and kyphosis at L2-3, and due to the minimal pedicle structure at the involved levels we extended 1 above and 1 below.  Risks of surgery discussed include: infection, bleeding, stroke, coma, death, paralysis, CSF leak, nerve/spinal cord injury, numbness,  tingling, weakness, complex regional pain syndrome, recurrent stenosis and/or disc herniation, vascular injury, development of instability, neck/back pain, need for further surgery, persistent symptoms, development of deformity, and the risks of anesthesia. The patient understood these risks and agreed to proceed.  Operative Note:  1. Transforaminal Lumbar Interbody Fusion L2-3 2. Posterolateral arthrodesis L1 to L4 3. Posterior segmental instrumentation L1 to L4  4.  Lumbar decompression with total facetectomy on the right at L2-3 5.  Allograft placement in the 2 3 disc space for arthrodesis 6. Use of stereotaxis   The patient was brought to the Operating Room, intubated and turned into the prone position. All pressure points were checked and double checked. Flouroscopy was used to mark the incision. The patient was prepped and draped in the standard fashion. A full timeout was performed. Preoperative antibiotics were given. The incision was injected with local anesthetic.  The incision was opened with a scalpel, then the soft tissues divided with the Bovie. Self-retaining retractors were placed. The paraspinus muscles were reflected laterally in subperiosteal fashion until the transverse processes were visible. Flouroscopy was used to confirm our localization.  The stereotactic array was placed.  Stereotactic images were acquired and registered to the patient.  We then used stereotactically guided drill guides to cannulate the pedicles bilaterally from L1 to L4.   The pedicle screws were placed.  Specific screws are listed below.  After placement of pedicle screws, we then turned our attention to the transforaminal decompression and interbody fusion. The right L 2/3 facet was removed with the high-speed drill  and given the active infection these were discarded. The traversing and exiting nerve roots on the right were identified and protected. The disc was opened secondary to infectious  destruction.  After  entering the disc space rongeurs and rasps were used to remove infected disc material.  We removed as much as we were able to access leaving a defect.  While protecting the nerve roots, allograft material was placed into the eroded disc space.  This was mixed with antibiotic powder, vancomycin specifically  After placement of the anterior allograft we planned for placement of the rod construct.  Rods were measured to length and shaped. The rods were secured using locking caps to manufacturer's specifications. Final AP and lateral radiographs were taken to confirm placement of instrumentation and appropriate alignment. The wound was copiously irrigated, then the external surfaces of the remaining lamina, facet, and transverse processes from L1 to L4 were decorticated. A mixture of allograft and antibiotic powder was placed over the decorticated surfaces for arthrodesis.  A drain was placed subfascially.   After hemostasis, the wound was closed in layers with 0 and 2-0 vicryl.  V-Loc 2 was utilized.  A sterile wound VAC was placed.  The patient was then flipped supine and extubated with incident. All counts were correct times 2 at the end of the case. No immediate complications were noted.  Manning Charity PA assisted in the entire procedure. An assistant was required for this procedure due to the complexity.  The assistant provided assistance in tissue manipulation and suction, and was required for the successful and safe performance of the procedure. I performed the critical portions of the procedure.   Lovenia Kim, MD  Implant Name Type Inv. Item Serial No. Manufacturer Lot No. LRB No. Used Action  ALLOGRAFT BONE FIBER KORE 10CC - [id number] Bone Implant ALLOGRAFT BONE FIBER KORE 10CC [id number] MUSCULOSKELETL TRANSPLANT FNDN  N/A 1 Implanted  SCREW RELINE-O 6.5X55 POLY - [id number] Screw SCREW RELINE-O 6.5X55 POLY  [employer]  N/A 2 Implanted  SCREW  RELINE-O POLY 6.5X50MM - [id number] Screw SCREW RELINE-O POLY 6.5X50MM  [employer]  N/A 4 Implanted  SCREW LOCK RELINE 5.5 [place] - [id number] Screw SCREW LOCK RELINE 5.5 TULIP  [employer]  N/A 6 Implanted  ROD RELINE-O 5.5X100 LORD - [id number] Rod ROD RELINE-O 5.5X100 LORD  [employer]  N/A 2 Implanted  BONE [facility] CHIPS [facility] PCAN1/4 - [id number] Bone Implant BONE [facility] CHIPS [facility] PCAN1/4 [id number] [facility]  N/A 1 Implanted

## 2023-04-14 NOTE — Progress Notes (Signed)
Physical Therapy Treatment Patient Details Name: Adam Cabrera MRN: [id number] DOB: [birth year] Today's Date: 04/14/2023   History of Present Illness 37 y.o. male with medical history significant for hypertension, scrotal cellulitis, history of IV drug use, presents with low back pain.  MRI of the lumbar spine showed Epidural extension with epidural phlegmon/abscess within the ventral epidural space extending from L1-2 through L3-4 and s/p lumbar laminectomy; Pt is now s/p L1-4 PSF with right L3 facetectomy 04/10/23.    PT Comments  Pt ready for session.  Pain meds given prior.  He is able to get to EOB today with rail, HOB elevated and supervision.  Generally steady in sitting despite pain.  He agrees to TLSO today as gait is planned outside of room.  He does have a raised area over incision that is quite tender so brace is left loose as it does increase his pain with touch.  OT arrives for overlap session to allow for increased mobility and pt is able to stand with min a x 2 from elevated bed.  He is able to walk x 1 lap today around unit with heavy reliance on RW but good step through gait.  He does struggle upon return to room with increased pain and spasm and pauses at end of bed before making it back to his bed.  OT remained in room for continued session.  Pt with improved mobility today and activity tolerance but pain remains primary barrier.   If plan is discharge home, recommend the following: Two people to help with walking and/or transfers;Two people to help with bathing/dressing/bathroom;Assistance with cooking/housework;Assist for transportation;Help with stairs or ramp for entrance   Can travel by private vehicle        Equipment Recommendations  Rolling walker (2 wheels);Wheelchair (measurements PT);Wheelchair cushion (measurements PT)    Recommendations for Other Services       Precautions / Restrictions Precautions Precautions: Back;Fall Precaution Comments: Hemovac; wound  vac Required Braces or Orthoses: Spinal Brace Spinal Brace: Thoracolumbosacral orthotic Spinal Brace Comments: Can donn TLSO at edge of bed; per PA Alycia Rossetti 04/11/23 "He can go to the bathroom and ambulate around the room without it but if he's going to do much more than that he needs to have it on" Restrictions Weight Bearing Restrictions: No     Mobility  Bed Mobility Overal bed mobility: Needs Assistance   Rolling: Supervision, Used rails Sidelying to sit: Used rails, Contact guard assist       General bed mobility comments: set up assist, rails and HOB raised    Transfers Overall transfer level: Needs assistance Equipment used: Rolling walker (2 wheels) Transfers: Sit to/from Stand Sit to Stand: Min assist, +2 physical assistance, From elevated surface                Ambulation/Gait Ambulation/Gait assistance: Min assist, +2 physical assistance Gait Distance (Feet): 160 Feet Assistive device: Rolling walker (2 wheels) Gait Pattern/deviations: Wide base of support, Step-through pattern, Trunk flexed Gait velocity: decreased     General Gait Details: heavy lean on walker but overall does well today.  struggles some upon return to room due to increased pain   Stairs             Wheelchair Mobility     Tilt Bed    Modified Rankin (Stroke Patients Only)       Balance Overall balance assessment: Needs assistance Sitting-balance support: Bilateral upper extremity supported, Feet supported Sitting balance-Leahy Scale: Fair  Standing balance support: Bilateral upper extremity supported, During functional activity, Reliant on assistive device for balance Standing balance-Leahy Scale: Fair                              Cognition Arousal: Alert Behavior During Therapy: WFL for tasks assessed/performed Overall Cognitive Status: Within Functional Limits for tasks assessed                                          Exercises       General Comments        Pertinent Vitals/Pain Pain Assessment Pain Assessment: Faces Faces Pain Scale: Hurts whole lot Pain Location: Low back Pain Descriptors / Indicators: Discomfort, Grimacing, Guarding, Moaning, Tingling Pain Intervention(s): Premedicated before session, Repositioned, Limited activity within patient's tolerance, Monitored during session    Home Living                          Prior Function            PT Goals (current goals can now be found in the care plan section) Progress towards PT goals: Progressing toward goals    Frequency    Min 1X/week      PT Plan      Co-evaluation              AM-PAC PT "6 Clicks" Mobility   Outcome Measure  Help needed turning from your back to your side while in a flat bed without using bedrails?: A Little Help needed moving from lying on your back to sitting on the side of a flat bed without using bedrails?: A Little Help needed moving to and from a bed to a chair (including a wheelchair)?: A Little Help needed standing up from a chair using your arms (e.g., wheelchair or bedside chair)?: A Little Help needed to walk in hospital room?: A Little Help needed climbing 3-5 steps with a railing? : Total 6 Click Score: 16    End of Session Equipment Utilized During Treatment: Gait belt;Back brace Activity Tolerance: Patient limited by pain Patient left: in bed;with call bell/phone within reach;with bed alarm set;Other (comment) Nurse Communication: Mobility status;Precautions PT Visit Diagnosis: Pain;Other abnormalities of gait and mobility (R26.89);Muscle weakness (generalized) (M62.81) Pain - Right/Left: Right     Time: 8469-6295 PT Time Calculation (min) (ACUTE ONLY): 19 min  Charges:    $Gait Training: 8-22 mins PT General Charges $$ ACUTE PT VISIT: 1 Visit                   Danielle Dess, PTA 04/14/23, 11:38 AM

## 2023-04-14 NOTE — Progress Notes (Signed)
Progress Note   Patient: Adam Cabrera ZOX:[id number] DOB: 1986-05-10 DOA: 03/23/2023     22 DOS: the patient was seen and examined on 04/14/2023   Brief hospital course: 37 y.o. male with medical history significant for hypertension, scrotal cellulitis, history of IV drug use, presents with low back pain.  MRI of the lumbar spine showed Epidural extension with epidural phlegmon/abscess within the ventral epidural space extending from L1-2 through L3-4.  Blood culture positive for Staph aureus, patient treated with cefepime and vancomycin.  Patient has been seen by ID and neurosurgery, L spine washout performed 8/19. Patient condition had improved up to 8/26, wound VAC removed.  Patient will need 6 weeks of IV antibiotics.  Per TOC, there is no placement option.  Patient currently on on continuous nafcillin infusion.  Patient had a worsening back pain since 9/3, CT and MRI of the lumbar spine were repeated,Showed persistent findings of osteomyelitis discitis at L2-3 with mildly progressive vertebral body height loss and endplate erosion since previous. Persistent epidural involvement with ventral epidural phlegmon/abscess, persistent fairly severe canal and bilateral subarticular stenosis,bilateral psoas abscesses.  Patient was brought to the OR again 9/5.     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 Status post neurosurgical procedure for L2-L3 decompression for epidural phlegmon on 8/19.  Patient on continuous nafcillin.  ID recommends 6 weeks total of IV antibiotics with nafcillin. Present on admission with tachycardia, tachypnea and epidural abscess.    Patient had a worsening back pain after MRI and CT scan, surgery performed 9/5 for repeat washout, right L2-3 facetectomy, L1-4 posterior spinal fusion, L2-3 transforaminal arthrodesis with bone graft, and posterior Lateral arthrodesis.    Postoperatively, patient had significant back pain.  Still has significant bloody drain from surgical site.  Continue oral pain medicine, reduce Dilaudid to every 4 hours as needed.   Thrombocytopenia (HCC) Acute blood loss anemia.  Fresh blood passed per rectum resolved Iron deficiency anemia Bleeding likely secondary to hemorrhoids.  Seen by GI.  Anusol suppositories.   Patient hemoglobin dropped down to 7.9 on 9/7.  Received IV iron. Hemoglobin again dropped down to 7.5 on 9/9, with continued bloody drain from wound VAC, patient still oozing blood.  Will give IV iron again.     Hyponatremia Hypokalemia. Improved   Obesity (BMI 30-39.9) BMI 31.42   Headache Improved.        Subjective:  Still complaining of severe back pain.  Worse with any movement. Had a large bowel movement yesterday.  Physical Exam: Vitals:   04/13/23 0816 04/13/23 1540 04/13/23 2343 04/14/23 1009  BP: 128/87 121/76 124/81 121/79  Pulse: 90 90 83 98  Resp: 16 14 18 18   Temp: 98.1 F (36.7 C) 97.9 F (36.6 C) 97.9 F (36.6 C) 98.5 F (36.9 C)  TempSrc:      SpO2: 100% 99% 100% 100%  Weight:      Height:       General exam: Appears calm and comfortable  Respiratory system: Clear to auscultation. Respiratory effort normal. Cardiovascular system: S1 & S2 heard, RRR. No JVD, murmurs, rubs, gallops or clicks. No pedal edema. Gastrointestinal system: Abdomen is nondistended, soft and  nontender. No organomegaly or masses felt. Normal bowel sounds heard. Central nervous system: Alert and oriented. No focal neurological deficits. Extremities: Symmetric 5 x 5 power. Skin: No rashes, lesions or ulcers Psychiatry: Judgement and insight appear normal. Mood & affect appropriate.    Data Reviewed:  Lab results reviewed.  Family Communication: None  Disposition: Status is: Inpatient Remains inpatient appropriate because: Severity of disease, IV treatment.     Time spent: 35 minutes  Author: Marrion Coy, MD 04/14/2023 1:26 PM  For on call review [email or web address].

## 2023-04-14 NOTE — Progress Notes (Signed)
Occupational Therapy Treatment Patient Details Name: Adam Cabrera MRN: [id number] DOB: [birth year] Today's Date: 04/14/2023   History of present illness 37 y.o. male with medical history significant for hypertension, scrotal cellulitis, history of IV drug use, presents with low back pain.  MRI of the lumbar spine showed Epidural extension with epidural phlegmon/abscess within the ventral epidural space extending from L1-2 through L3-4 and s/p lumbar laminectomy; Pt is now s/p L1-4 PSF with right L3 facetectomy 04/10/23.   OT comments  Chart reviewed to date, pt greeted in room in care of PTA, agreeable to OT/PT co tx. Tx session targeted improving functional activity tolerance for ADL tasks. TLSO donned at edge of bed and worn throughout tx session. Improvements noted in bed mobility requiring MOD-MAX A for sit>sidelying, supervision for rolling with use of bed rails. Pt amb in hallway with RW with MIN A, +2 and chair follow. Heavy support through RW. Pt tolerated unsupported grooming tasks seated on the edge of bed with supervision. Pt continues to be limited by pain. Pt is making progress towards goals, OT will continue to follow acutely.       If plan is discharge home, recommend the following:  Two people to help with walking and/or transfers;Two people to help with bathing/dressing/bathroom;Assistance with cooking/housework;Assist for transportation;Help with stairs or ramp for entrance   Equipment Recommendations  BSC/3in1;Tub/shower seat    Recommendations for Other Services      Precautions / Restrictions Precautions Precautions: Back;Fall Precaution Comments: Hemovac; wound vac Required Braces or Orthoses: Spinal Brace Spinal Brace: Thoracolumbosacral orthotic Spinal Brace Comments: Can donn TLSO at edge of bed; per PA Alycia Rossetti 04/11/23 "He can go to the bathroom and ambulate around the room without it but if he's going to do much more than that he needs to have it on" Restrictions Weight  Bearing Restrictions: No       Mobility Bed Mobility Overal bed mobility: Needs Assistance Bed Mobility: Rolling, Sit to Sidelying Rolling: Supervision, Used rails (with increased time)       Sit to sidelying: Mod assist, Max assist General bed mobility comments: for managment of BLE    Transfers Overall transfer level: Needs assistance Equipment used: Rolling walker (2 wheels) Transfers: Sit to/from Stand Sit to Stand: Min assist, +2 physical assistance, From elevated surface                 Balance Overall balance assessment: Needs assistance Sitting-balance support: Bilateral upper extremity supported, Feet supported Sitting balance-Leahy Scale: Fair     Standing balance support: Bilateral upper extremity supported, During functional activity, Reliant on assistive device for balance Standing balance-Leahy Scale: Fair                             ADL either performed or assessed with clinical judgement   ADL Overall ADL's : Needs assistance/impaired     Grooming: Sitting;Oral care;Supervision/safety Grooming Details (indicate cue type and reason): on edge of bed to brush teeth         Upper Body Dressing : Maximal assistance Upper Body Dressing Details (indicate cue type and reason): TLSO     Toilet Transfer: Minimal assistance;Rolling walker (2 wheels);+2 for physical assistance;+2 for safety/equipment           Functional mobility during ADLs: Rolling walker (2 wheels);+2 for physical assistance;+2 for safety/equipment;Contact guard assist;Minimal assistance (approx 160' with RW, +2, chair follow)        Cognition Arousal:  Alert Behavior During Therapy: WFL for tasks assessed/performed Overall Cognitive Status: Within Functional Limits for tasks assessed                                                General Comments wound vac and hemovac intact pre/post session, IV intact pre/post session    Pertinent Vitals/  Pain       Pain Assessment Pain Assessment: 0-10 Pain Score: 10-Worst pain ever Pain Location: back Pain Descriptors / Indicators: Discomfort, Grimacing, Guarding, Moaning Pain Intervention(s): Limited activity within patient's tolerance, Repositioned, Monitored during session, Patient requesting pain meds-RN notified   Frequency  Min 1X/week        Progress Toward Goals  OT Goals(current goals can now be found in the care plan section)  Progress towards OT goals: Progressing toward goals     Plan      Co-evaluation    PT/OT/SLP Co-Evaluation/Treatment: Yes Reason for Co-Treatment: Complexity of the patient's impairments (multi-system involvement);For patient/therapist safety;To address functional/ADL transfers   OT goals addressed during session: ADL's and self-care      AM-PAC OT "6 Clicks" Daily Activity     Outcome Measure   Help from another person eating meals?: None Help from another person taking care of personal grooming?: A Little Help from another person toileting, which includes using toliet, bedpan, or urinal?: A Lot Help from another person bathing (including washing, rinsing, drying)?: A Lot Help from another person to put on and taking off regular upper body clothing?: A Lot Help from another person to put on and taking off regular lower body clothing?: A Lot 6 Click Score: 15    End of Session Equipment Utilized During Treatment: Rolling walker (2 wheels);Gait belt;Other (comment) (TLSO)  OT Visit Diagnosis: Unsteadiness on feet (R26.81);Muscle weakness (generalized) (M62.81)   Activity Tolerance Patient limited by pain   Patient Left in bed;with call bell/phone within reach;with bed alarm set   Nurse Communication Mobility status;Patient requests pain meds        Time: 1119-1140 OT Time Calculation (min): 21 min  Charges: OT General Charges $OT Visit: 1 Visit OT Treatments $Therapeutic Activity: 8-22 mins  Oleta Mouse, OTD OTR/L   04/14/23, 11:56 AM

## 2023-04-15 DIAGNOSIS — A4101 Sepsis due to Methicillin susceptible Staphylococcus aureus: Secondary | ICD-10-CM | POA: Diagnosis not present

## 2023-04-15 DIAGNOSIS — G061 Intraspinal abscess and granuloma: Secondary | ICD-10-CM | POA: Diagnosis not present

## 2023-04-15 DIAGNOSIS — M8608 Acute hematogenous osteomyelitis, other sites: Secondary | ICD-10-CM | POA: Diagnosis not present

## 2023-04-15 LAB — HEPATIC FUNCTION PANEL
ALT: 12 U/L (ref 0–44)
AST: 17 U/L (ref 15–41)
Albumin: 2.8 g/dL — ABNORMAL LOW (ref 3.5–5.0)
Alkaline Phosphatase: 42 U/L (ref 38–126)
Bilirubin, Direct: 0.2 mg/dL (ref 0.0–0.2)
Indirect Bilirubin: 1.3 mg/dL — ABNORMAL HIGH (ref 0.3–0.9)
Total Bilirubin: 1.5 mg/dL — ABNORMAL HIGH (ref 0.3–1.2)
Total Protein: 6.1 g/dL — ABNORMAL LOW (ref 6.5–8.1)

## 2023-04-15 LAB — CBC
HCT: 27.3 % — ABNORMAL LOW (ref 39.0–52.0)
Hemoglobin: 8.4 g/dL — ABNORMAL LOW (ref 13.0–17.0)
MCH: 28.2 pg (ref 26.0–34.0)
MCHC: 30.8 g/dL (ref 30.0–36.0)
MCV: 91.6 fL (ref 80.0–100.0)
Platelets: 160 10*3/uL (ref 150–400)
RBC: 2.98 MIL/uL — ABNORMAL LOW (ref 4.22–5.81)
RDW: 21.4 % — ABNORMAL HIGH (ref 11.5–15.5)
WBC: 4 10*3/uL (ref 4.0–10.5)
nRBC: 0.5 % — ABNORMAL HIGH (ref 0.0–0.2)

## 2023-04-15 NOTE — Progress Notes (Cosign Needed Addendum)
   Neurosurgery Progress Note  History: Adam Cabrera is s/p L2-3 decompression for epidural phlegmon.  POD22/5: Pt reports improved pain and right quad strength. He ambulated some with therapy yesterday POD18/1: Patient urinating severe back pain.  Reports improvement of his radiating leg pain this morning. POD17: Continue back and R>L posterior thigh pain.  POD16: Patient reports an increase of low back pain and right posterior radiating leg pain yesterday after physical therapy which has persisted today.  He has had worsening right leg weakness over the last 24 hours. POD4: Improved pain this morning POD3: NAEO POD2: continued back pain. Improved with some medication changes.  POD1: improved leg pain and strength overnight.  Physical Exam: Vitals:   04/14/23 1647 04/15/23 0031  BP: 115/79 124/78  Pulse: 97 87  Resp: 18 18  Temp: 98.5 F (36.9 C) 98.2 F (36.8 C)  SpO2: 100% 98%    AA Ox3  CNI  Strength:5/5 throughout BLE except 4/5 right KE  Data:  Other tests/results:  Cultures positive for abundant Staph aureus.  Sensitivities pending. Gram stain showing gram positive cocci  MRI L spine 04/09/23  IMPRESSION: 1. Persistent findings of osteomyelitis discitis at L2-3 with mildly progressive vertebral body height loss and endplate erosion since previous. Persistent epidural involvement with ventral epidural phlegmon/abscess as above. 2. Sequelae of interval decompressive laminectomy at L2-3. Persistent fairly severe canal and bilateral subarticular stenosis at this level, although overall appears somewhat improved as compared to preoperative exam from 03/23/2023. 3. Associated paraspinous inflammatory changes with bilateral psoas abscesses as above. 4. No other new or progressive findings elsewhere within the lumbar spine.     Electronically Signed   By: Rise Mu M.D.   On: 04/10/2023 03:07  Assessment/Plan:  Adam Cabrera is a 37 year old  presenting with an L2-3 abscess and right greater than left lower extremity weakness status post L2-3 decompression for evacuation of epidural phlegmon.  Unfortunately has had a worsening of symptoms and increased pain overnight prompting a CT lumbar spine showing worsening collapse at L2-3 and L3-4. S/p L1-4 PSF with right L3 facetectomy.  - pain control - DVT prophylaxis - TLSO brace when OOB and ambulating.  Okay to work with therapy until brace is delivered. - HV and wound vac removed. - will continue to follow peripherally. Please call with any questions or concerns.  Manning Charity PA-C Department of Neurosurgery

## 2023-04-15 NOTE — Progress Notes (Signed)
PT Cancellation Note  Patient Details Name: Adam Cabrera MRN: [id number] DOB: [birth year]   Cancelled Treatment:     Pt had surgery late yesterday evening. Communicated with care team via secure chat for update in PT orders to re-evaluate post surgical procedure.   Ladamien Rammel 04/15/2023, 9:05 AM

## 2023-04-15 NOTE — Progress Notes (Signed)
Progress Note   Patient: Adam Cabrera ZOX:[id number] DOB: 03/02/1986 DOA: 03/23/2023     23 DOS: the patient was seen and examined on 04/15/2023   Brief hospital course: 37 y.o. male with medical history significant for hypertension, scrotal cellulitis, history of IV drug use, presents with low back pain.  MRI of the lumbar spine showed Epidural extension with epidural phlegmon/abscess within the ventral epidural space extending from L1-2 through L3-4.  Blood culture positive for Staph aureus, patient treated with cefepime and vancomycin.  Patient has been seen by ID and neurosurgery, L spine washout performed 8/19. Patient condition had improved up to 8/26, wound VAC removed.  Patient will need 6 weeks of IV antibiotics.  Per TOC, there is no placement option.  Patient currently on on continuous nafcillin infusion.  Patient had a worsening back pain since 9/3, Patient was brought to the OR again 9/5. Had: 1. Transforaminal Lumbar Interbody Fusion L2-3 2. Posterolateral arthrodesis L1 to L4 3. Posterior segmental instrumentation L1 to L4  4.  Lumbar decompression with total facetectomy on the right at L2-3 5.  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 Status post neurosurgical procedure for L2-L3 decompression for epidural phlegmon on 8/19.  Patient on continuous nafcillin.  ID recommends 6 weeks total of IV antibiotics with nafcillin. Present on admission with tachycardia, tachypnea and epidural abscess.    Patient had a worsening back pain after MRI and CT scan, surgery performed 9/5 for repeat washout, right L2-3 facetectomy, L1-4 posterior spinal fusion, L2-3 transforaminal arthrodesis with bone graft, and posterior Lateral arthrodesis.    Postoperatively, patient condition gradually improving.  Followed by neurosurgery, wound VAC was removed today. Continue PT/OT.  Continue IV antibiotics.   Thrombocytopenia (HCC) Acute blood loss anemia.  Fresh blood passed per rectum resolved Iron deficiency anemia Bleeding likely secondary to hemorrhoids.  Seen by GI.  Anusol suppositories.   Patient hemoglobin dropped down to 7.9 on 9/7.  Received IV iron. Hemoglobin again dropped down to 7.5 on 9/9, with continued bloody drain from wound VAC, patient still oozing blood.  Received IV iron again. Hemoglobin much better today.     Hyponatremia Hypokalemia. Improved   Obesity (BMI 30-39.9) BMI 31.42   Headache Improved.      Subjective:  Patient still complaining significant back pain, but gradually getting better. No leg weakness. Had a bowel movement yesterday.  Physical Exam: Vitals:   04/13/23 2343 04/14/23 1009 04/14/23 1647 04/15/23 0031  BP: 124/81 121/79 115/79 124/78  Pulse: 83 98 97 87  Resp: 18 18 18 18   Temp: 97.9 F (36.6 C) 98.5 F (36.9 C) 98.5 F (36.9 C) 98.2 F (36.8 C)  TempSrc:  SpO2: 100% 100% 100% 98%  Weight:      Height:       General exam: Appears calm and comfortable  Respiratory system: Clear to auscultation. Respiratory effort normal. Cardiovascular system: S1 & S2 heard, RRR. No JVD, murmurs, rubs, gallops or clicks. No pedal edema. Gastrointestinal  system: Abdomen is nondistended, soft and nontender. No organomegaly or masses felt. Normal bowel sounds heard. Central nervous system: Alert and oriented. No focal neurological deficits. Extremities: Symmetric 5 x 5 power. Skin: No rashes, lesions or ulcers Psychiatry: Judgement and insight appear normal. Mood & affect appropriate.    Data Reviewed:  Lab results reviewed.  Family Communication: None  Disposition: Status is: Inpatient Remains inpatient appropriate because: Severity of disease, IV treatment.  Unsafe discharge.     Time spent: 35 minutes  Author: Marrion Coy, MD 04/15/2023 1:20 PM  For on call review [email or web address].

## 2023-04-15 NOTE — Plan of Care (Signed)
  Problem: Nutrition: Goal: Adequate nutrition will be maintained Outcome: Progressing   Problem: Elimination: Goal: Will not experience complications related to bowel motility Outcome: Progressing Goal: Will not experience complications related to urinary retention Outcome: Progressing   

## 2023-04-15 NOTE — Progress Notes (Signed)
Physical Therapy Treatment Patient Details Name: Adam Cabrera MRN: [id number] DOB: Oct 10, 1985 Today's Date: 04/15/2023   History of Present Illness 37 y.o. male with medical history significant for hypertension, scrotal cellulitis, history of IV drug use, presents with low back pain.  MRI of the lumbar spine showed Epidural extension with epidural phlegmon/abscess within the ventral epidural space extending from L1-2 through L3-4 and s/p lumbar laminectomy; Pt is now s/p L1-4 PSF with right L3 facetectomy 04/10/23.    PT Comments  Pt progressing steadily towards PT goals. Pt is received in bed, he is agreeable to PT session. Pt performs bed mobility mod I-min A, transfers and amb min A x2. Pt reports 8/10 NPS LBP near incision site at beginning of session-monitored throughout session. Pt attempted and successfully rolled to L side today to sit EOB with no increase pain/discomfort. Pt reports slight increase discomfort when TLSO was applied at EOB but improved with time. Pt able to transfer and amb approx 215 ft min A x2 for IV pole management and chair follow towards end of amb distance for safety. R knee buckling observed near end of amb distance, although improved from prior visits. Unable to progress amb distance at this time secondary to increased fatigue and pain limitation. Overall, Pt appears to be making good progress towards PT goals and PLOF. Pt would benefit from skilled PT to address above deficits and promote optimal return to PLOF.    If plan is discharge home, recommend the following: Two people to help with walking and/or transfers;Two people to help with bathing/dressing/bathroom;Assistance with cooking/housework;Assist for transportation;Help with stairs or ramp for entrance   Can travel by private vehicle        Equipment Recommendations  Rolling walker (2 wheels);Wheelchair (measurements PT);Wheelchair cushion (measurements PT);Other (comment) (TBD at this time)     Recommendations for Other Services       Precautions / Restrictions Precautions Precautions: Back;Fall Required Braces or Orthoses: Spinal Brace Spinal Brace: Thoracolumbosacral orthotic Spinal Brace Comments: Can donn TLSO at edge of bed; per PA Alycia Rossetti 04/11/23 "He can go to the bathroom and ambulate around the room without it but if he's going to do much more than that he needs to have it on" Restrictions Weight Bearing Restrictions: No     Mobility  Bed Mobility Overal bed mobility: Needs Assistance Bed Mobility: Rolling, Sidelying to Sit, Sit to Sidelying Rolling: Supervision, Used rails, Modified independent (Device/Increase time) Sidelying to sit: Used rails, Contact guard assist, HOB elevated     Sit to sidelying: Min assist, HOB elevated, Used rails General bed mobility comments: min A for managment of BLE getting back into bed    Transfers Overall transfer level: Needs assistance Equipment used: Rolling walker (2 wheels) Transfers: Sit to/from Stand Sit to Stand: Min assist, +2 safety/equipment, From elevated surface           General transfer comment: Improved transfer assistance during session with no reports of increase pain/discomfort    Ambulation/Gait Ambulation/Gait assistance: Min assist, +2 safety/equipment Gait Distance (Feet): 215 Feet Assistive device: Rolling walker (2 wheels) Gait Pattern/deviations: Wide base of support, Step-through pattern, Trunk flexed, Decreased stride length Gait velocity: decreased     General Gait Details: Improved reliance on AD noted during amb activity and maintaining upright posture; cuing for maintaining AD closer to trunk; slight R knee buckling observed during end of amb distance but overall improved   Financial trader  Mobility     Tilt Bed    Modified Rankin (Stroke Patients Only)       Balance Overall balance assessment: Needs assistance Sitting-balance support: Bilateral upper  extremity supported, Feet supported, Single extremity supported Sitting balance-Leahy Scale: Good Sitting balance - Comments: Able to maintain seated EOB balance during donning of TLSO brace progressing to 1 UE support for stability but cont to prefer BUE support   Standing balance support: Bilateral upper extremity supported, During functional activity, Reliant on assistive device for balance Standing balance-Leahy Scale: Good Standing balance comment: Able to maintain static standing balance during functional activities with ability to maintain upright posture with min cuing                            Cognition Arousal: Alert Behavior During Therapy: WFL for tasks assessed/performed Overall Cognitive Status: Within Functional Limits for tasks assessed                                 General Comments: Agreeable to session but limited by pain        Exercises      General Comments General comments (skin integrity, edema, etc.): IV intact pre/post session      Pertinent Vitals/Pain Pain Assessment Pain Assessment: 0-10 Pain Score: 8  Pain Location: back Pain Descriptors / Indicators: Discomfort, Guarding, Operative site guarding, Sore Pain Intervention(s): Monitored during session, Premedicated before session, Limited activity within patient's tolerance    Home Living                          Prior Function            PT Goals (current goals can now be found in the care plan section) Acute Rehab PT Goals Patient Stated Goal: less pain PT Goal Formulation: With patient Time For Goal Achievement: 04/25/23 Potential to Achieve Goals: Fair Progress towards PT goals: Progressing toward goals    Frequency    Min 1X/week      PT Plan      Co-evaluation              AM-PAC PT "6 Clicks" Mobility   Outcome Measure  Help needed turning from your back to your side while in a flat bed without using bedrails?: A Little Help  needed moving from lying on your back to sitting on the side of a flat bed without using bedrails?: A Little Help needed moving to and from a bed to a chair (including a wheelchair)?: A Little Help needed standing up from a chair using your arms (e.g., wheelchair or bedside chair)?: A Little Help needed to walk in hospital room?: A Little Help needed climbing 3-5 steps with a railing? : Total 6 Click Score: 16    End of Session Equipment Utilized During Treatment: Gait belt;Back brace Activity Tolerance: Patient limited by pain Patient left: in bed;with call bell/phone within reach;with bed alarm set Nurse Communication: Mobility status;Other (comment) (Pt requesting dressing clean and overall back cleaning) PT Visit Diagnosis: Pain;Other abnormalities of gait and mobility (R26.89);Muscle weakness (generalized) (M62.81) Pain - Right/Left: Right (bilateral) Pain - part of body: Leg (back)     Time: 1610-9604 PT Time Calculation (min) (ACUTE ONLY): 21 min  Charges:  Elmon Else, SPT    Evonte Prestage 04/15/2023, 12:30 PM

## 2023-04-16 DIAGNOSIS — A419 Sepsis, unspecified organism: Secondary | ICD-10-CM | POA: Diagnosis not present

## 2023-04-16 DIAGNOSIS — M8600 Acute hematogenous osteomyelitis, unspecified site: Secondary | ICD-10-CM | POA: Diagnosis not present

## 2023-04-16 DIAGNOSIS — K6812 Psoas muscle abscess: Secondary | ICD-10-CM | POA: Diagnosis not present

## 2023-04-16 DIAGNOSIS — G061 Intraspinal abscess and granuloma: Secondary | ICD-10-CM | POA: Diagnosis not present

## 2023-04-16 LAB — AEROBIC/ANAEROBIC CULTURE W GRAM STAIN (SURGICAL/DEEP WOUND)
Culture: NO GROWTH
Culture: NO GROWTH
Culture: NO GROWTH
Gram Stain: NONE SEEN
Gram Stain: NONE SEEN
Gram Stain: NONE SEEN
Gram Stain: NONE SEEN

## 2023-04-16 MED ORDER — RIFAMPIN 300 MG PO CAPS
300.0000 mg | ORAL_CAPSULE | Freq: Two times a day (BID) | ORAL | Status: DC
  Administered 2023-04-16 – 2023-05-13 (×55): 300 mg via ORAL
  Filled 2023-04-16 (×57): qty 1

## 2023-04-16 MED ORDER — TERBINAFINE HCL 1 % EX CREA
TOPICAL_CREAM | Freq: Two times a day (BID) | CUTANEOUS | Status: DC
  Filled 2023-04-16 (×4): qty 12

## 2023-04-16 MED ORDER — TOLNAFTATE 1 % EX CREA: TOPICAL_CREAM | Freq: Two times a day (BID) | CUTANEOUS | Status: DC

## 2023-04-16 MED ORDER — HYDROCORTISONE ACETATE 25 MG RE SUPP
25.0000 mg | Freq: Three times a day (TID) | RECTAL | Status: DC
  Administered 2023-04-16 – 2023-04-18 (×7): 25 mg via RECTAL
  Filled 2023-04-16 (×18): qty 1

## 2023-04-16 NOTE — Plan of Care (Signed)
  Problem: Education: Goal: Knowledge of General Education information will improve Description: Including pain rating scale, medication(s)/side effects and non-pharmacologic comfort measures 04/16/2023 1753 by Danella Sensing, RN Outcome: Progressing 04/16/2023 1753 by Danella Sensing, RN Outcome: Not Progressing   Problem: Health Behavior/Discharge Planning: Goal: Ability to manage health-related needs will improve Outcome: Not Progressing   Problem: Activity: Goal: Risk for activity intolerance will decrease 04/16/2023 1753 by Danella Sensing, RN Outcome: Progressing 04/16/2023 1753 by Danella Sensing, RN Outcome: Not Progressing   Problem: Coping: Goal: Level of anxiety will decrease 04/16/2023 1753 by Danella Sensing, RN Outcome: Progressing 04/16/2023 1753 by Danella Sensing, RN Outcome: Not Progressing   Problem: Elimination: Goal: Will not experience complications related to bowel motility Outcome: Not Progressing Goal: Will not experience complications related to urinary retention Outcome: Not Progressing   Problem: Skin Integrity: Goal: Risk for impaired skin integrity will decrease Outcome: Not Progressing   Problem: Safety: Goal: Ability to remain free from injury will improve 04/16/2023 1753 by Danella Sensing, RN Outcome: Progressing 04/16/2023 1753 by Danella Sensing, RN Outcome: Not Progressing

## 2023-04-16 NOTE — Progress Notes (Signed)
ID PROGRESS NOTE  37yo M with lumbar epidural abscess s/p evacuation and washout on 8/19, then had to have instrumentation placed on 9/5 for spine instability. 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  -tolerating, day #22 of iv abtx- with nafcillin - will start rifampin 600mg  po in addition to nafcillin - this may increase metabolism of his pain medications to watch for..  Will see tomorrow/friday  Duke Salvia. Drue Second MD MPH [facility] [phone]

## 2023-04-16 NOTE — Progress Notes (Signed)
Physical Therapy Treatment Patient Details Name: Adam Cabrera MRN: [id number] DOB: Jun 09, 1986 Today's Date: 04/16/2023   History of Present Illness 37 y.o. male with medical history significant for hypertension, scrotal cellulitis, history of IV drug use, presents with low back pain.  MRI of the lumbar spine showed Epidural extension with epidural phlegmon/abscess within the ventral epidural space extending from L1-2 through L3-4 and s/p lumbar laminectomy; Pt is now s/p L1-4 PSF with right L3 facetectomy 04/10/23.    PT Comments  Pt is making good progress towards PT goals. Pt is received at EOB with OT and RN at bedside, he is agreeable to PT session. Pt performs bed mobility CGA-min A, transfers CGA, and amb min A. Pt able to amb approx 315 ft using RW and with TLSO donned with no reports of increased LBP/discomfort. Additionally, Pt able to perform min standing exercise to promote BLE strength prior to mobility. Cont to require min A for BLE management during return to supine position at end of session but otherwise mod I. Overall, Pt cont to make meaningful strides towards improvement of mobility and activity tolerance. Pain management cont to be a barrier but improving. Pt would benefit from cont skilled PT to address above deficits and promote optimal return to PLOF.    If plan is discharge home, recommend the following: Two people to help with walking and/or transfers;Two people to help with bathing/dressing/bathroom;Assistance with cooking/housework;Assist for transportation;Help with stairs or ramp for entrance   Can travel by private vehicle        Equipment Recommendations  Rolling walker (2 wheels);Wheelchair (measurements PT);Wheelchair cushion (measurements PT);Other (comment)    Recommendations for Other Services       Precautions / Restrictions Precautions Precautions: Back;Fall Precaution Booklet Issued: No Required Braces or Orthoses: Spinal Brace Spinal Brace:  Thoracolumbosacral orthotic Spinal Brace Comments: Can donn TLSO at edge of bed; per PA Alycia Rossetti 04/11/23 "He can go to the bathroom and ambulate around the room without it but if he's going to do much more than that he needs to have it on" Restrictions Weight Bearing Restrictions: No     Mobility  Bed Mobility Overal bed mobility: Needs Assistance Bed Mobility: Rolling, Sidelying to Sit, Sit to Sidelying Rolling: Supervision, Used rails, Modified independent (Device/Increase time) Sidelying to sit: Used rails, Contact guard assist, HOB elevated     Sit to sidelying: Min assist, HOB elevated, Used rails General bed mobility comments: Cont to require min A for BLE management when returning to bed secondary to weakness and LBP    Transfers Overall transfer level: Needs assistance Equipment used: Rolling walker (2 wheels) Transfers: Sit to/from Stand Sit to Stand: From elevated surface, Contact guard assist           General transfer comment: Able to demonstrate improved STS from elevated bed, not requiring VC for hand placement or assistance to complete task    Ambulation/Gait Ambulation/Gait assistance: Min assist Gait Distance (Feet): 315 Feet Assistive device: Rolling walker (2 wheels) Gait Pattern/deviations: Wide base of support, Step-through pattern, Trunk flexed, Decreased stride length Gait velocity: slightly decreased     General Gait Details: Improved reliance on AD noted during amb activity and maintaining upright posture; occasional cuing for maintaining AD closer to trunk   Stairs             Wheelchair Mobility     Tilt Bed    Modified Rankin (Stroke Patients Only)       Balance Overall balance assessment: Needs  assistance Sitting-balance support: Feet supported, Single extremity supported Sitting balance-Leahy Scale: Good Sitting balance - Comments: Able to maintain seated EOB balance during donning of TLSO brace with 1 UE support for stability    Standing balance support: Bilateral upper extremity supported, During functional activity, Reliant on assistive device for balance Standing balance-Leahy Scale: Good Standing balance comment: Able to maintain static standing balance during functional activities                            Cognition Arousal: Alert Behavior During Therapy: WFL for tasks assessed/performed Overall Cognitive Status: Within Functional Limits for tasks assessed                                 General Comments: Agreeable to session but limited by pain; slight flat affect which improved throughout session        Exercises Other Exercises Other Exercises: x5 standing mini squats at EOB using RW    General Comments General comments (skin integrity, edema, etc.): noted blood while wiping after BM, nurse in room to assess. IV intact pre/post session,      Pertinent Vitals/Pain Pain Assessment Pain Assessment: 0-10 Pain Score: 8  Pain Location: back Pain Descriptors / Indicators: Operative site guarding Pain Intervention(s): Monitored during session, Premedicated before session, Repositioned    Home Living                          Prior Function            PT Goals (current goals can now be found in the care plan section) Acute Rehab PT Goals Patient Stated Goal: less pain PT Goal Formulation: With patient Time For Goal Achievement: 04/25/23 Potential to Achieve Goals: Good Progress towards PT goals: Progressing toward goals    Frequency    Min 1X/week      PT Plan      Co-evaluation              AM-PAC PT "6 Clicks" Mobility   Outcome Measure  Help needed turning from your back to your side while in a flat bed without using bedrails?: A Little Help needed moving from lying on your back to sitting on the side of a flat bed without using bedrails?: A Little Help needed moving to and from a bed to a chair (including a wheelchair)?: A  Little Help needed standing up from a chair using your arms (e.g., wheelchair or bedside chair)?: A Little Help needed to walk in hospital room?: A Little Help needed climbing 3-5 steps with a railing? : A Lot 6 Click Score: 17    End of Session Equipment Utilized During Treatment: Gait belt;Back brace Activity Tolerance: Patient tolerated treatment well Patient left: in bed;with call bell/phone within reach;with bed alarm set Nurse Communication: Mobility status PT Visit Diagnosis: Pain;Other abnormalities of gait and mobility (R26.89);Muscle weakness (generalized) (M62.81) Pain - Right/Left: Right (bilateral) Pain - part of body: Leg (back)     Time: 1346 (+ (1314-1321))-1401 PT Time Calculation (min) (ACUTE ONLY): 15 min  Charges:                            Elmon Else, SPT    Kolbi Tofte 04/16/2023, 2:51 PM

## 2023-04-16 NOTE — Assessment & Plan Note (Addendum)
Anusol suppository as needed.

## 2023-04-16 NOTE — Progress Notes (Signed)
Occupational Therapy Treatment Patient Details Name: Adam Cabrera MRN: [id number] DOB: 05/23/1986 Today's Date: 04/16/2023   History of present illness 37 y.o. male with medical history significant for hypertension, scrotal cellulitis, history of IV drug use, presents with low back pain.  MRI of the lumbar spine showed Epidural extension with epidural phlegmon/abscess within the ventral epidural space extending from L1-2 through L3-4 and s/p lumbar laminectomy; Pt is now s/p L1-4 PSF with right L3 facetectomy 04/10/23.   OT comments  Chart reviewed to date, pt greeted in room, agreeable to OT treatment session, targeting functional activity tolerance through ADL tasks. Improved performance with decrease pain throughout. Pt demonstrating improvements in toilet transfer, requiring MIN A with frequent verbal cues for technique and safety. Educated pt on compensatory techniques for AE for improved dressing performance within precautions. Pt is making progress towards goals, will continue to benefits from skilled OT to address functional deficits. OT will follow acutely.        If plan is discharge home, recommend the following:  A little help with walking and/or transfers;A lot of help with bathing/dressing/bathroom;Assist for transportation;Assistance with cooking/housework;Help with stairs or ramp for entrance   Equipment Recommendations  BSC/3in1;Tub/shower seat    Recommendations for Other Services      Precautions / Restrictions Precautions Precautions: Back;Fall Required Braces or Orthoses: Spinal Brace Spinal Brace: Thoracolumbosacral orthotic Spinal Brace Comments: Can donn TLSO at edge of bed; per PA Alycia Rossetti 04/11/23 "He can go to the bathroom and ambulate around the room without it but if he's going to do much more than that he needs to have it on" Restrictions Weight Bearing Restrictions: No       Mobility Bed Mobility               General bed mobility comments: NT seated on  EOB pre/post session    Transfers Overall transfer level: Needs assistance Equipment used: Rolling walker (2 wheels) Transfers: Sit to/from Stand Sit to Stand: Min assist, From elevated surface           General transfer comment: frequent verbal cue for body mechanics and technique with RW     Balance Overall balance assessment: Needs assistance Sitting-balance support: Feet supported Sitting balance-Leahy Scale: Good     Standing balance support: Bilateral upper extremity supported, During functional activity, Reliant on assistive device for balance Standing balance-Leahy Scale: Good                             ADL either performed or assessed with clinical judgement   ADL Overall ADL's : Needs assistance/impaired     Grooming: Wash/dry hands;Set up;Sitting Grooming Details (indicate cue type and reason): EOB     Lower Body Bathing: Maximal assistance;Sit to/from stand;Cueing for back precautions Lower Body Bathing Details (indicate cue type and reason): BSC over toilet seat Upper Body Dressing : Moderate assistance;Sitting;Maximal assistance Upper Body Dressing Details (indicate cue type and reason): MOD A - don/doff gown, MAX A TLSO Lower Body Dressing: Maximal assistance Lower Body Dressing Details (indicate cue type and reason): ED: LB dressing techniques with AE Toilet Transfer: Rolling walker (2 wheels);BSC/3in1;Minimal assistance;Cueing for safety;Grab bars;Ambulation Toilet Transfer Details (indicate cue type and reason): verbal cues for use of grab bar   Toileting - Clothing Manipulation Details (indicate cue type and reason): inital CGA for pericare after BM, poor adherence to back precautions, required MAX A to complete     Functional mobility during  ADLs: Rolling walker (2 wheels);Contact guard assist (Approx. 15 ft 2x)      Extremity/Trunk Assessment              Vision       Perception     Praxis      Cognition Arousal:  Alert Behavior During Therapy: WFL for tasks assessed/performed Overall Cognitive Status: Within Functional Limits for tasks assessed                                          Exercises Other Exercises Other Exercises: edu re: future tx sessions targeting improving ADL status within precautions    Shoulder Instructions       General Comments noted blood while wiping after BM, nurse in room to assess. IV intact pre/post session,    Pertinent Vitals/ Pain       Pain Assessment Pain Assessment: Faces Faces Pain Scale: Hurts little more Pain Location: back Pain Descriptors / Indicators: Operative site guarding Pain Intervention(s): Limited activity within patient's tolerance, Monitored during session, Premedicated before session, Repositioned  Home Living                                          Prior Functioning/Environment              Frequency  Min 1X/week        Progress Toward Goals  OT Goals(current goals can now be found in the care plan section)  Progress towards OT goals: Progressing toward goals     Plan      Co-evaluation                 AM-PAC OT "6 Clicks" Daily Activity     Outcome Measure   Help from another person eating meals?: None Help from another person taking care of personal grooming?: None Help from another person toileting, which includes using toliet, bedpan, or urinal?: A Lot Help from another person bathing (including washing, rinsing, drying)?: A Lot Help from another person to put on and taking off regular upper body clothing?: A Little Help from another person to put on and taking off regular lower body clothing?: A Lot 6 Click Score: 17    End of Session Equipment Utilized During Treatment: Rolling walker (2 wheels);Other (comment) (TLSO)  OT Visit Diagnosis: Unsteadiness on feet (R26.81);Muscle weakness (generalized) (M62.81)   Activity Tolerance Patient tolerated treatment well    Patient Left Other (comment);with call bell/phone within reach (on edge of bed with PT student present for PT session)   Nurse Communication Other (comment) (blood after wiping for BM)        Time: 5284-1324 OT Time Calculation (min): 32 min  Charges: OT General Charges $OT Visit: 1 Visit OT Treatments $Self Care/Home Management : 23-37 mins [employer], OTS

## 2023-04-16 NOTE — Progress Notes (Signed)
Progress Note   Patient: Adam Cabrera RUE:[id number] DOB: 1985/08/18 DOA: 03/23/2023     24 DOS: the patient was seen and examined on 04/16/2023   Brief hospital course: 37 y.o. male with medical history significant for hypertension, scrotal cellulitis, history of IV drug use, presents with low back pain.  MRI of the lumbar spine showed Epidural extension with epidural phlegmon/abscess within the ventral epidural space extending from L1-2 through L3-4.  Blood culture positive for Staph aureus, patient treated with cefepime and vancomycin.  Patient has been seen by ID and neurosurgery, L spine washout performed 8/19. Patient condition had improved up to 8/26, wound VAC removed.  Patient will need 6 weeks of IV antibiotics.  Per TOC, there is no placement option.  Patient currently on on continuous nafcillin infusion.  Patient had a worsening back pain since 9/3, Patient was brought to the OR again 9/5. Had: 1. Transforaminal Lumbar Interbody Fusion L2-3.  2. Posterolateral arthrodesis L1 to L4 3. Posterior segmental instrumentation L1 to L4; 4.  Lumbar decompression with total facetectomy on the right at L2-3;  5.  Allograft placement in the 2 3 disc space for arthrodesis.     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  On 9/5, patient had a another neurosurgical procedure which included transforaminal lumbar interbody fusion of L2/L3, posterolateral arthrodesis L1-L4, posterior segment instrumentation L1-L4, lumbar decompression with total fasciotomy on the right at L2-L3, allograft placement in the 2/3 disc space for arthrodesis.  Patient on continuous nafcillin.  ID recommends 6 weeks total of IV antibiotics.  Rifampin added.    MSSA (methicillin susceptible Staphylococcus aureus) septicemia (HCC) Present on admission with tachycardia, tachypnea and epidural abscess.  Continue continuous IV  nafcillin and rifampin.  Osteomyelitis (HCC) Discitis and osteomyelitis of L2-L3.  Continue IV nafcillin and rifampin.  Psoas abscess, left (HCC) Continue IV nafcillin and rifampin.  Thrombocytopenia (HCC) Improved.  Last platelet count 160  Fresh blood passed per rectum Anusol suppository.  Obesity (BMI 30-39.9) BMI 31.42   Iron deficiency anemia Last hemoglobin 8.4.  Commendation of iron deficiency anemia and postoperative anemia.  Received iron infusions.  Patient on oral iron.  Likely hemorrhoids also contributing to anemia.  Anusol suppository.  Dry skin Will give Lac-Hydrin lotion bilateral feet.  Add antifungal.  Headache Improved with IV magnesium and 1 dose of Toradol.  As needed Fioricet.  Hyponatremia Sodium 1 point less than the normal range.  Back pain Continue with pain control.        Subjective: Patient still has some back pain.  Able to move his right leg a little bit better than last week.  Admitted with epidural abscess, Staph aureus infection, osteomyelitis.  Physical Exam: Vitals:   04/15/23 0031 04/15/23 1511 04/15/23 2332 04/16/23 0500  BP: 124/78 127/80 107/68   Pulse: 87 90 89   Resp: 18 18 18    Temp: 98.2 F (36.8 C) 98.4 F (36.9 C) 98 F (36.7 C)   TempSrc:      SpO2: 98% 100% 96%   Weight:    113.7 kg  Height:       Physical Exam HENT:     Head: Normocephalic.     Mouth/Throat:     Pharynx: No oropharyngeal exudate.  Eyes:     General: Lids are normal.     Conjunctiva/sclera: Conjunctivae normal.  Cardiovascular:     Rate and Rhythm: Normal rate and regular  rhythm.     Heart sounds: Normal heart sounds, S1 normal and S2 normal.  Pulmonary:     Breath sounds: No decreased breath sounds, wheezing, rhonchi or rales.  Abdominal:     Palpations: Abdomen is soft.     Tenderness: There is no abdominal tenderness.  Musculoskeletal:     Right lower leg: Swelling present.     Left lower leg: Swelling present.  Skin:     General: Skin is warm.     Comments: Dried scaling bilateral feet.    Neurological:     Mental Status: He is alert and oriented to person, place, and time.     Comments: Patient able to straight leg raise with right leg.     Data Reviewed: Creatinine 0.44, hemoglobin 8.4  Disposition: Status is: Inpatient Remains inpatient appropriate because: Will need to complete 6 weeks of IV antibiotics.  Planned Discharge Destination: Home with Home Health    Time spent: 28 minutes  Author: Alford Highland, MD 04/16/2023 2:09 PM  For on call review [email or web address].

## 2023-04-16 NOTE — Plan of Care (Signed)
  Problem: Activity: Goal: Risk for activity intolerance will decrease Outcome: Progressing   

## 2023-04-17 DIAGNOSIS — A419 Sepsis, unspecified organism: Secondary | ICD-10-CM | POA: Diagnosis not present

## 2023-04-17 DIAGNOSIS — M8600 Acute hematogenous osteomyelitis, unspecified site: Secondary | ICD-10-CM | POA: Diagnosis not present

## 2023-04-17 DIAGNOSIS — G061 Intraspinal abscess and granuloma: Secondary | ICD-10-CM | POA: Diagnosis not present

## 2023-04-17 DIAGNOSIS — K6812 Psoas muscle abscess: Secondary | ICD-10-CM | POA: Diagnosis not present

## 2023-04-17 MED ORDER — HYDROMORPHONE HCL 1 MG/ML IJ SOLN
1.0000 mg | Freq: Four times a day (QID) | INTRAMUSCULAR | Status: DC | PRN
  Administered 2023-04-17 – 2023-04-24 (×15): 1 mg via INTRAVENOUS
  Filled 2023-04-17 (×15): qty 1

## 2023-04-17 MED ORDER — INFLUENZA VIRUS VACC SPLIT PF (FLUZONE) 0.5 ML IM SUSY
0.5000 mL | PREFILLED_SYRINGE | INTRAMUSCULAR | Status: AC
  Administered 2023-04-18: 0.5 mL via INTRAMUSCULAR
  Filled 2023-04-17: qty 0.5

## 2023-04-17 NOTE — Plan of Care (Signed)
  Problem: Safety: Goal: Ability to remain free from injury will improve Outcome: Progressing   Problem: Education: Goal: Knowledge of General Education information will improve Description Including pain rating scale, medication(s)/side effects and non-pharmacologic comfort measures Outcome: Progressing

## 2023-04-17 NOTE — Progress Notes (Signed)
Progress Note   Patient: Adam Cabrera IHK:[id number] DOB: 01/01/1986 DOA: 03/23/2023     25 DOS: the patient was seen and examined on 04/17/2023   Brief hospital course: 37 y.o. male with medical history significant for hypertension, scrotal cellulitis, history of IV drug use, presents with low back pain.  MRI of the lumbar spine showed Epidural extension with epidural phlegmon/abscess within the ventral epidural space extending from L1-2 through L3-4.  Blood culture positive for Staph aureus, patient treated with cefepime and vancomycin.  Patient has been seen by ID and neurosurgery, L spine washout performed 8/19. Patient condition had improved up to 8/26, wound VAC removed.  Patient will need 6 weeks of IV antibiotics.  Per TOC, there is no placement option.  Patient currently on on continuous nafcillin infusion.  Patient had a worsening back pain since 9/3, Patient was brought to the OR again 9/5. Had: 1. Transforaminal Lumbar Interbody Fusion L2-3.  2. Posterolateral arthrodesis L1 to L4 3. Posterior segmental instrumentation L1 to L4; 4.  Lumbar decompression with total facetectomy on the right at L2-3;  5.  Allograft placement in the 2 3 disc space for arthrodesis.     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  On 9/5, patient had a another neurosurgical procedure which included transforaminal lumbar interbody fusion of L2/L3, posterolateral arthrodesis L1-L4, posterior segment instrumentation L1-L4, lumbar decompression with total fasciotomy on the right at L2-L3, allograft placement in the 2/3 disc space for arthrodesis.  Patient on continuous nafcillin.  Continue rifampin.  ID recommends 6 weeks total of IV antibiotics.     MSSA (methicillin susceptible Staphylococcus aureus) septicemia (HCC) Present on admission with tachycardia, tachypnea and epidural abscess.  Continue continuous IV  nafcillin and rifampin.  Osteomyelitis (HCC) Discitis and osteomyelitis of L2-L3.  Continue IV nafcillin and rifampin.  Psoas abscess, left (HCC) Continue IV nafcillin and rifampin.  Thrombocytopenia (HCC) Improved.  Last platelet count 160  Fresh blood passed per rectum Anusol suppository.  Obesity (BMI 30-39.9) BMI 32.07   Iron deficiency anemia Last hemoglobin 8.4.  Combination of iron deficiency anemia and postoperative anemia.  Received iron infusions.  Patient on oral iron.  Likely hemorrhoids also contributing to anemia.  Anusol suppository.  Dry skin Will give Lac-Hydrin and antifungal to bilateral feet.  Headache As needed Fioricet.  Hyponatremia Resolved  Back pain Continue with pain control.        Subjective: Patient states that he only takes the IV pain medications around twice a day only when it is really painful.  Patient does have some back pain but overall feels better after the second surgery.  Admitted with Staph aureus sepsis, epidural abscess and osteomyelitis.  Physical Exam: Vitals:   04/16/23 1737 04/17/23 0102 04/17/23 0442 04/17/23 0756  BP: 117/80 124/74  117/73  Pulse: 88 89  77  Resp: 17 18  17   Temp: 98.2 F (36.8 C) 98 F (36.7 C)  97.8 F (36.6 C)  TempSrc:    Oral  SpO2: 100% 100%  100%  Weight:   113.3 kg   Height:       Physical Exam HENT:     Head: Normocephalic.     Mouth/Throat:     Pharynx: No oropharyngeal exudate.  Eyes:     General: Lids are normal.     Conjunctiva/sclera: Conjunctivae normal.  Cardiovascular:     Rate and Rhythm: Normal rate and regular rhythm.  Heart sounds: Normal heart sounds, S1 normal and S2 normal.  Pulmonary:     Breath sounds: No decreased breath sounds, wheezing, rhonchi or rales.  Abdominal:     Palpations: Abdomen is soft.     Tenderness: There is no abdominal tenderness.  Musculoskeletal:     Right lower leg: Swelling present.     Left lower leg: Swelling present.   Skin:    General: Skin is warm.     Comments: Dried scaling bilateral feet.    Neurological:     Mental Status: He is alert and oriented to person, place, and time.     Comments: Patient able to straight leg raise with right leg.     Data Reviewed: Last hemoglobin 8.4   Disposition: Status is: Inpatient Remains inpatient appropriate because: Will need 6 weeks of IV antibiotics.  Planned Discharge Destination: Home with home health    Time spent: 28 minutes  Author: Alford Highland, MD 04/17/2023 2:30 PM  For on call review [email or web address].

## 2023-04-17 NOTE — Progress Notes (Signed)
Physical Therapy Treatment Patient Details Name: Adam Cabrera MRN: [id number] DOB: [birth year] Today's Date: 04/17/2023   History of Present Illness 36 y.o. male with medical history significant for hypertension, scrotal cellulitis, history of IV drug use, presents with low back pain.  MRI of the lumbar spine showed Epidural extension with epidural phlegmon/abscess within the ventral epidural space extending from L1-2 through L3-4 and s/p lumbar laminectomy; Pt is now s/p L1-4 PSF with right L3 facetectomy 04/10/23.    PT Comments  Pt is making good progress towards PT goals. Pt is received in bed with family at bedside, he is agreeable to PT session. Pt cont to demonstrate improvements with overall mobility, performing bed mobility mod I, transfers sup A, and amb CGA. Pt requested attempting STS on his own and was successful on second attempt following cuing for hand placement and "nose over toes" technique. Able to test during today's visit to measure endurance with results of 152.4 m (500 ft) using RW with 2 resting breaks. No reports of increased pain or discomfort during amb activity but did report 3/10 modified Borg for fatigue. Pt in recliner at end of session. Pt would benefit from cont skilled PT to address above deficits and promote optimal return to PLOF.    If plan is discharge home, recommend the following: Assistance with cooking/housework;Assist for transportation;Help with stairs or ramp for entrance;A little help with walking and/or transfers;A little help with bathing/dressing/bathroom   Can travel by private vehicle        Equipment Recommendations  Rolling walker (2 wheels);Wheelchair (measurements PT);Wheelchair cushion (measurements PT);Other (comment)    Recommendations for Other Services       Precautions / Restrictions Precautions Precautions: Back;Fall Precaution Booklet Issued: No Required Braces or Orthoses: Spinal Brace Spinal Brace: Thoracolumbosacral  orthotic Spinal Brace Comments: Can donn TLSO at edge of bed; per PA Alycia Rossetti 04/11/23 "He can go to the bathroom and ambulate around the room without it but if he's going to do much more than that he needs to have it on" Restrictions Weight Bearing Restrictions: No     Mobility  Bed Mobility Overal bed mobility: Needs Assistance Bed Mobility: Rolling, Sidelying to Sit Rolling: Supervision, Used rails, Modified independent (Device/Increase time) Sidelying to sit: Used rails, Contact guard assist, HOB elevated       General bed mobility comments: Improved bed mobility requiring less degree of elevated head to aid in sitting EOB; no cuing required    Transfers Overall transfer level: Needs assistance Equipment used: Rolling walker (2 wheels) Transfers: Sit to/from Stand Sit to Stand: From elevated surface, Supervision           General transfer comment: Pt requesting to perform STS "on my own"; First attempt of STS from elevated bed, Pt was unsuccessful; assisted with hand placement cuing and head over toes technique to facilitate STS permitting him to be successful in second attempt STS with sup A    Ambulation/Gait Ambulation/Gait assistance: Contact guard assist Gait Distance (Feet): 500 Feet Assistive device: Rolling walker (2 wheels) Gait Pattern/deviations: Wide base of support, Step-through pattern, Decreased stride length Gait velocity: slightly decreased     General Gait Details: Improved reliance on AD noted during amb activity and ability to maintain upright posture; occasional cuing for maintaining AD closer to trunk   Stairs             Wheelchair Mobility     Tilt Bed    Modified Rankin (Stroke Patients Only)  Balance Overall balance assessment: Needs assistance Sitting-balance support: Feet supported, Single extremity supported, No upper extremity supported Sitting balance-Leahy Scale: Good Sitting balance - Comments: Able to maintain seated  EOB balance during donning of TLSO brace with occasional no UE support indicating improvement   Standing balance support: Bilateral upper extremity supported, During functional activity, Reliant on assistive device for balance Standing balance-Leahy Scale: Good Standing balance comment: Able to maintain static standing balance during functional activities; cuing to decrease slight posterior lean during static stance and able to correct                            Cognition Arousal: Alert Behavior During Therapy: WFL for tasks assessed/performed Overall Cognitive Status: Within Functional Limits for tasks assessed                                 General Comments: Pleasant and eager to work with PT        Exercises Other Exercises Other Exercises: Performed : 152.4 m (500 ft) using RW and 2 standing breaks    General Comments        Pertinent Vitals/Pain Pain Assessment Pain Assessment: 0-10 Pain Score: 6  Pain Location: back Pain Descriptors / Indicators: Operative site guarding, Sore Pain Intervention(s): Monitored during session, Premedicated before session, Repositioned    Home Living                          Prior Function            PT Goals (current goals can now be found in the care plan section) Acute Rehab PT Goals Patient Stated Goal: less pain PT Goal Formulation: With patient Time For Goal Achievement: 04/25/23 Potential to Achieve Goals: Good Progress towards PT goals: Progressing toward goals    Frequency    Min 1X/week      PT Plan      Co-evaluation              AM-PAC PT "6 Clicks" Mobility   Outcome Measure  Help needed turning from your back to your side while in a flat bed without using bedrails?: None Help needed moving from lying on your back to sitting on the side of a flat bed without using bedrails?: None Help needed moving to and from a bed to a chair (including a wheelchair)?: A  Little Help needed standing up from a chair using your arms (e.g., wheelchair or bedside chair)?: A Little Help needed to walk in hospital room?: A Little Help needed climbing 3-5 steps with a railing? : A Lot 6 Click Score: 19    End of Session Equipment Utilized During Treatment: Gait belt;Back brace Activity Tolerance: Patient tolerated treatment well Patient left: in chair;with call bell/phone within reach;with chair alarm set Nurse Communication: Mobility status PT Visit Diagnosis: Pain;Other abnormalities of gait and mobility (R26.89);Muscle weakness (generalized) (M62.81) Pain - Right/Left:  (bilateral) Pain - part of body:  (back)     Time: 0865-7846 PT Time Calculation (min) (ACUTE ONLY): 24 min  Charges:                            Elmon Else, SPT    Jomari Bartnik 04/17/2023, 3:01 PM

## 2023-04-17 NOTE — Progress Notes (Signed)
Entered room to attempt to change dressing per PA request. Patient requests dressing change be completed when he gets out of recliner and back to bed; currently eating.

## 2023-04-17 NOTE — TOC Progression Note (Signed)
Transition of Care [facility]) - Progression Note    Patient Details  Name: Adam Cabrera MRN: [id number] Date of Birth: 03-11-1986  [facility] of [facility]) CM/SW Contact  Marlowe Sax, RN Phone Number: 04/17/2023, 1:25 PM  Clinical Narrative:     Pt able to amb approx 315 ft using RW   NO updates for [facility]  Expected Discharge Plan: Home/Self Care Barriers to Discharge: Continued Medical Work up  Expected Discharge Plan and Services                                               Social Determinants of Health (SDOH) Interventions SDOH Screenings   Food Insecurity: Patient Declined (04/17/2023)  Housing: Patient Declined (04/17/2023)  Transportation Needs: Patient Declined (04/17/2023)  Utilities: Patient Declined (04/17/2023)  Tobacco Use: High Risk (04/10/2023)    Readmission Risk Interventions     No data to display

## 2023-04-18 DIAGNOSIS — A419 Sepsis, unspecified organism: Secondary | ICD-10-CM | POA: Diagnosis not present

## 2023-04-18 DIAGNOSIS — E876 Hypokalemia: Secondary | ICD-10-CM

## 2023-04-18 DIAGNOSIS — M8600 Acute hematogenous osteomyelitis, unspecified site: Secondary | ICD-10-CM | POA: Diagnosis not present

## 2023-04-18 DIAGNOSIS — D696 Thrombocytopenia, unspecified: Secondary | ICD-10-CM | POA: Diagnosis not present

## 2023-04-18 DIAGNOSIS — G061 Intraspinal abscess and granuloma: Secondary | ICD-10-CM | POA: Diagnosis not present

## 2023-04-18 LAB — CBC
HCT: 24.2 % — ABNORMAL LOW (ref 39.0–52.0)
Hemoglobin: 7.6 g/dL — ABNORMAL LOW (ref 13.0–17.0)
MCH: 28.6 pg (ref 26.0–34.0)
MCHC: 31.4 g/dL (ref 30.0–36.0)
MCV: 91 fL (ref 80.0–100.0)
Platelets: 133 10*3/uL — ABNORMAL LOW (ref 150–400)
RBC: 2.66 MIL/uL — ABNORMAL LOW (ref 4.22–5.81)
RDW: 21.7 % — ABNORMAL HIGH (ref 11.5–15.5)
WBC: 3.3 10*3/uL — ABNORMAL LOW (ref 4.0–10.5)
nRBC: 0 % (ref 0.0–0.2)

## 2023-04-18 LAB — COMPREHENSIVE METABOLIC PANEL
ALT: 13 U/L (ref 0–44)
AST: 14 U/L — ABNORMAL LOW (ref 15–41)
Albumin: 2.9 g/dL — ABNORMAL LOW (ref 3.5–5.0)
Alkaline Phosphatase: 49 U/L (ref 38–126)
Anion gap: 8 (ref 5–15)
BUN: 8 mg/dL (ref 6–20)
CO2: 25 mmol/L (ref 22–32)
Calcium: 7.8 mg/dL — ABNORMAL LOW (ref 8.9–10.3)
Chloride: 104 mmol/L (ref 98–111)
Creatinine, Ser: 0.4 mg/dL — ABNORMAL LOW (ref 0.61–1.24)
GFR, Estimated: >60 mL/min (ref >60)
Glucose, Bld: 122 mg/dL — ABNORMAL HIGH (ref 70–99)
Potassium: 2.9 mmol/L — ABNORMAL LOW (ref 3.5–5.1)
Sodium: 137 mmol/L (ref 135–145)
Total Bilirubin: 1.2 mg/dL (ref 0.3–1.2)
Total Protein: 5.8 g/dL — ABNORMAL LOW (ref 6.5–8.1)

## 2023-04-18 MED ORDER — POTASSIUM CHLORIDE CRYS ER 20 MEQ PO TBCR
40.0000 meq | EXTENDED_RELEASE_TABLET | Freq: Once | ORAL | Status: AC
  Administered 2023-04-18: 40 meq via ORAL
  Filled 2023-04-18: qty 2

## 2023-04-18 NOTE — Plan of Care (Signed)
Problem: Education: Goal: Knowledge of General Education information will improve Description: Including pain rating scale, medication(s)/side effects and non-pharmacologic comfort measures Outcome: Progressing   Problem: Pain Managment: Goal: General experience of comfort will improve Outcome: Progressing   Problem: Skin Integrity: Goal: Risk for impaired skin integrity will decrease Outcome: Progressing

## 2023-04-18 NOTE — Progress Notes (Signed)
Pt refused mobility at this time. Pain 9/10. PRN pain medication given. Pt wanted for PRN pain medication to kick in and wait for PT/OT.

## 2023-04-18 NOTE — Progress Notes (Signed)
Progress Note   Patient: Adam Cabrera WUJ:[id number] DOB: [birth year] DOA: 03/23/2023     26 DOS: the patient was seen and examined on 04/18/2023   Brief hospital course: 37 y.o. male with medical history significant for hypertension, scrotal cellulitis, history of IV drug use, presents with low back pain.  MRI of the lumbar spine showed Epidural extension with epidural phlegmon/abscess within the ventral epidural space extending from L1-2 through L3-4.  Blood culture positive for Staph aureus, patient treated with cefepime and vancomycin.  Patient has been seen by ID and neurosurgery, L spine washout performed 8/19. Patient condition had improved up to 8/26, wound VAC removed.  Patient will need 6 weeks of IV antibiotics.  Per TOC, there is no placement option.  Patient currently on on continuous nafcillin infusion.  Patient had a worsening back pain since 9/3, Patient was brought to the OR again 9/5. Had: 1. Transforaminal Lumbar Interbody Fusion L2-3.  2. Posterolateral arthrodesis L1 to L4 3. Posterior segmental instrumentation L1 to L4; 4.  Lumbar decompression with total facetectomy on the right at L2-3;  5.  Allograft placement in the 2 3 disc space for arthrodesis.  One of the wound cultures from 9/5 growing rare Staph aureus.  9/13 replacing potassium.   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  On 9/5, patient had a another neurosurgical procedure which included transforaminal lumbar interbody fusion of L2/L3, posterolateral arthrodesis L1-L4, posterior segment instrumentation L1-L4, lumbar decompression with total fasciotomy on the right at L2-L3, allograft placement in the 2/3 disc space for arthrodesis.  Patient on continuous nafcillin.  Continue rifampin.  ID recommends 6 weeks total of IV antibiotics.  Since one of the cultures from 9/5 growing Staph aureus may have to be 6 weeks of  antibiotics from that culture.    MSSA (methicillin susceptible Staphylococcus aureus) septicemia (HCC) Present on admission with tachycardia, tachypnea and epidural abscess.  Continue continuous IV nafcillin and rifampin.  Osteomyelitis (HCC) Discitis and osteomyelitis of L2-L3.  Continue IV nafcillin and rifampin.  Psoas abscess, left (HCC) Continue IV nafcillin and rifampin.  Thrombocytopenia (HCC) Improved.  Last platelet count 133  Fresh blood passed per rectum Anusol suppository.  Obesity (BMI 30-39.9) BMI 32.55   Hypokalemia Potassium 2.9 today.  Replace potassium 3 times today.  Iron deficiency anemia Last hemoglobin 7.6.  Combination of iron deficiency anemia and postoperative anemia.  Patient on oral iron.  Likely hemorrhoids also contributing to anemia.  Anusol suppository.  May end up needing a blood transfusion if hemoglobin drops further.  Dry skin Will give Lac-Hydrin and antifungal to bilateral feet.  Headache As needed Fioricet.  Hyponatremia Resolved  Back pain Continue with pain control.        Subjective: Patient still has some back soreness at the site of surgery.  Moving his right leg better since the second surgery.  Admitted with Staph aureus sepsis, epidural abscess and osteomyelitis.  Physical Exam: Vitals:   04/17/23 1631 04/18/23 0500 04/18/23 0754 04/18/23 1548  BP: 122/78  118/72 130/71  Pulse: 95  81 87  Resp: 17  17 16   Temp: 98 F (36.7 C)  97.9 F (36.6 C) 98.2 F (36.8 C)  TempSrc:      SpO2: 98%  100% 100%  Weight:  115 kg    Height:       Physical Exam HENT:     Head: Normocephalic.  Mouth/Throat:     Pharynx: No oropharyngeal exudate.  Eyes:     General: Lids are normal.     Conjunctiva/sclera: Conjunctivae normal.  Cardiovascular:     Rate and Rhythm: Normal rate and regular rhythm.     Heart sounds: Normal heart sounds, S1 normal and S2 normal.  Pulmonary:     Breath sounds: No decreased breath  sounds, wheezing, rhonchi or rales.  Abdominal:     Palpations: Abdomen is soft.     Tenderness: There is no abdominal tenderness.  Musculoskeletal:     Right lower leg: Swelling present.     Left lower leg: Swelling present.  Skin:    General: Skin is warm.     Comments: Dried scaling bilateral feet.    Neurological:     Mental Status: He is alert and oriented to person, place, and time.     Comments: Patient able to straight leg raise bilaterally.  Patient was able to sit up on his own.     Data Reviewed: One of the operative reports from 9/5 shows rare Staph aureus. Potassium 2.9, hemoglobin 7.6, white blood cell count 3.3, platelet count 133  Disposition: Status is: Inpatient Remains inpatient appropriate because: Will need 6 weeks of IV antibiotics.  May end up needing a blood transfusion.  Replace potassium today.  Planned Discharge Destination: Home with Home Health    Time spent: 28 minutes  Author: Alford Highland, MD 04/18/2023 4:32 PM  For on call review [email or web address].

## 2023-04-18 NOTE — Plan of Care (Signed)
  Problem: Safety: Goal: Ability to remain free from injury will improve Outcome: Progressing   Problem: Activity: Goal: Risk for activity intolerance will decrease Outcome: Progressing   Problem: Nutrition: Goal: Adequate nutrition will be maintained Outcome: Progressing   Problem: Pain Managment: Goal: General experience of comfort will improve Outcome: Progressing   Problem: Education: Goal: Knowledge of General Education information will improve Description: Including pain rating scale, medication(s)/side effects and non-pharmacologic comfort measures Outcome: Progressing   Problem: Skin Integrity: Goal: Risk for impaired skin integrity will decrease Outcome: Progressing

## 2023-04-18 NOTE — Progress Notes (Signed)
Physical Therapy Treatment Patient Details Name: Adam Cabrera MRN: [id number] DOB: 08/01/1986 Today's Date: 04/18/2023   History of Present Illness 37 y.o. male with medical history significant for hypertension, scrotal cellulitis, history of IV drug use, presents with low back pain.  MRI of the lumbar spine showed Epidural extension with epidural phlegmon/abscess within the ventral epidural space extending from L1-2 through L3-4 and s/p lumbar laminectomy; Pt is now s/p L1-4 PSF with right L3 facetectomy 04/10/23.    PT Comments  Ppt was supine in bed upon arrival. He remains A and O x 4 and agreeable to PT session. Pt did not require physical assistance to exit bed, stand to RW, or tolerate ambulation 3 laps around hallway. Pt did however require assistance for proper application of TLSO prior to OOB activity. Acute PT will continue to follow to progress pt to maximal independence with all ADLs. Recommended pt increase OOB/ambulation throughout the day with RN staff and mobility team.    If plan is discharge home, recommend the following: Assistance with cooking/housework;Assist for transportation;Help with stairs or ramp for entrance;A little help with walking and/or transfers;A little help with bathing/dressing/bathroom     Equipment Recommendations  Other (comment) (ongoing assessment)       Precautions / Restrictions Precautions Precautions: Back;Fall Precaution Booklet Issued: No Required Braces or Orthoses: Spinal Brace Spinal Brace: Thoracolumbosacral orthotic Spinal Brace Comments: Can donn TLSO at edge of bed; per PA Alycia Rossetti 04/11/23 "He can go to the bathroom and ambulate around the room without it but if he's going to do much more than that he needs to have it on" Restrictions Weight Bearing Restrictions: No     Mobility  Bed Mobility Overal bed mobility: Needs Assistance Bed Mobility: Rolling, Sidelying to Sit, Sit to Sidelying Rolling: Supervision, Used rails, Modified  independent (Device/Increase time) Sidelying to sit: Supervision Supine to sit: Supervision     Transfers Overall transfer level: Needs assistance Equipment used: Rolling walker (2 wheels) Transfers: Sit to/from Stand Sit to Stand: From elevated surface, Supervision     Ambulation/Gait Ambulation/Gait assistance: Supervision Gait Distance (Feet): 600 Feet Assistive device: Rolling walker (2 wheels) Gait Pattern/deviations: Step-through pattern Gait velocity: WNL  General Gait Details: pt continues to tolerate ambulation and OOB activity better each day. recommended pt get uop and ambulate more freqently.    Balance Overall balance assessment: Needs assistance Sitting-balance support: Feet supported Sitting balance-Leahy Scale: Good     Standing balance support: Bilateral upper extremity supported, During functional activity, Reliant on assistive device for balance Standing balance-Leahy Scale: Good     Cognition Arousal: Alert Behavior During Therapy: WFL for tasks assessed/performed Overall Cognitive Status: Within Functional Limits for tasks assessed   General Comments: pt is A and O x 4. cooperative throughout           General Comments General comments (skin integrity, edema, etc.): Incision intact pre/post session      Pertinent Vitals/Pain Pain Assessment Pain Assessment: 0-10 Pain Score: 6  Pain Location: back Pain Descriptors / Indicators: Discomfort, Sore, Burning Pain Intervention(s): Limited activity within patient's tolerance, Monitored during session, Premedicated before session, Repositioned     PT Goals (current goals can now be found in the care plan section) Acute Rehab PT Goals Patient Stated Goal: none stated Progress towards PT goals: Progressing toward goals    Frequency    Min 1X/week       Co-evaluation     PT goals addressed during session: Mobility/safety with mobility;Balance;Proper use of  DME;Strengthening/ROM         AM-PAC PT "6 Clicks" Mobility   Outcome Measure  Help needed turning from your back to your side while in a flat bed without using bedrails?: None Help needed moving from lying on your back to sitting on the side of a flat bed without using bedrails?: None Help needed moving to and from a bed to a chair (including a wheelchair)?: A Little Help needed standing up from a chair using your arms (e.g., wheelchair or bedside chair)?: A Little Help needed to walk in hospital room?: A Little Help needed climbing 3-5 steps with a railing? : A Little 6 Click Score: 20    End of Session Equipment Utilized During Treatment: Back brace (TLSO) Activity Tolerance: Patient tolerated treatment well Patient left: in chair;with call bell/phone within reach;with chair alarm set Nurse Communication: Mobility status PT Visit Diagnosis: Pain;Other abnormalities of gait and mobility (R26.89);Muscle weakness (generalized) (M62.81)     Time: 6578-4696 PT Time Calculation (min) (ACUTE ONLY): 16 min  Charges:    $Gait Training: 8-22 mins PT General Charges $$ ACUTE PT VISIT: 1 Visit                     Jetta Lout PTA 04/18/23, 1:57 PM

## 2023-04-18 NOTE — Assessment & Plan Note (Signed)
Replaced.

## 2023-04-18 NOTE — Plan of Care (Signed)
  Problem: Education: Goal: Knowledge of General Education information will improve Description: Including pain rating scale, medication(s)/side effects and non-pharmacologic comfort measures Outcome: Progressing   Problem: Health Behavior/Discharge Planning: Goal: Ability to manage health-related needs will improve Outcome: Progressing   Problem: Clinical Measurements: Goal: Cardiovascular complication will be avoided Outcome: Progressing   Problem: Activity: Goal: Risk for activity intolerance will decrease Outcome: Progressing   Problem: Nutrition: Goal: Adequate nutrition will be maintained Outcome: Progressing   Problem: Elimination: Goal: Will not experience complications related to bowel motility Outcome: Progressing Goal: Will not experience complications related to urinary retention Outcome: Progressing   Problem: Pain Managment: Goal: General experience of comfort will improve Outcome: Progressing

## 2023-04-18 NOTE — Progress Notes (Signed)
Occupational Therapy Treatment Patient Details Name: Adam Cabrera MRN: [id number] DOB: 07/07/1986 Today's Date: 04/18/2023   History of present illness 37 y.o. male with medical history significant for hypertension, scrotal cellulitis, history of IV drug use, presents with low back pain.  MRI of the lumbar spine showed Epidural extension with epidural phlegmon/abscess within the ventral epidural space extending from L1-2 through L3-4 and s/p lumbar laminectomy; Pt is now s/p L1-4 PSF with right L3 facetectomy 04/10/23.   OT comments  Pt seen for OT treatment on this date. Upon arrival to room pt was supine in bed, agreeable to OT Tx session. OT facilitated ADL management as described below. See ADL section for additional details regarding occupational performance. Pt pain has progressively decreased, resulting in an increase in overall activity tolerance. Pt participated in AE education training with use of reacher and sock aid for LB dressing. Pt returned verbalizing understanding of education provided t/o session. Pt needed MOD A to adjust sock when donning on foot x2. During donning underwear Pt used reacher to place foot through underwear. MIN A needed to adjust underwear from sock gripper x2. Pt transferred from sidelying to sit with supervision, then amb using RW to bathroom for toileting transfer with CGA. Pt required MOD A to stand from seated position on toilet. Intermittent vcs required throughout for technique and precaution adherence. TSLO donned on edge of bed and worn during mobility. Pt is progressing toward OT goals and continues to benefit from skilled OT services to maximize return to PLOF and minimize risk of future falls, injury, caregiver burden, and readmission. Will continue to follow POC as written. Discharge recommendation remains appropriate.        If plan is discharge home, recommend the following:  A little help with walking and/or transfers;A lot of help with  bathing/dressing/bathroom;Assist for transportation;Assistance with cooking/housework;Help with stairs or ramp for entrance   Equipment Recommendations  BSC/3in1;Tub/shower seat    Recommendations for Other Services      Precautions / Restrictions Precautions Precautions: Back;Fall Required Braces or Orthoses: Spinal Brace Spinal Brace: Thoracolumbosacral orthotic Spinal Brace Comments: Can donn TLSO at edge of bed; per PA Alycia Rossetti 04/11/23 "He can go to the bathroom and ambulate around the room without it but if he's going to do much more than that he needs to have it on" Restrictions Weight Bearing Restrictions: No       Mobility Bed Mobility Overal bed mobility: Needs Assistance Bed Mobility: Rolling, Sidelying to Sit, Sit to Sidelying Rolling: Supervision, Used rails, Modified independent (Device/Increase time) Sidelying to sit: Supervision, HOB elevated, Used rails     Sit to sidelying: Min assist, HOB elevated, Used rails General bed mobility comments: Needed MIN A physical assistance in bringing legs up onto the bed    Transfers Overall transfer level: Needs assistance Equipment used: Rolling walker (2 wheels) Transfers: Sit to/from Stand Sit to Stand: From elevated surface, Supervision                 Balance Overall balance assessment: Needs assistance Sitting-balance support: Feet supported   Sitting balance - Comments: Able to maintain seated EOB balance during donning of TLSO brace with occasional no UE support indicating improvement   Standing balance support: Bilateral upper extremity supported, During functional activity, Reliant on assistive device for balance   Standing balance comment: Able to maintain static standing balance during functional activities  ADL either performed or assessed with clinical judgement   ADL Overall ADL's : Needs assistance/impaired                   Upper Body Dressing Details  (indicate cue type and reason): MAX A TLSO Lower Body Dressing: Set up;Adhering to back precautions;Cueing for back precautions;Moderate assistance;With adaptive equipment Lower Body Dressing Details (indicate cue type and reason): ED: LB dressing techniques with AE reacher and sock aid - inital demo of AE with verbal/visual cues during attemps including 2 socks and underwear Toilet Transfer: Rolling walker (2 wheels);Cueing for safety;Grab bars;Ambulation;Moderate assistance Toilet Transfer Details (indicate cue type and reason): verbal cues for technique Toileting- Clothing Manipulation and Hygiene: Minimal assistance Toileting - Clothing Manipulation Details (indicate cue type and reason): anticipated     Functional mobility during ADLs: Rolling walker (2 wheels);Contact guard assist approx 15' with RW 2 attempts to bathroom and back       Extremity/Trunk Assessment              Vision       Perception     Praxis      Cognition Arousal: Alert Behavior During Therapy: WFL for tasks assessed/performed Overall Cognitive Status: Within Functional Limits for tasks assessed                                 General Comments: Pleasant and eager to work with OT        Exercises Other Exercises Other Exercises: Edu re: ADL training for LB dressing with a sock aid and a reacher donning underwear    Shoulder Instructions       General Comments Incision intact pre/post session    Pertinent Vitals/ Pain       Pain Assessment Pain Assessment: 0-10 Pain Score: 8  Pain Location: back Pain Descriptors / Indicators: Sore, Spasm, Operative site guarding, Tender Pain Intervention(s): Monitored during session, Repositioned, Utilized relaxation techniques, Limited activity within patient's tolerance  Home Living                                          Prior Functioning/Environment              Frequency  Min 1X/week        Progress  Toward Goals  OT Goals(current goals can now be found in the care plan section)  Progress towards OT goals: Progressing toward goals  Acute Rehab OT Goals OT Goal Formulation: With patient Time For Goal Achievement: 05/02/23 Potential to Achieve Goals: Good  Plan      Co-evaluation                 AM-PAC OT "6 Clicks" Daily Activity     Outcome Measure   Help from another person eating meals?: None Help from another person taking care of personal grooming?: None Help from another person toileting, which includes using toliet, bedpan, or urinal?: A Lot Help from another person bathing (including washing, rinsing, drying)?: A Lot Help from another person to put on and taking off regular upper body clothing?: A Little Help from another person to put on and taking off regular lower body clothing?: A Lot 6 Click Score: 17    End of Session Equipment Utilized During Treatment: Rolling walker (2 wheels);Other (comment)  OT Visit  Diagnosis: Unsteadiness on feet (R26.81);Muscle weakness (generalized) (M62.81)   Activity Tolerance Patient tolerated treatment well   Patient Left with call bell/phone within reach;in bed   Nurse Communication Mobility status        Time: 6045-4098 OT Time Calculation (min): 30 min  Charges: OT General Charges $OT Visit: 1 Visit OT Treatments $Self Care/Home Management : 23-37 mins [employer], OTS

## 2023-04-18 NOTE — TOC Progression Note (Signed)
Transition of Care [facility]) - Progression Note    Patient Details  Name: Adam Cabrera MRN: [id number] Date of Birth: 18-Nov-1985  Transition of [facility] CM/SW Contact  Marlowe Sax, RN Phone Number: 04/18/2023, 11:35 AM  Clinical Narrative:     The patient has tolerated day 24 of IV ABX, will continue to stay in the hospital for the 6 weeks of IV ABX,  TOC will continue to follow and assist with needs and DC planning   Expected Discharge Plan: Home/Self Care Barriers to Discharge: Continued Medical Work up  Expected Discharge Plan and Services                                               Social Determinants of Health (SDOH) Interventions SDOH Screenings   Food Insecurity: Patient Declined (04/17/2023)  Housing: Patient Declined (04/17/2023)  Transportation Needs: Patient Declined (04/17/2023)  Utilities: Patient Declined (04/17/2023)  Tobacco Use: High Risk (04/10/2023)    Readmission Risk Interventions     No data to display

## 2023-04-19 DIAGNOSIS — D696 Thrombocytopenia, unspecified: Secondary | ICD-10-CM | POA: Diagnosis not present

## 2023-04-19 DIAGNOSIS — G061 Intraspinal abscess and granuloma: Secondary | ICD-10-CM | POA: Diagnosis not present

## 2023-04-19 DIAGNOSIS — A419 Sepsis, unspecified organism: Secondary | ICD-10-CM | POA: Diagnosis not present

## 2023-04-19 DIAGNOSIS — M8600 Acute hematogenous osteomyelitis, unspecified site: Secondary | ICD-10-CM | POA: Diagnosis not present

## 2023-04-19 LAB — TYPE AND SCREEN
ABO/RH(D): O NEG
Antibody Screen: NEGATIVE

## 2023-04-19 LAB — POTASSIUM: Potassium: 3.6 mmol/L (ref 3.5–5.1)

## 2023-04-19 LAB — HEMOGLOBIN: Hemoglobin: 8.1 g/dL — ABNORMAL LOW (ref 13.0–17.0)

## 2023-04-19 LAB — PHOSPHORUS: Phosphorus: 3.8 mg/dL (ref 2.5–4.6)

## 2023-04-19 LAB — MAGNESIUM: Magnesium: 1.9 mg/dL (ref 1.7–2.4)

## 2023-04-19 NOTE — Plan of Care (Signed)

## 2023-04-19 NOTE — Plan of Care (Signed)
  Problem: Education: Goal: Knowledge of General Education information will improve Description: Including pain rating scale, medication(s)/side effects and non-pharmacologic comfort measures Outcome: Progressing   Problem: Pain Managment: Goal: General experience of comfort will improve Outcome: Progressing   Problem: Activity: Goal: Risk for activity intolerance will decrease Outcome: Progressing   Problem: Elimination: Goal: Will not experience complications related to bowel motility Outcome: Progressing   Problem: Coping: Goal: Level of anxiety will decrease Outcome: Progressing

## 2023-04-19 NOTE — Progress Notes (Signed)
Progress Note   Patient: Adam Cabrera WUJ:[id number] DOB: 08-Dec-1985 DOA: 03/23/2023     27 DOS: the patient was seen and examined on 04/19/2023   Brief hospital course: 37 y.o. male with medical history significant for hypertension, scrotal cellulitis, history of IV drug use, presents with low back pain.  MRI of the lumbar spine showed Epidural extension with epidural phlegmon/abscess within the ventral epidural space extending from L1-2 through L3-4.  Blood culture positive for Staph aureus, patient treated with cefepime and vancomycin.  Patient has been seen by ID and neurosurgery, L spine washout performed 8/19. Patient condition had improved up to 8/26, wound VAC removed.  Patient will need 6 weeks of IV antibiotics.  Per TOC, there is no placement option.  Patient currently on on continuous nafcillin infusion.  Patient had a worsening back pain since 9/3, Patient was brought to the OR again 9/5. Had: 1. Transforaminal Lumbar Interbody Fusion L2-3.  2. Posterolateral arthrodesis L1 to L4 3. Posterior segmental instrumentation L1 to L4; 4.  Lumbar decompression with total facetectomy on the right at L2-3;  5.  Allograft placement in the 2 3 disc space for arthrodesis.  One of the wound cultures from 9/5 growing rare Staph aureus.    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  On 9/5, patient had a another neurosurgical procedure which included transforaminal lumbar interbody fusion of L2/L3, posterolateral arthrodesis L1-L4, posterior segment instrumentation L1-L4, lumbar decompression with total fasciotomy on the right at L2-L3, allograft placement in the 2/3 disc space for arthrodesis.  Patient on continuous nafcillin.  Continue rifampin.  ID recommends 6 weeks total of IV antibiotics.  Since one of the cultures from 9/5 growing Staph aureus may have to be 6 weeks of antibiotics from that  culture.    MSSA (methicillin susceptible Staphylococcus aureus) septicemia (HCC) Present on admission with tachycardia, tachypnea and epidural abscess.  Continue continuous IV nafcillin and rifampin.  Osteomyelitis (HCC) Discitis and osteomyelitis of L2-L3.  Continue IV nafcillin and rifampin.  Psoas abscess, left (HCC) Continue IV nafcillin and rifampin.  Thrombocytopenia (HCC) Improved.  Last platelet count 133  Fresh blood passed per rectum Anusol suppository.  Obesity (BMI 30-39.9) BMI 32.55   Hypokalemia Replaced  Iron deficiency anemia Last hemoglobin 8.1.  Combination of iron deficiency anemia and postoperative anemia.  Patient on oral iron.  Likely hemorrhoids also contributing to anemia.  Anusol suppository.  May end up needing a blood transfusion if hemoglobin drops further.  Dry skin Will give Lac-Hydrin and antifungal to bilateral feet.  Headache As needed Fioricet.  Hyponatremia Resolved  Back pain Continue with pain control.        Subjective: Patient still has some back pain and some soreness.  Continues to move his right leg well and did well with physical therapy yesterday.  Admitted with Staph aureus sepsis, epidural abscess and osteomyelitis.  Physical Exam: Vitals:   04/18/23 0754 04/18/23 1548 04/18/23 2358 04/19/23 0811  BP: 118/72 130/71 123/80 120/83  Pulse: 81 87 86 77  Resp: 17 16 18 16   Temp: 97.9 F (36.6 C) 98.2 F (36.8 C) 98.2 F (36.8 C) 97.8 F (36.6 C)  TempSrc:   Oral   SpO2: 100% 100% 99% 100%  Weight:      Height:       Physical Exam HENT:     Head: Normocephalic.     Mouth/Throat:     Pharynx:  No oropharyngeal exudate.  Eyes:     General: Lids are normal.     Conjunctiva/sclera: Conjunctivae normal.  Cardiovascular:     Rate and Rhythm: Normal rate and regular rhythm.     Heart sounds: Normal heart sounds, S1 normal and S2 normal.  Pulmonary:     Breath sounds: No decreased breath sounds, wheezing,  rhonchi or rales.  Abdominal:     Palpations: Abdomen is soft.     Tenderness: There is no abdominal tenderness.  Musculoskeletal:     Right lower leg: Swelling present.     Left lower leg: Swelling present.  Skin:    General: Skin is warm.     Comments: Dried scaling bilateral feet.    Neurological:     Mental Status: He is alert and oriented to person, place, and time.     Comments: Patient able to straight leg raise bilaterally.     Data Reviewed: Hemoglobin 8.1, potassium 3.6, magnesium 1.9 and phosphorus 3.8  Disposition: Status is: Inpatient Remains inpatient appropriate because: Will need 6 weeks of IV antibiotics.  Planned Discharge Destination: Home with Home Health    Time spent: 27 minutes  Author: Alford Highland, MD 04/19/2023 2:46 PM  For on call review [email or web address].

## 2023-04-19 NOTE — Progress Notes (Signed)
Mobility Specialist - Progress Note   04/19/23 1056  Mobility  Activity Ambulated with assistance in room;Ambulated with assistance to bathroom;Stood at bedside;Dangled on edge of bed  Level of Assistance Standby assist, set-up cues, supervision of patient - no hands on  Assistive Device Front wheel walker  Distance Ambulated (ft) 520 ft  Activity Response Tolerated well  Mobility Referral Yes  $Mobility charge 1 Mobility  Mobility Specialist Start Time (ACUTE ONLY) 1021  Mobility Specialist Stop Time (ACUTE ONLY) 1049  Mobility Specialist Time Calculation (min) (ACUTE ONLY) 28 min   Pt supine in bed on RA upon arrival. Pt completes bed mobility, STS from heightened bed height, and ambulates to/from bathroom and in hallway at Supervision level. Pt has two small LOB but able to correct indep. Pt returns to bed with needs in reach and alarm set.   Terrilyn Saver  Mobility Specialist  04/19/23 10:57 AM

## 2023-04-20 DIAGNOSIS — K6812 Psoas muscle abscess: Secondary | ICD-10-CM | POA: Diagnosis not present

## 2023-04-20 DIAGNOSIS — M8600 Acute hematogenous osteomyelitis, unspecified site: Secondary | ICD-10-CM | POA: Diagnosis not present

## 2023-04-20 DIAGNOSIS — G061 Intraspinal abscess and granuloma: Secondary | ICD-10-CM | POA: Diagnosis not present

## 2023-04-20 DIAGNOSIS — A4101 Sepsis due to Methicillin susceptible Staphylococcus aureus: Secondary | ICD-10-CM | POA: Diagnosis not present

## 2023-04-20 MED ORDER — MORPHINE SULFATE ER 15 MG PO TBCR
15.0000 mg | EXTENDED_RELEASE_TABLET | Freq: Two times a day (BID) | ORAL | Status: DC
  Administered 2023-04-20 – 2023-05-13 (×47): 15 mg via ORAL
  Filled 2023-04-20 (×47): qty 1

## 2023-04-20 NOTE — Progress Notes (Signed)
Mobility Specialist - Progress Note   04/20/23 1143  Mobility  Activity Ambulated with assistance in hallway;Stood at bedside;Dangled on edge of bed  Level of Assistance Standby assist, set-up cues, supervision of patient - no hands on  Assistive Device Front wheel walker  Distance Ambulated (ft) 640 ft  Activity Response Tolerated well  Mobility Referral Yes  $Mobility charge 1 Mobility  Mobility Specialist Start Time (ACUTE ONLY) 1021  Mobility Specialist Stop Time (ACUTE ONLY) 1044  Mobility Specialist Time Calculation (min) (ACUTE ONLY) 23 min   Pt supine in bed on RA upon arrival. Pt completes bed mobility indep. Pt assisted with donning TLSO brace. Pt STS and ambulates 4 laps around NS SBA. Pt returns to bed with needs in reach and bed alarm activated.   Terrilyn Saver  Mobility Specialist  04/20/23 11:45 AM

## 2023-04-20 NOTE — Progress Notes (Signed)
Progress Note   Patient: Adam Cabrera JXB:[id number] DOB: 07-27-1986 DOA: 03/23/2023     28 DOS: the patient was seen and examined on 04/20/2023   Brief hospital course: 37 y.o. male with medical history significant for hypertension, scrotal cellulitis, history of IV drug use, presents with low back pain.  MRI of the lumbar spine showed Epidural extension with epidural phlegmon/abscess within the ventral epidural space extending from L1-2 through L3-4.  Blood culture positive for Staph aureus, patient treated with cefepime and vancomycin.  Patient has been seen by ID and neurosurgery, L spine washout performed 8/19. Patient condition had improved up to 8/26, wound VAC removed.  Patient will need 6 weeks of IV antibiotics.  Per TOC, there is no placement option.  Patient currently on on continuous nafcillin infusion.  Patient had a worsening back pain since 9/3, Patient was brought to the OR again 9/5. Had: 1. Transforaminal Lumbar Interbody Fusion L2-3.  2. Posterolateral arthrodesis L1 to L4 3. Posterior segmental instrumentation L1 to L4; 4.  Lumbar decompression with total facetectomy on the right at L2-3;  5.  Allograft placement in the 2 3 disc space for arthrodesis.  One of the wound cultures from 9/5 growing rare Staph aureus.    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  On 9/5, patient had a another neurosurgical procedure which included transforaminal lumbar interbody fusion of L2/L3, posterolateral arthrodesis L1-L4, posterior segment instrumentation L1-L4, lumbar decompression with total fasciotomy on the right at L2-L3, allograft placement in the 2/3 disc space for arthrodesis.  Patient on continuous nafcillin.  Continue rifampin.  ID recommends 6 weeks total of IV antibiotics.  Since one of the cultures from 9/5 growing Staph aureus may have to be 6 weeks of antibiotics from that  culture.    MSSA (methicillin susceptible Staphylococcus aureus) septicemia (HCC) Present on admission with tachycardia, tachypnea and epidural abscess.  Continue continuous IV nafcillin and rifampin.  Osteomyelitis (HCC) Discitis and osteomyelitis of L2-L3.  Continue IV nafcillin and rifampin.  Psoas abscess, left (HCC) Continue IV nafcillin and rifampin.  Thrombocytopenia (HCC) Improved.  Last platelet count 133  Fresh blood passed per rectum Anusol suppository.  Obesity (BMI 30-39.9) BMI 32.98   Hypokalemia Replaced  Iron deficiency anemia Last hemoglobin 8.1.  Combination of iron deficiency anemia and postoperative anemia.  Patient on oral iron.  Likely hemorrhoids also contributing to anemia.  Anusol suppository.  May end up needing a blood transfusion if hemoglobin drops further.  Dry skin Will give Lac-Hydrin and antifungal to bilateral feet.  Headache As needed Fioricet.  Hyponatremia Resolved  Back pain Will add MS Contin 15 mg twice a day.  Continue oxycodone.  Patient on as needed IV Dilaudid hopefully we can decrease the use of IV Dilaudid by using MS Contin.        Subjective: Patient stated he walked around and did a lot today and having some back pain.  Feels some numbness in his feet.  Asking for IV pain medication.  Physical Exam: Vitals:   04/19/23 1738 04/20/23 0043 04/20/23 0500 04/20/23 0745  BP: 127/78 129/82  118/75  Pulse: 90 86  75  Resp: 17 16  18   Temp: 98.6 F (37 C) 98.4 F (36.9 C)  98 F (36.7 C)  TempSrc:      SpO2: 100% 97%  100%  Weight:   116.5 kg   Height:  Physical Exam HENT:     Head: Normocephalic.     Mouth/Throat:     Pharynx: No oropharyngeal exudate.  Eyes:     General: Lids are normal.     Conjunctiva/sclera: Conjunctivae normal.  Cardiovascular:     Rate and Rhythm: Normal rate and regular rhythm.     Heart sounds: Normal heart sounds, S1 normal and S2 normal.  Pulmonary:     Breath sounds: No  decreased breath sounds, wheezing, rhonchi or rales.  Abdominal:     Palpations: Abdomen is soft.     Tenderness: There is no abdominal tenderness.  Musculoskeletal:     Right lower leg: Swelling present.     Left lower leg: Swelling present.  Skin:    General: Skin is warm.     Comments: Dried scaling bilateral feet.    Neurological:     Mental Status: He is alert and oriented to person, place, and time.     Comments: Patient able to straight leg raise bilaterally.     Data Reviewed: Hemoglobin 8.1, magnesium 1.9, phosphorus 3.8  Disposition: Status is: Inpatient Remains inpatient appropriate because: Will need 6 weeks worth of IV antibiotics  Planned Discharge Destination: Home    Time spent: 27 minutes  Author: Alford Highland, MD 04/20/2023 1:46 PM  For on call review [email or web address].

## 2023-04-21 DIAGNOSIS — E669 Obesity, unspecified: Secondary | ICD-10-CM | POA: Diagnosis not present

## 2023-04-21 DIAGNOSIS — R7881 Bacteremia: Secondary | ICD-10-CM | POA: Diagnosis not present

## 2023-04-21 DIAGNOSIS — G061 Intraspinal abscess and granuloma: Secondary | ICD-10-CM | POA: Diagnosis not present

## 2023-04-21 DIAGNOSIS — K6812 Psoas muscle abscess: Secondary | ICD-10-CM | POA: Diagnosis not present

## 2023-04-21 DIAGNOSIS — M8600 Acute hematogenous osteomyelitis, unspecified site: Secondary | ICD-10-CM | POA: Diagnosis not present

## 2023-04-21 DIAGNOSIS — A419 Sepsis, unspecified organism: Secondary | ICD-10-CM | POA: Diagnosis not present

## 2023-04-21 DIAGNOSIS — D696 Thrombocytopenia, unspecified: Secondary | ICD-10-CM | POA: Diagnosis not present

## 2023-04-21 LAB — COMPREHENSIVE METABOLIC PANEL
ALT: 11 U/L (ref 0–44)
AST: 17 U/L (ref 15–41)
Albumin: 2.9 g/dL — ABNORMAL LOW (ref 3.5–5.0)
Alkaline Phosphatase: 52 U/L (ref 38–126)
Anion gap: 5 (ref 5–15)
BUN: 8 mg/dL (ref 6–20)
CO2: 26 mmol/L (ref 22–32)
Calcium: 8.4 mg/dL — ABNORMAL LOW (ref 8.9–10.3)
Chloride: 104 mmol/L (ref 98–111)
Creatinine, Ser: 0.58 mg/dL — ABNORMAL LOW (ref 0.61–1.24)
GFR, Estimated: >60 mL/min (ref >60)
Glucose, Bld: 99 mg/dL (ref 70–99)
Potassium: 4 mmol/L (ref 3.5–5.1)
Sodium: 135 mmol/L (ref 135–145)
Total Bilirubin: 1.5 mg/dL — ABNORMAL HIGH (ref 0.3–1.2)
Total Protein: 6.4 g/dL — ABNORMAL LOW (ref 6.5–8.1)

## 2023-04-21 LAB — CBC
HCT: 28.7 % — ABNORMAL LOW (ref 39.0–52.0)
Hemoglobin: 8.6 g/dL — ABNORMAL LOW (ref 13.0–17.0)
MCH: 28.6 pg (ref 26.0–34.0)
MCHC: 30 g/dL (ref 30.0–36.0)
MCV: 95.3 fL (ref 80.0–100.0)
Platelets: 111 10*3/uL — ABNORMAL LOW (ref 150–400)
RBC: 3.01 MIL/uL — ABNORMAL LOW (ref 4.22–5.81)
RDW: 21.6 % — ABNORMAL HIGH (ref 11.5–15.5)
WBC: 3.7 10*3/uL — ABNORMAL LOW (ref 4.0–10.5)
nRBC: 0 % (ref 0.0–0.2)

## 2023-04-21 NOTE — Progress Notes (Signed)
Subjective: No new complaints   Antibiotics:  Anti-infectives (From admission, onward)    Start     Dose/Rate Route Frequency Ordered Stop   04/16/23 1400  rifampin (RIFADIN) capsule 300 mg        300 mg Oral Every 12 hours 04/16/23 1234     04/10/23 1703  vancomycin (VANCOCIN) powder  Status:  Discontinued          As needed 04/10/23 1703 04/10/23 1820   03/26/23 1800  nafcillin 12 g in sodium chloride 0.9 % 500 mL continuous infusion        12 g 20.8 mL/hr over 24 Hours Intravenous Every 24 hours 03/26/23 1553     03/25/23 2200  nafcillin 6 g in sodium chloride 0.9 % 50 mL IVPB  Status:  Discontinued        6 g 148 mL/hr over 30 Minutes Intravenous Every 12 hours 03/25/23 1808 03/26/23 1553   03/25/23 1900  nafcillin 2 g in sodium chloride 0.9 % 100 mL IVPB        2 g 216 mL/hr over 30 Minutes Intravenous  Once 03/25/23 1808 03/25/23 2031   03/24/23 2200  ceFAZolin (ANCEF) IVPB 2g/100 mL premix  Status:  Discontinued        2 g 200 mL/hr over 30 Minutes Intravenous Every 8 hours 03/24/23 0955 03/25/23 1808   03/24/23 1618  vancomycin (VANCOCIN) powder  Status:  Discontinued          As needed 03/24/23 1618 03/24/23 1633   03/24/23 0700  vancomycin (VANCOREADY) IVPB 1750 mg/350 mL  Status:  Discontinued        1,750 mg 175 mL/hr over 120 Minutes Intravenous Every 12 hours 03/23/23 2353 03/24/23 0955   03/24/23 0600  metroNIDAZOLE (FLAGYL) IVPB 500 mg  Status:  Discontinued        500 mg 100 mL/hr over 60 Minutes Intravenous Every 12 hours 03/23/23 2205 03/24/23 0955   03/24/23 0100  ceFEPIme (MAXIPIME) 2 g in sodium chloride 0.9 % 100 mL IVPB  Status:  Discontinued        2 g 200 mL/hr over 30 Minutes Intravenous Every 8 hours 03/23/23 2351 03/24/23 0955   03/23/23 2215  vancomycin (VANCOREADY) IVPB 500 mg/100 mL        500 mg 100 mL/hr over 60 Minutes Intravenous  Once 03/23/23 2214 03/23/23 2335   03/23/23 1700  ceFEPIme (MAXIPIME) 2 g in sodium chloride 0.9 %  100 mL IVPB        2 g 200 mL/hr over 30 Minutes Intravenous  Once 03/23/23 1647 03/23/23 1805   03/23/23 1700  metroNIDAZOLE (FLAGYL) IVPB 500 mg        500 mg 100 mL/hr over 60 Minutes Intravenous  Once 03/23/23 1647 03/23/23 1906   03/23/23 1700  vancomycin (VANCOCIN) IVPB 1000 mg/200 mL premix  Status:  Discontinued        1,000 mg 200 mL/hr over 60 Minutes Intravenous  Once 03/23/23 1647 03/23/23 1649   03/23/23 1700  vancomycin (VANCOREADY) IVPB 2000 mg/400 mL        2,000 mg 200 mL/hr over 120 Minutes Intravenous  Once 03/23/23 1649 03/23/23 2150       Medications: Scheduled Meds:  acetaminophen  1,000 mg Oral Q6H   ammonium lactate   Topical BID   docusate sodium  100 mg Oral BID   enoxaparin (LOVENOX) injection  40 mg Subcutaneous Q24H   ferrous  sulfate  325 mg Oral Q breakfast   gabapentin  300 mg Oral TID   hydrocortisone  25 mg Rectal TID   methocarbamol  750 mg Oral Q6H   morphine  15 mg Oral Q12H   nicotine  21 mg Transdermal Daily   rifampin  300 mg Oral Q12H   senna-docusate  2 tablet Oral BID   sodium chloride flush  3 mL Intravenous Q12H   sodium chloride flush  3 mL Intravenous Q12H   terbinafine   Topical BID   traZODone  50 mg Oral QHS   Continuous Infusions:  sodium chloride     nafcillin 12 g in sodium chloride 0.9 % 500 mL continuous infusion 12 g (04/20/23 2130)   PRN Meds:.bisacodyl, butalbital-acetaminophen-caffeine, HYDROmorphone (DILAUDID) injection, LORazepam, magnesium citrate, ondansetron **OR** ondansetron (ZOFRAN) IV, mouth rinse, oxyCODONE, oxyCODONE, polyethylene glycol, senna, sodium chloride flush    Objective: Weight change:   Intake/Output Summary (Last 24 hours) at 04/21/2023 1242 Last data filed at 04/21/2023 0900 Gross per 24 hour  Intake 1145.54 ml  Output 2100 ml  Net -954.46 ml   Blood pressure 126/84, pulse 81, temperature 98.7 F (37.1 C), resp. rate 18, height 6\' 2"  (1.88 m), weight 116.5 kg, SpO2 98%. Temp:  [98.2  F (36.8 C)-98.7 F (37.1 C)] 98.7 F (37.1 C) (09/16 0959) Pulse Rate:  [81-85] 81 (09/16 0959) Resp:  [16-20] 18 (09/16 0959) BP: (118-130)/(68-84) 126/84 (09/16 0959) SpO2:  [98 %-100 %] 98 % (09/16 0959)  Physical Exam: Physical Exam Constitutional:      Appearance: He is well-developed.  HENT:     Head: Normocephalic and atraumatic.  Eyes:     Conjunctiva/sclera: Conjunctivae normal.  Cardiovascular:     Rate and Rhythm: Normal rate and regular rhythm.  Pulmonary:     Effort: Pulmonary effort is normal. No respiratory distress.     Breath sounds: No wheezing.  Abdominal:     General: There is no distension.     Palpations: Abdomen is soft.  Musculoskeletal:        General: Normal range of motion.     Cervical back: Normal range of motion and neck supple.  Skin:    General: Skin is warm and dry.     Findings: No erythema or rash.  Neurological:     General: No focal deficit present.     Mental Status: He is alert and oriented to person, place, and time.  Psychiatric:        Mood and Affect: Mood normal.        Behavior: Behavior normal.        Thought Content: Thought content normal.        Judgment: Judgment normal.      CBC:    BMET Recent Labs    04/19/23 0457 04/21/23 0251  NA  --  135  K 3.6 4.0  CL  --  104  CO2  --  26  GLUCOSE  --  99  BUN  --  8  CREATININE  --  0.58*  CALCIUM  --  8.4*     Liver Panel  Recent Labs    04/21/23 0251  PROT 6.4*  ALBUMIN 2.9*  AST 17  ALT 11  ALKPHOS 52  BILITOT 1.5*       Sedimentation Rate No results for input(s): "ESRSEDRATE" in the last 72 hours. C-Reactive Protein No results for input(s): "CRP" in the last 72 hours.  Micro Results: Recent Results (from the  past 720 hour(s))  Culture, blood (routine x 2)     Status: Abnormal   Collection Time: 03/23/23  4:15 PM   Specimen: BLOOD  Result Value Ref Range Status   Specimen Description   Final    BLOOD BLOOD LEFT ARM Performed at  [facility] [place] [facility], [address] [place], [place] [address]    Special Requests   Final    BOTTLES DRAWN AEROBIC AND ANAEROBIC Blood Culture adequate volume Performed at [facility], [address] [place], [place] [address]    Culture  Setup Time   Final    GRAM POSITIVE COCCI IN BOTH AEROBIC AND ANAEROBIC BOTTLES CRITICAL RESULT CALLED TO, READ BACK BY AND VERIFIED WITH: Marisue Brooklyn 03/24/23 0981 MW Performed at [facility], [address], [place], [place] [address]    Culture STAPHYLOCOCCUS AUREUS (A)  Final   Report Status 03/26/2023 FINAL  Final   Organism ID, Bacteria STAPHYLOCOCCUS AUREUS  Final      Susceptibility   Staphylococcus aureus - MIC*    CIPROFLOXACIN <=0.5 SENSITIVE Sensitive     ERYTHROMYCIN <=0.25 SENSITIVE Sensitive     GENTAMICIN <=0.5 SENSITIVE Sensitive     OXACILLIN 0.5 SENSITIVE Sensitive     TETRACYCLINE <=1 SENSITIVE Sensitive     VANCOMYCIN <=0.5 SENSITIVE Sensitive     TRIMETH/SULFA <=10 SENSITIVE Sensitive     CLINDAMYCIN <=0.25 SENSITIVE Sensitive     RIFAMPIN <=0.5 SENSITIVE Sensitive     Inducible Clindamycin NEGATIVE Sensitive     LINEZOLID 2 SENSITIVE Sensitive     * STAPHYLOCOCCUS AUREUS  Blood Culture ID Panel (Reflexed)     Status: Abnormal   Collection Time: 03/23/23  4:15 PM  Result Value Ref Range Status   Enterococcus faecalis NOT DETECTED NOT DETECTED Final   Enterococcus Faecium NOT DETECTED NOT DETECTED Final   Listeria monocytogenes NOT DETECTED NOT DETECTED Final   Staphylococcus species DETECTED (A) NOT DETECTED Final    Comment: CRITICAL RESULT CALLED TO, READ BACK BY AND VERIFIED WITH: DEVIN MITCHELL 03/24/23 0811 MW    Staphylococcus aureus (BCID) DETECTED (A) NOT DETECTED Final    Comment: CRITICAL RESULT CALLED TO, READ BACK BY AND VERIFIED WITH: DEVIN MITCHELL 03/24/23 0811 MW    Staphylococcus epidermidis NOT DETECTED NOT DETECTED Final   Staphylococcus lugdunensis NOT DETECTED NOT DETECTED  Final   Streptococcus species NOT DETECTED NOT DETECTED Final   Streptococcus agalactiae NOT DETECTED NOT DETECTED Final   Streptococcus pneumoniae NOT DETECTED NOT DETECTED Final   Streptococcus pyogenes NOT DETECTED NOT DETECTED Final   A.calcoaceticus-baumannii NOT DETECTED NOT DETECTED Final   Bacteroides fragilis NOT DETECTED NOT DETECTED Final   Enterobacterales NOT DETECTED NOT DETECTED Final   Enterobacter cloacae complex NOT DETECTED NOT DETECTED Final   Escherichia coli NOT DETECTED NOT DETECTED Final   Klebsiella aerogenes NOT DETECTED NOT DETECTED Final   Klebsiella oxytoca NOT DETECTED NOT DETECTED Final   Klebsiella pneumoniae NOT DETECTED NOT DETECTED Final   Proteus species NOT DETECTED NOT DETECTED Final   Salmonella species NOT DETECTED NOT DETECTED Final   Serratia marcescens NOT DETECTED NOT DETECTED Final   Haemophilus influenzae NOT DETECTED NOT DETECTED Final   Neisseria meningitidis NOT DETECTED NOT DETECTED Final   Pseudomonas aeruginosa NOT DETECTED NOT DETECTED Final   Stenotrophomonas maltophilia NOT DETECTED NOT DETECTED Final   Candida albicans NOT DETECTED NOT DETECTED Final   Candida auris NOT DETECTED NOT DETECTED Final   Candida glabrata NOT DETECTED NOT  DETECTED Final   Candida krusei NOT DETECTED NOT DETECTED Final   Candida parapsilosis NOT DETECTED NOT DETECTED Final   Candida tropicalis NOT DETECTED NOT DETECTED Final   Cryptococcus neoformans/gattii NOT DETECTED NOT DETECTED Final   Meth resistant mecA/C and MREJ NOT DETECTED NOT DETECTED Final    Comment: Performed at [facility], [address] [place], [place] [address]  Culture, blood (routine x 2)     Status: Abnormal   Collection Time: 03/23/23  4:51 PM   Specimen: BLOOD  Result Value Ref Range Status   Specimen Description   Final    BLOOD RIGHT ANTECUBITAL Performed at [facility], [address], [place], [place] [address]    Special Requests   Final     BOTTLES DRAWN AEROBIC AND ANAEROBIC Blood Culture adequate volume Performed at [facility], [address] [place], [place] [address]    Culture  Setup Time   Final    GRAM POSITIVE COCCI ANAEROBIC BOTTLE ONLY CRITICAL RESULT CALLED TO, READ BACK BY AND VERIFIED WITH: [facility] MITCHELL 03/24/23 0811 MW CRITICAL VALUE NOTED.  VALUE IS CONSISTENT WITH PREVIOUSLY REPORTED AND CALLED VALUE.    Culture (A)  Final    STAPHYLOCOCCUS AUREUS SUSCEPTIBILITIES PERFORMED ON PREVIOUS CULTURE WITHIN THE LAST 5 DAYS. Performed at [facility], [address], [place], [place] [address]    Report Status 03/26/2023 FINAL  Final  Surgical PCR screen     Status: Abnormal   Collection Time: 03/24/23  8:37 AM   Specimen: Nasal Mucosa; Nasal Swab  Result Value Ref Range Status   MRSA, PCR NEGATIVE NEGATIVE Final   Staphylococcus aureus POSITIVE (A) NEGATIVE Final    Comment: (NOTE) The Xpert SA Assay (FDA approved for NASAL specimens in patients 38 years of age and older), is one component of a comprehensive surveillance program. It is not intended to diagnose infection nor to guide or monitor treatment. Performed at [facility], [address] [place], [place] [address]   Aerobic/Anaerobic Culture w Gram Stain (surgical/deep wound)     Status: None   Collection Time: 03/24/23  6:30 PM   Specimen: Abscess  Result Value Ref Range Status   Specimen Description ABSCESS  Final   Special Requests INVETREBAL DISC  Final   Gram Stain NO WBC SEEN RARE GRAM POSITIVE COCCI   Final   Culture   Final    ABUNDANT STAPHYLOCOCCUS AUREUS NO ANAEROBES ISOLATED Performed at [facility], [address] [place], [place] [address]    Report Status 03/29/2023 FINAL  Final   Organism ID, Bacteria STAPHYLOCOCCUS AUREUS  Final      Susceptibility   Staphylococcus aureus - MIC*    CIPROFLOXACIN <=0.5 SENSITIVE Sensitive     ERYTHROMYCIN <=0.25 SENSITIVE Sensitive     GENTAMICIN  <=0.5 SENSITIVE Sensitive     OXACILLIN 0.5 SENSITIVE Sensitive     TETRACYCLINE <=1 SENSITIVE Sensitive     VANCOMYCIN <=0.5 SENSITIVE Sensitive     TRIMETH/SULFA <=10 SENSITIVE Sensitive     CLINDAMYCIN <=0.25 SENSITIVE Sensitive     RIFAMPIN <=0.5 SENSITIVE Sensitive     Inducible Clindamycin NEGATIVE Sensitive     LINEZOLID 2 SENSITIVE Sensitive     * ABUNDANT STAPHYLOCOCCUS AUREUS  Culture, blood (Routine X 2) w Reflex to ID Panel     Status: None   Collection Time: 03/26/23  4:55 AM   Specimen: BLOOD LEFT HAND  Result Value Ref Range Status  Specimen Description BLOOD LEFT HAND  Final   Special Requests   Final    BOTTLES DRAWN AEROBIC AND ANAEROBIC Blood Culture results may not be optimal due to an excessive volume of blood received in culture bottles   Culture   Final    NO GROWTH 5 DAYS Performed at [facility], [address] [place], [place] [address]    Report Status 03/31/2023 FINAL  Final  Culture, blood (Routine X 2) w Reflex to ID Panel     Status: None   Collection Time: 03/26/23  5:03 AM   Specimen: BLOOD RIGHT ARM  Result Value Ref Range Status   Specimen Description BLOOD RIGHT ARM  Final   Special Requests   Final    BOTTLES DRAWN AEROBIC AND ANAEROBIC Blood Culture results may not be optimal due to an excessive volume of blood received in culture bottles   Culture   Final    NO GROWTH 5 DAYS Performed at [facility], [address], [place], [place] [address]    Report Status 03/31/2023 FINAL  Final  Aerobic/Anaerobic Culture w Gram Stain (surgical/deep wound)     Status: None   Collection Time: 04/10/23  3:07 PM   Specimen: Path Tissue  Result Value Ref Range Status   Specimen Description   Final    TISSUE Performed at [facility], [address] [place], [place] [address]    Special Requests   Final    SUBFASCIAL CULTURE Performed at [facility], [address] [place], [place] [address]    Gram  Stain NO WBC SEEN NO ORGANISMS SEEN   Final   Culture   Final    No growth aerobically or anaerobically. Performed at [facility], [address] [place], [place] [address]    Report Status 04/16/2023 FINAL  Final  Aerobic/Anaerobic Culture w Gram Stain (surgical/deep wound)     Status: None   Collection Time: 04/10/23  3:14 PM   Specimen: Wound; Tissue  Result Value Ref Range Status   Specimen Description   Final    WOUND Performed at [facility], [address] [place], [place] [address]    Special Requests   Final    SUBFASCIAL TISSUE CULTURE Performed at [facility], [address] [place], [place] [address]    Gram Stain NO WBC SEEN NO ORGANISMS SEEN   Final   Culture   Final    No growth aerobically or anaerobically. Performed at [facility], [address], [place], [place] [address]    Report Status 04/16/2023 FINAL  Final  Aerobic/Anaerobic Culture w Gram Stain (surgical/deep wound)     Status: None   Collection Time: 04/10/23  4:42 PM   Specimen: Wound; Tissue  Result Value Ref Range Status   Specimen Description   Final    WOUND Performed at [facility], [address] [place], [place] [address]    Special Requests   Final    DISC SPACE CULTURE Performed at [facility], [address] [place], [place] [address]    Gram Stain NO WBC SEEN NO ORGANISMS SEEN   Final   Culture   Final    No growth aerobically or anaerobically. Performed at [facility], [address], [place], [place] [address]    Report Status 04/16/2023 FINAL  Final  Aerobic/Anaerobic Culture w Gram Stain (surgical/deep wound)     Status: None   Collection  Time: 04/10/23  5:04 PM   Specimen: Path Tissue  Result Value Ref Range Status   Specimen Description   Final    TISSUE Performed at [facility], [address] [place], [place] [address]    Special Requests   Final    DISC SPACE 2 Performed at  [facility], [address] [place], [place] [address]    Gram Stain NO WBC SEEN NO ORGANISMS SEEN   Final   Culture   Final    RARE STAPHYLOCOCCUS AUREUS NO ANAEROBES ISOLATED Performed at [facility], [address] [place], [place] [address]    Report Status 04/16/2023 FINAL  Final   Organism ID, Bacteria STAPHYLOCOCCUS AUREUS  Final      Susceptibility   Staphylococcus aureus - MIC*    CIPROFLOXACIN <=0.5 SENSITIVE Sensitive     ERYTHROMYCIN <=0.25 SENSITIVE Sensitive     GENTAMICIN <=0.5 SENSITIVE Sensitive     OXACILLIN 0.5 SENSITIVE Sensitive     TETRACYCLINE <=1 SENSITIVE Sensitive     VANCOMYCIN 1 SENSITIVE Sensitive     TRIMETH/SULFA <=10 SENSITIVE Sensitive     CLINDAMYCIN <=0.25 SENSITIVE Sensitive     RIFAMPIN <=0.5 SENSITIVE Sensitive     Inducible Clindamycin NEGATIVE Sensitive     LINEZOLID 2 SENSITIVE Sensitive     * RARE STAPHYLOCOCCUS AUREUS    Studies/Results: No results found.    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 male with history of prior IV drug use, chronic hepatitis C without hepatic coma ( with F2/F3 scoring by FibroSure  ar [facility], CT scan [place] [address] suggesting hepatomegaly   #1  Lumbar epidural abscess status post evacuation and washout on 19 August and then again on the fifth  with placement of hardware.  He is currently on nafcillin along with rifampin.  He does not have evidence of a spinal fluid leak or an extension of infection into the brain.  I think that he should not need nafcillin for the entirety of his treatment.  I am personally a bit of a nihilist re rifampin but will defer to Dr. Rivka Safer when she gets back  #2  Chronic hepatitis C without hepatic coma: Rechecking hep C viral load.  Note the patient was seen by Dr. Casimiro Needle freed at [facility] hepatology in 2021 at that point in time his FibroSure had suggested F2/F3.  There was mention of hepatomegaly on CT abdomen pelvis performed in 2023.  I noted the patient has had thrombocytopenia going back to 2020 weight 1 which I expect is from his hepatocellular disease  Was not immune to hep A or B. Bell recheck those antibodies as well as surface antigen for hepatitis B and a right upper quadrant ultrasound to reevaluate his liver.  Him on the risk of  hepatotoxins including his untreated hepatitis C and alcohol though he minimizes the use of the latter.    #3 Ttpenia: chronic and would not be related to this antibiotics  I have personally spent 52 minutes involved in face-to-face and non-face-to-face activities for this patient on the day of the visit. Professional time spent includes the following activities: Preparing to see the patient (review of tests), Obtaining and/or reviewing separately obtained history (admission/discharge record), Performing a medically appropriate examination and/or evaluation , Ordering medications/tests/procedures, referring and communicating with other health care professionals, Documenting clinical information in the EMR, Independently interpreting results (not separately reported), Communicating results to the patient/family/caregiver, Counseling and educating the patient/family/caregiver and Care coordination (not separately reported).   Dr. Rivka Safer will take back over  tomorrow.   LOS: 29 days   Acey Lav 04/21/2023, 12:42 PM

## 2023-04-21 NOTE — Progress Notes (Signed)
Progress Note   Patient: Adam Cabrera YQM:[id number] DOB: 1986-04-23 DOA: 03/23/2023     29 DOS: the patient was seen and examined on 04/21/2023   Brief hospital course: 37 y.o. male with medical history significant for hypertension, scrotal cellulitis, history of IV drug use, presents with low back pain.  MRI of the lumbar spine showed Epidural extension with epidural phlegmon/abscess within the ventral epidural space extending from L1-2 through L3-4.  Blood culture positive for Staph aureus, patient treated with cefepime and vancomycin.  Patient has been seen by ID and neurosurgery, L spine washout performed 8/19. Patient condition had improved up to 8/26, wound VAC removed.  Patient will need 6 weeks of IV antibiotics.  Per TOC, there is no placement option.  Patient currently on on continuous nafcillin infusion.  Patient had a worsening back pain since 9/3, Patient was brought to the OR again 9/5. Had: 1. Transforaminal Lumbar Interbody Fusion L2-3.  2. Posterolateral arthrodesis L1 to L4 3. Posterior segmental instrumentation L1 to L4; 4.  Lumbar decompression with total facetectomy on the right at L2-3;  5.  Allograft placement in the 2 3 disc space for arthrodesis.  One of the wound cultures from 9/5 growing rare Staph aureus.    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  On 9/5, patient had a another neurosurgical procedure which included transforaminal lumbar interbody fusion of L2/L3, posterolateral arthrodesis L1-L4, posterior segment instrumentation L1-L4, lumbar decompression with total fasciotomy on the right at L2-L3, allograft placement in the 2/3 disc space for arthrodesis.  Patient on continuous nafcillin.  Continue rifampin.  ID recommends 6 weeks total of IV antibiotics.  Since one of the cultures from 9/5 growing Staph aureus may have to be 6 weeks of antibiotics from that  culture.    MSSA (methicillin susceptible Staphylococcus aureus) septicemia (HCC) Present on admission with tachycardia, tachypnea and epidural abscess.  Continue continuous IV nafcillin and rifampin.  Osteomyelitis (HCC) Discitis and osteomyelitis of L2-L3.  Continue IV nafcillin and rifampin.  Psoas abscess, left (HCC) Continue IV nafcillin and rifampin.  Thrombocytopenia (HCC) Last platelet count 111.  Wondering if this is secondary to nafcillin.  Recheck tomorrow.  Hepatitis C viral load and other hepatitis B and hepatitis C profile ordered.  Fresh blood passed per rectum Anusol suppository.  Obesity (BMI 30-39.9) BMI 32.98   Hypokalemia Replaced  Iron deficiency anemia Last hemoglobin 8.6.  Combination of iron deficiency anemia and postoperative anemia.  Patient on oral iron.  Likely hemorrhoids also contributing to anemia.  Anusol suppository as needed.  Dry skin Will give Lac-Hydrin and antifungal to bilateral feet.  Headache As needed Fioricet.  Hyponatremia Resolved  Back pain Continue MS Contin 15 mg twice a day.  Continue oxycodone.  Patient on as needed IV Dilaudid hopefully we can decrease the use of IV Dilaudid by using MS Contin.        Subjective: Patient does complain of some back pain.  Started on long-acting pain medication yesterday.  Patient thinks that this is helping.  Physical Exam: Vitals:   04/20/23 0745 04/20/23 1535 04/20/23 2319 04/21/23 0959  BP: 118/75 118/77 130/68 126/84  Pulse: 75 85 83 81  Resp: 18 20 16 18   Temp: 98 F (36.7 C) 98.3 F (36.8 C) 98.2 F (36.8 C) 98.7 F (37.1 C)  TempSrc:      SpO2: 100% 100% 99% 98%  Weight:  Height:       Physical Exam HENT:     Head: Normocephalic.     Mouth/Throat:     Pharynx: No oropharyngeal exudate.  Eyes:     General: Lids are normal.     Conjunctiva/sclera: Conjunctivae normal.  Cardiovascular:     Rate and Rhythm: Normal rate and regular rhythm.     Heart  sounds: Normal heart sounds, S1 normal and S2 normal.  Pulmonary:     Breath sounds: No decreased breath sounds, wheezing, rhonchi or rales.  Abdominal:     Palpations: Abdomen is soft.     Tenderness: There is no abdominal tenderness.  Musculoskeletal:     Right lower leg: Swelling present.     Left lower leg: Swelling present.  Skin:    General: Skin is warm.     Comments: Dried scaling bilateral feet.    Neurological:     Mental Status: He is alert and oriented to person, place, and time.     Comments: Patient able to straight leg raise bilaterally.     Data Reviewed: Creatinine 0.58, potassium normal range, white blood cell count 3.7, hemoglobin 8.6, platelet count 111  Disposition: Status is: Inpatient Remains inpatient appropriate because: Will need 6 weeks of IV antibiotics.  Will speak with ID physician about when the timing of that 6 weeks would be.  Did have positive wound culture with second surgery on 9/5.  Planned Discharge Destination: Home    Time spent: 27 minutes  Author: Alford Highland, MD 04/21/2023 3:04 PM  For on call review [email or web address].

## 2023-04-21 NOTE — Progress Notes (Signed)
Physical Therapy Treatment Patient Details Name: Adam Cabrera MRN: [id number] DOB: 1986-06-01 Today's Date: 04/21/2023   History of Present Illness 37 y.o. male with medical history significant for hypertension, scrotal cellulitis, history of IV drug use, presents with low back pain.  MRI of the lumbar spine showed Epidural extension with epidural phlegmon/abscess within the ventral epidural space extending from L1-2 through L3-4 and s/p lumbar laminectomy; Pt is now s/p L1-4 PSF with right L3 facetectomy 04/10/23.    PT Comments  Pt received in bed, able to transfer to EOB with use of rail and log roll technique. MinA to don TLSO. Sit to stand from bed at [facility] with S/CGA on second attempt. Good tolerance for distance gait training on level surface. 8/10 c/o pain despite being pre-medicated. Pt given B LE standing HEP and reviewed. Encouraged pt to complete daily. Pt is also on Mobility list. Will continue to progress as pain level allows. Pt remains motivated to participate.   If plan is discharge home, recommend the following: Assistance with cooking/housework;Assist for transportation;Help with stairs or ramp for entrance;A little help with walking and/or transfers;A little help with bathing/dressing/bathroom   Can travel by private vehicle        Equipment Recommendations  Other (comment)    Recommendations for Other Services       Precautions / Restrictions Precautions Precautions: Back;Fall Required Braces or Orthoses: Spinal Brace Spinal Brace: Thoracolumbosacral orthotic Spinal Brace Comments:  (Pt able to don with MinA to initiate) Restrictions Weight Bearing Restrictions: No     Mobility  Bed Mobility Overal bed mobility: Modified Independent Bed Mobility: Supine to Sit, Sit to Supine Rolling: Supervision, Used rails, Modified independent (Device/Increase time) Sidelying to sit: Supervision Supine to sit: Supervision, HOB elevated Sit to supine: Supervision, HOB  elevated Sit to sidelying: HOB elevated, Used rails, Supervision General bed mobility comments: Indep brought legs onto bed    Transfers Overall transfer level: Needs assistance Equipment used: Rolling walker (2 wheels) Transfers: Sit to/from Stand Sit to Stand: From elevated surface, Supervision           General transfer comment:  (Pt able to stand on second attempt)    Ambulation/Gait Ambulation/Gait assistance: Supervision Gait Distance (Feet): 600 Feet Assistive device: Rolling walker (2 wheels) Gait Pattern/deviations: Step-through pattern Gait velocity: WNL     General Gait Details: pt continues to tolerate ambulation and OOB activity better each day. recommended pt get up and ambulate more freqently.   Stairs             Wheelchair Mobility     Tilt Bed    Modified Rankin (Stroke Patients Only)       Balance Overall balance assessment: Needs assistance Sitting-balance support: Feet supported Sitting balance-Leahy Scale: Good Sitting balance - Comments: Able to maintain seated EOB balance during donning of TLSO brace   Standing balance support: Bilateral upper extremity supported, During functional activity, Reliant on assistive device for balance Standing balance-Leahy Scale: Good                              Cognition Arousal: Alert Behavior During Therapy: WFL for tasks assessed/performed Overall Cognitive Status: Within Functional Limits for tasks assessed                                 General Comments: pt is A and O x 4.  cooperative throughout        Exercises      General Comments General comments (skin integrity, edema, etc.): Pt educated on benefits of frequent mobility to regain strength and functional independence.      Pertinent Vitals/Pain Pain Assessment Pain Assessment: 0-10 Pain Score: 8  Pain Location: back Pain Descriptors / Indicators: Discomfort, Sore Pain Intervention(s):  Premedicated before session    Home Living                          Prior Function            PT Goals (current goals can now be found in the care plan section) Acute Rehab PT Goals Patient Stated Goal: none stated Progress towards PT goals: Progressing toward goals    Frequency    Min 1X/week      PT Plan      Co-evaluation              AM-PAC PT "6 Clicks" Mobility   Outcome Measure  Help needed turning from your back to your side while in a flat bed without using bedrails?: None Help needed moving from lying on your back to sitting on the side of a flat bed without using bedrails?: None Help needed moving to and from a bed to a chair (including a wheelchair)?: A Little Help needed standing up from a chair using your arms (e.g., wheelchair or bedside chair)?: A Little Help needed to walk in hospital room?: A Little Help needed climbing 3-5 steps with a railing? : A Little 6 Click Score: 20    End of Session Equipment Utilized During Treatment: Back brace;Other (comment) (TLSO) Activity Tolerance: Patient tolerated treatment well Patient left: in bed;with call bell/phone within reach;with bed alarm set Nurse Communication: Mobility status PT Visit Diagnosis: Pain;Other abnormalities of gait and mobility (R26.89);Muscle weakness (generalized) (M62.81) Pain - Right/Left: Right Pain - part of body: Leg     Time: 2956-2130 PT Time Calculation (min) (ACUTE ONLY): 34 min  Charges:    $Gait Training: 8-22 mins $Therapeutic Exercise: 8-22 mins PT General Charges $$ ACUTE PT VISIT: 1 Visit                    Zadie Cleverly, PTA  Jannet Askew 04/21/2023, 5:30 PM

## 2023-04-21 NOTE — Plan of Care (Signed)
  Problem: Education: Goal: Knowledge of General Education information will improve Description: Including pain rating scale, medication(s)/side effects and non-pharmacologic comfort measures Outcome: Progressing   Problem: Health Behavior/Discharge Planning: Goal: Ability to manage health-related needs will improve Outcome: Progressing   Problem: Clinical Measurements: Goal: Ability to maintain clinical measurements within normal limits will improve Outcome: Progressing   Problem: Activity: Goal: Risk for activity intolerance will decrease Outcome: Progressing   Problem: Nutrition: Goal: Adequate nutrition will be maintained Outcome: Progressing   Problem: Elimination: Goal: Will not experience complications related to bowel motility Outcome: Progressing Goal: Will not experience complications related to urinary retention Outcome: Progressing   Problem: Skin Integrity: Goal: Risk for impaired skin integrity will decrease Outcome: Progressing   Problem: Education: Goal: Knowledge of General Education information will improve Description: Including pain rating scale, medication(s)/side effects and non-pharmacologic comfort measures Outcome: Progressing   Problem: Health Behavior/Discharge Planning: Goal: Ability to manage health-related needs will improve Outcome: Progressing

## 2023-04-21 NOTE — Plan of Care (Signed)

## 2023-04-21 NOTE — Progress Notes (Signed)
Occupational Therapy Treatment Patient Details Name: Adam Cabrera MRN: [id number] DOB: November 13, 1985 Today's Date: 04/21/2023   History of present illness 37 y.o. male with medical history significant for hypertension, scrotal cellulitis, history of IV drug use, presents with low back pain.  MRI of the lumbar spine showed Epidural extension with epidural phlegmon/abscess within the ventral epidural space extending from L1-2 through L3-4 and s/p lumbar laminectomy; Pt is now s/p L1-4 PSF with right L3 facetectomy 04/10/23.   OT comments  Pt seen for OT treatment on this date. Upon arrival to room pt was supine, agreeable to tx. Pt requires supervision for all bed mobility. MOD A donning TLSO with cues. Good recall of edu for AE LB dressing with use of sock aid and reacher. MOD I using sock aid to don B socks. MIN A with reacher don underwear. CGA + RW while standing grooming -  1 posterior LOB, corrected with tactile cue and stager step. Pt making good progress toward goals, will continue to follow POC. Discharge recommendation remains appropriate.        If plan is discharge home, recommend the following:  A little help with walking and/or transfers;A lot of help with bathing/dressing/bathroom;Assist for transportation;Assistance with cooking/housework;Help with stairs or ramp for entrance   Equipment Recommendations       Recommendations for Other Services      Precautions / Restrictions Precautions Precautions: Back;Fall Required Braces or Orthoses: Spinal Brace Spinal Brace: Thoracolumbosacral orthotic Spinal Brace Comments: x Restrictions Weight Bearing Restrictions: No       Mobility Bed Mobility Overal bed mobility: Modified Independent Bed Mobility: Supine to Sit, Sit to Supine     Supine to sit: Supervision, HOB elevated Sit to supine: Supervision, HOB elevated   General bed mobility comments: Indep brought legs onto bed Patient Response: Cooperative  Transfers Overall  transfer level: Needs assistance Equipment used: Rolling walker (2 wheels) Transfers: Sit to/from Stand Sit to Stand: From elevated surface, Supervision                 Balance Overall balance assessment: Needs assistance Sitting-balance support: Feet supported Sitting balance-Leahy Scale: Good Sitting balance - Comments: Able to maintain seated EOB balance during donning of TLSO brace with occasional no UE support indicating improvement   Standing balance support: Bilateral upper extremity supported, During functional activity, Reliant on assistive device for balance Standing balance-Leahy Scale: Good Standing balance comment: Able to maintain static standing balance during functional activities                           ADL either performed or assessed with clinical judgement   ADL Overall ADL's : Needs assistance/impaired     Grooming: Oral care;Contact guard assist;Standing           Upper Body Dressing : Sitting;Minimal assistance (MOD A with buttons around IV and on shoulder to adhere to back percautions) Upper Body Dressing Details (indicate cue type and reason): MOD A TLSO Lower Body Dressing: Adhering to back precautions;Cueing for back precautions;Moderate assistance;With adaptive equipment (AE: reacher with his personal underwear)               Functional mobility during ADLs: Rolling walker (2 wheels);Contact guard assist General ADL Comments: When taking meds at sink (R)W with RN present, pt had 1 posterior step back while tossing back meds in his mouth. Pt needed 1 tactle cue to correct balance with stager step    Extremity/Trunk  Assessment              Vision       Perception     Praxis      Cognition Arousal: Alert Behavior During Therapy: WFL for tasks assessed/performed Overall Cognitive Status: Within Functional Limits for tasks assessed                                 General Comments: pt is A and O x 4.  cooperative throughout        Exercises Other Exercises Other Exercises: Edu re: ADL training for LB dressing with a sock aid and a reacher donning personal underwear    Shoulder Instructions       General Comments incision intact pre/post session    Pertinent Vitals/ Pain       Pain Assessment Pain Assessment: 0-10 Pain Score: 9  Pain Location: back Pain Descriptors / Indicators: Discomfort, Sore Pain Intervention(s): Monitored during session, Relaxation, RN gave pain meds during session, Limited activity within patient's tolerance  Home Living                                          Prior Functioning/Environment              Frequency  Min 1X/week        Progress Toward Goals  OT Goals(current goals can now be found in the care plan section)  Progress towards OT goals: Progressing toward goals  Acute Rehab OT Goals Patient Stated Goal: Improved pain OT Goal Formulation: With patient Time For Goal Achievement: 05/02/23 Potential to Achieve Goals: Good ADL Goals Pt Will Perform Grooming: with contact guard assist;standing Pt Will Perform Lower Body Dressing: with min assist;sit to/from stand Pt Will Transfer to Toilet: with contact guard assist;ambulating Pt Will Perform Toileting - Clothing Manipulation and hygiene: with min assist;sit to/from stand  Plan      Co-evaluation          OT goals addressed during session: ADL's and self-care      AM-PAC OT "6 Clicks" Daily Activity     Outcome Measure   Help from another person eating meals?: None Help from another person taking care of personal grooming?: None Help from another person toileting, which includes using toliet, bedpan, or urinal?: A Lot Help from another person bathing (including washing, rinsing, drying)?: A Lot Help from another person to put on and taking off regular upper body clothing?: A Little Help from another person to put on and taking off regular lower  body clothing?: A Little 6 Click Score: 18    End of Session Equipment Utilized During Treatment: Rolling walker (2 wheels)  OT Visit Diagnosis: Unsteadiness on feet (R26.81);Muscle weakness (generalized) (M62.81)   Activity Tolerance Patient tolerated treatment well   Patient Left with call bell/phone within reach;in bed   Nurse Communication          Time: 6578-4696 OT Time Calculation (min): 27 min  Charges: OT General Charges $OT Visit: 1 Visit OT Treatments $Self Care/Home Management : 23-37 mins  [employer], OTS

## 2023-04-22 DIAGNOSIS — M4646 Discitis, unspecified, lumbar region: Secondary | ICD-10-CM | POA: Diagnosis not present

## 2023-04-22 DIAGNOSIS — B9561 Methicillin susceptible Staphylococcus aureus infection as the cause of diseases classified elsewhere: Secondary | ICD-10-CM | POA: Diagnosis not present

## 2023-04-22 DIAGNOSIS — B1920 Unspecified viral hepatitis C without hepatic coma: Secondary | ICD-10-CM | POA: Insufficient documentation

## 2023-04-22 DIAGNOSIS — M8600 Acute hematogenous osteomyelitis, unspecified site: Secondary | ICD-10-CM | POA: Diagnosis not present

## 2023-04-22 DIAGNOSIS — R7881 Bacteremia: Secondary | ICD-10-CM | POA: Diagnosis not present

## 2023-04-22 DIAGNOSIS — A419 Sepsis, unspecified organism: Secondary | ICD-10-CM | POA: Diagnosis not present

## 2023-04-22 DIAGNOSIS — G061 Intraspinal abscess and granuloma: Secondary | ICD-10-CM | POA: Diagnosis not present

## 2023-04-22 DIAGNOSIS — K6812 Psoas muscle abscess: Secondary | ICD-10-CM | POA: Diagnosis not present

## 2023-04-22 LAB — CBC
HCT: 27.7 % — ABNORMAL LOW (ref 39.0–52.0)
Hemoglobin: 8.5 g/dL — ABNORMAL LOW (ref 13.0–17.0)
MCH: 28.2 pg (ref 26.0–34.0)
MCHC: 30.7 g/dL (ref 30.0–36.0)
MCV: 92 fL (ref 80.0–100.0)
Platelets: 129 10*3/uL — ABNORMAL LOW (ref 150–400)
RBC: 3.01 MIL/uL — ABNORMAL LOW (ref 4.22–5.81)
RDW: 21.3 % — ABNORMAL HIGH (ref 11.5–15.5)
WBC: 3 10*3/uL — ABNORMAL LOW (ref 4.0–10.5)
nRBC: 0 % (ref 0.0–0.2)

## 2023-04-22 LAB — HCV RNA QUANT
HCV Quantitative Log: 5.848 {Log_IU}/mL (ref >1.70)
HCV Quantitative: 705000 [IU]/mL (ref >50)

## 2023-04-22 LAB — HEPATITIS A ANTIBODY, TOTAL: hep A Total Ab: NONREACTIVE

## 2023-04-22 LAB — HEPATITIS B SURFACE ANTIGEN: Hepatitis B Surface Ag: NONREACTIVE

## 2023-04-22 MED ORDER — HYDROCORTISONE ACETATE 25 MG RE SUPP
25.0000 mg | Freq: Two times a day (BID) | RECTAL | Status: DC | PRN
  Administered 2023-05-01: 25 mg via RECTAL
  Filled 2023-04-22 (×2): qty 1

## 2023-04-22 MED ORDER — CEFAZOLIN SODIUM-DEXTROSE 2-4 GM/100ML-% IV SOLN
2.0000 g | Freq: Three times a day (TID) | INTRAVENOUS | Status: DC
  Administered 2023-04-22 – 2023-05-13 (×62): 2 g via INTRAVENOUS
  Filled 2023-04-22 (×63): qty 100

## 2023-04-22 NOTE — Plan of Care (Signed)
Problem: Clinical Measurements: Goal: Will remain free from infection Outcome: Progressing   Problem: Pain Managment: Goal: General experience of comfort will improve Outcome: Progressing

## 2023-04-22 NOTE — TOC Progression Note (Signed)
Transition of Care [facility]) - Progression Note    Patient Details  Name: Adam Cabrera MRN: [id number] Date of Birth: May 25, 1986  [facility] CM/SW Contact  Marlowe Sax, RN Phone Number: 04/22/2023, 11:55 AM  Clinical Narrative:     Met with the patient in the room at the bedside, he tells me that he graduated from a rehab program for Drug use that was a year long program, he is living with his parents at this time He may need a rolling walker at DC but will wait til closer to the DC date to determine if one is needed, his family provides transportation at this time, I included Transportation resources also as he will need to get to Follow up appointments, he stated thanks TOC will continue to follow for additional needs  Expected Discharge Plan: Home/Self Care Barriers to Discharge: Continued Medical Work up  Expected Discharge Plan and Services   Discharge Planning Services: CM Consult   Living arrangements for the past 2 months: Single Family Home                   DME Agency: NA       HH Arranged: NA           Social Determinants of Health (SDOH) Interventions SDOH Screenings   Food Insecurity: Patient Declined (04/17/2023)  Housing: Patient Declined (04/17/2023)  Transportation Needs: Patient Declined (04/17/2023)  Utilities: Patient Declined (04/17/2023)  Tobacco Use: High Risk (04/10/2023)    Readmission Risk Interventions     No data to display

## 2023-04-22 NOTE — Progress Notes (Signed)
Date of Admission:  03/23/2023      ID: RAIN CURL is a 37 y.o. 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)   Sepsis without acute organ dysfunction (HCC)    Subjective: Doing better since he had fusion surgery on 04/10/23 Sitting in chair Pain better  Medications:   acetaminophen  1,000 mg Oral Q6H   ammonium lactate   Topical BID   enoxaparin (LOVENOX) injection  40 mg Subcutaneous Q24H   ferrous sulfate  325 mg Oral Q breakfast   gabapentin  300 mg Oral TID   hydrocortisone  25 mg Rectal TID   methocarbamol  750 mg Oral Q6H   morphine  15 mg Oral Q12H   nicotine  21 mg Transdermal Daily   rifampin  300 mg Oral Q12H   senna-docusate  2 tablet Oral BID   sodium chloride flush  3 mL Intravenous Q12H   sodium chloride flush  3 mL Intravenous Q12H   terbinafine   Topical BID   traZODone  50 mg Oral QHS    Objective: Vital signs in last 24 hours: Patient Vitals for the past 24 hrs:  BP Temp Temp src Pulse Resp SpO2 Weight  04/22/23 0833 112/69 98 F (36.7 C) -- 78 14 100 % --  04/22/23 0500 -- -- -- -- -- -- 116.3 kg  04/21/23 2312 114/67 98 F (36.7 C) Oral 77 20 100 % --  04/21/23 1551 (!) 142/81 98.3 F (36.8 C) -- 88 20 100 % --      PHYSICAL EXAM:  General: Alert, no distress  Lungs: Clear to auscultation bilaterally. No Wheezing or Rhonchi. No rales. Heart: Regular rate and rhythm, no murmur, rub or gallop. Spine - surgial site covered wit honey comb  dressing Sitting in chair  Lab Results    Latest Ref Rng & Units 04/22/2023    4:16 AM 04/21/2023    2:51 AM 04/19/2023    5:00 AM  CBC  WBC 4.0 - 10.5 K/uL 3.0  3.7    Hemoglobin 13.0 - 17.0 g/dL 8.5  8.6  8.1   Hematocrit 39.0 - 52.0 % 27.7  28.7    Platelets 150 - 400 K/uL 129  111         Latest Ref Rng & Units 04/21/2023    2:51 AM 04/19/2023    4:57 AM 04/18/2023    4:37 AM  CMP  Glucose 70 - 99 mg/dL 99   130   BUN 6 - 20 mg/dL 8   8   Creatinine 8.65 - 1.24 mg/dL 7.84   6.96   Sodium 295 - 145 mmol/L 135   137   Potassium 3.5 - 5.1 mmol/L 4.0  3.6  2.9   Chloride 98 - 111 mmol/L 104   104   CO2 22 - 32 mmol/L 26   25   Calcium 8.9 - 10.3 mg/dL 8.4   7.8   Total  Protein 6.5 - 8.1 g/dL 6.4   5.8   Total Bilirubin 0.3 - 1.2 mg/dL 1.5   1.2   Alkaline Phos 38 - 126 U/L 52   49   AST 15 - 41 U/L 17   14   ALT 0 - 44 U/L 11   13       Microbiology: 8/18 [facility]- MSSA 8/19 Surgical culture staph aureus 8/21 BC NG 9/5 Surgical culture MSSA   Assessment/Plan: MSSA bacteremia Discitis and osteomyleitis L2-L3 With epidural abscess/phlegmon at the same level- MSSA in culture S/p laminectomy and debridement on 03/24/23 Repeat blood culture Neg On nafcillin continuous infusion   2 d echo no vegetation Underwent fusion on on 04/10/23   Transforaminal Lumbar Interbody Fusion L2-3 2. Posterolateral arthrodesis L1 to L4 3. Posterior segmental instrumentation L1 to L4  4.  Lumbar decompression with total facetectomy on the right at L2-3 5.  Allograft placement in the 2 3 disc space for arthrodesis  Culture positive for MSSA On nafcillin and rifampin Will need 6 more weeks Change nafcillin to cefazolin Watch LFTS closely while on rifampin because eof hepc and possible liver cirrhosis Low threshold to stop rifampin  TEE not needed as it will not change management   B/l psoas abscesses ,     Chronic Hepaittis C- has not ben treated- will need management as OP VL [id number]    Splenomegaly likely due to portal hypertension likely cirrhosis   IVDA- cannot be sent home with PICC line and IV antibiotic   Discussed the management with the patient and care team

## 2023-04-22 NOTE — Progress Notes (Signed)
Physical Therapy Treatment Patient Details Name: Adam Cabrera MRN: [id number] DOB: 01/09/1986 Today's Date: 04/22/2023   History of Present Illness 37 y.o. male with medical history significant for hypertension, scrotal cellulitis, history of IV drug use, presents with low back pain.  MRI of the lumbar spine showed Epidural extension with epidural phlegmon/abscess within the ventral epidural space extending from L1-2 through L3-4 and s/p lumbar laminectomy; Pt is now s/p L1-4 PSF with right L3 facetectomy 04/10/23.    PT Comments  Pt received 15mg  of Oxy 10 minutes prior to PT, 9/10 pain upon completion of session, however pt motivated to work through pain level. Increased time spent in bathroom requiring Min/ModA to raise from low toilet seat. Large amount of BRBPR after BM, nursing notified. Pt completed gait training in hallway with support of Rw and donned TLSO. Occasional "miss steps",, yet pt able to regain balance independently. Ill progress to stair training next visit due to unknown d/c date. Continue PT acutely.   If plan is discharge home, recommend the following: Assistance with cooking/housework;Assist for transportation;Help with stairs or ramp for entrance;A little help with walking and/or transfers;A little help with bathing/dressing/bathroom   Can travel by private vehicle        Equipment Recommendations  Other (comment) (TBD closer to d/c, possible Rw)    Recommendations for Other Services       Precautions / Restrictions Precautions Precautions: Back;Fall Precaution Booklet Issued: No Precaution Comments: Log Roll Required Braces or Orthoses: Spinal Brace Spinal Brace: Thoracolumbosacral orthotic Spinal Brace Comments: MinA to don TLSO Restrictions Weight Bearing Restrictions: No     Mobility  Bed Mobility Overal bed mobility: Modified Independent Bed Mobility: Supine to Sit, Sit to Supine Rolling: Supervision, Used rails, Modified independent (Device/Increase  time) Sidelying to sit: Supervision Supine to sit: Supervision, HOB elevated Sit to supine: Supervision, HOB elevated Sit to sidelying: HOB elevated, Used rails, Supervision General bed mobility comments: Indep brought legs onto bed    Transfers Overall transfer level: Needs assistance Equipment used: Rolling walker (2 wheels) Transfers: Sit to/from Stand Sit to Stand: From elevated surface, Supervision           General transfer comment: Sit to stand from low toilet with Min/ModA    Ambulation/Gait Ambulation/Gait assistance: Supervision Gait Distance (Feet): 400 Feet Assistive device: Rolling walker (2 wheels) Gait Pattern/deviations: Step-through pattern Gait velocity: WNL     General Gait Details: pt continues to tolerate ambulation and OOB activity better each day. recommended pt get up and ambulate more freqently.   Stairs             Wheelchair Mobility     Tilt Bed    Modified Rankin (Stroke Patients Only)       Balance Overall balance assessment: Needs assistance Sitting-balance support: Feet supported Sitting balance-Leahy Scale: Good     Standing balance support: Bilateral upper extremity supported, During functional activity, Reliant on assistive device for balance Standing balance-Leahy Scale: Good Standing balance comment: Able to maintain static standing balance during functional activities                            Cognition Arousal: Alert Behavior During Therapy: WFL for tasks assessed/performed Overall Cognitive Status: Within Functional Limits for tasks assessed  General Comments: pt is A and O x 4. cooperative throughout        Exercises      General Comments General comments (skin integrity, edema, etc.):  (Reviewed standing exercises, pt encouraged to continue seated LE exer. independently)      Pertinent Vitals/Pain Pain Assessment Pain Assessment: 0-10 Pain  Score: 9  Pain Location: back Pain Descriptors / Indicators: Discomfort, Sore Pain Intervention(s): Premedicated before session, Patient requesting pain meds-RN notified    Home Living                          Prior Function            PT Goals (current goals can now be found in the care plan section) Acute Rehab PT Goals Patient Stated Goal:  (Go home) Progress towards PT goals: Progressing toward goals    Frequency    Min 1X/week      PT Plan      Co-evaluation              AM-PAC PT "6 Clicks" Mobility   Outcome Measure  Help needed turning from your back to your side while in a flat bed without using bedrails?: None Help needed moving from lying on your back to sitting on the side of a flat bed without using bedrails?: None Help needed moving to and from a bed to a chair (including a wheelchair)?: A Little Help needed standing up from a chair using your arms (e.g., wheelchair or bedside chair)?: A Little Help needed to walk in hospital room?: A Little Help needed climbing 3-5 steps with a railing? : A Little 6 Click Score: 20    End of Session Equipment Utilized During Treatment: Back brace;Other (comment) Activity Tolerance: Patient tolerated treatment well Patient left: in chair;with call bell/phone within reach;with chair alarm set Nurse Communication: Mobility status PT Visit Diagnosis: Pain;Other abnormalities of gait and mobility (R26.89);Muscle weakness (generalized) (M62.81) Pain - part of body:  (back)     Time: 1130-1206 PT Time Calculation (min) (ACUTE ONLY): 36 min  Charges:    $Gait Training: 8-22 mins $Therapeutic Activity: 8-22 mins PT General Charges $$ ACUTE PT VISIT: 1 Visit                    Zadie Cleverly, PTA  Jannet Askew 04/22/2023, 3:55 PM

## 2023-04-22 NOTE — Progress Notes (Signed)
Progress Note   Patient: Adam Cabrera:[id number] DOB: [birth year] DOA: 03/23/2023     30 DOS: the patient was seen and examined on 04/22/2023   Brief hospital course: 37 y.o. male with medical history significant for hypertension, scrotal cellulitis, history of IV drug use, presents with low back pain.  MRI of the lumbar spine showed Epidural extension with epidural phlegmon/abscess within the ventral epidural space extending from L1-2 through L3-4.  Blood culture positive for Staph aureus, patient treated with cefepime and vancomycin.  Patient has been seen by ID and neurosurgery, L spine washout performed 8/19. Patient condition had improved up to 8/26, wound VAC removed.  Patient will need 6 weeks of IV antibiotics.  Per TOC, there is no placement option.  Patient currently on on continuous nafcillin infusion.  Patient had a worsening back pain since 9/3, Patient was brought to the OR again 9/5. Had: 1. Transforaminal Lumbar Interbody Fusion L2-3.  2. Posterolateral arthrodesis L1 to L4 3. Posterior segmental instrumentation L1 to L4; 4.  Lumbar decompression with total facetectomy on the right at L2-3;  5.  Allograft placement in the 2 3 disc space for arthrodesis.  One of the wound cultures from 9/5 growing rare Staph aureus.    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  On 9/5, patient had a another neurosurgical procedure which included transforaminal lumbar interbody fusion of L2/L3, posterolateral arthrodesis L1-L4, posterior segment instrumentation L1-L4, lumbar decompression with total fasciotomy on the right at L2-L3, allograft placement in the 2/3 disc space for arthrodesis.  Patient on continuous nafcillin.  Continue rifampin.  ID recommends 6 weeks total of IV antibiotics.  Since one of the cultures from 9/5 growing Staph aureus may have to be 6 weeks of antibiotics from that  culture.    MSSA (methicillin susceptible Staphylococcus aureus) septicemia (HCC) Present on admission with tachycardia, tachypnea and epidural abscess.  Continue continuous IV nafcillin and rifampin.  Osteomyelitis (HCC) And discitis and osteomyelitis of L2-L3.  Continue IV nafcillin and rifampin.  Psoas abscess, left (HCC) Continue IV nafcillin and rifampin.  Thrombocytopenia (HCC) Last platelet count 129.  Wondering if this is secondary to nafcillin.  Recheck tomorrow.  Hepatitis C viral load positive, hepatitis A negative, hepatitis B surface antigen negative  Fresh blood passed per rectum Anusol suppository as needed.  Obesity (BMI 30-39.9) BMI 32.92   Hypokalemia Replaced  Iron deficiency anemia Last hemoglobin 8.5.  Combination of iron deficiency anemia and postoperative anemia.  Patient on oral iron.  Likely hemorrhoids also contributing to anemia.  Anusol suppository as needed.  Dry skin Continue Lac-Hydrin and antifungal to bilateral feet.  Headache As needed Fioricet.  Hyponatremia Resolved  Back pain Continue MS Contin 15 mg twice a day.  Continue as needed oxycodone.  As needed IV Dilaudid.  With starting the MS Contin hopefully patient will have less use of the IV pain meds.        Subjective: Patient still has some back soreness.  Sometimes he tries to do too much.  Has some tingling in his feet but not his legs.  Admitted with Staph aureus sepsis, epidural abscess and psoas muscle abscess.  Physical Exam: Vitals:   04/21/23 1551 04/21/23 2312 04/22/23 0500 04/22/23 0833  BP: (!) 142/81 114/67  112/69  Pulse: 88 77  78  Resp: 20 20  14   Temp: 98.3 F (36.8 C) 98 F (36.7 C)  98 F (  36.7 C)  TempSrc:  Oral    SpO2: 100% 100%  100%  Weight:   116.3 kg   Height:       Physical Exam HENT:     Head: Normocephalic.     Mouth/Throat:     Pharynx: No oropharyngeal exudate.  Eyes:     General: Lids are normal.     Conjunctiva/sclera:  Conjunctivae normal.  Cardiovascular:     Rate and Rhythm: Normal rate and regular rhythm.     Heart sounds: Normal heart sounds, S1 normal and S2 normal.  Pulmonary:     Breath sounds: No decreased breath sounds, wheezing, rhonchi or rales.  Abdominal:     Palpations: Abdomen is soft.     Tenderness: There is no abdominal tenderness.  Musculoskeletal:     Right lower leg: Swelling present.     Left lower leg: Swelling present.  Skin:    General: Skin is warm.     Comments: Dried scaling bilateral feet.    Neurological:     Mental Status: He is alert and oriented to person, place, and time.     Comments: Patient able to straight leg raise bilaterally.     Data Reviewed: White blood cell count 3.0, hemoglobin 8.5, platelet count 129, hepatitis A antibody negative, hepatitis B surface antigen negative.  Hepatitis C viral load positive   Disposition: Status is: Inpatient Remains inpatient appropriate because: Patient will need 6 weeks of IV antibiotics while here in the hospital.  Since the patient did have a positive culture from 9/5 will have ID decide on when the start date of the antibiotics would be.  Planned Discharge Destination: Home with Home Health    Time spent: 28 minutes  Author: Alford Highland, MD 04/22/2023 2:40 PM  For on call review [email or web address].

## 2023-04-22 NOTE — Plan of Care (Signed)
  Problem: Health Behavior/Discharge Planning: Goal: Ability to manage health-related needs will improve Outcome: Progressing   Problem: Clinical Measurements: Goal: Ability to maintain clinical measurements within normal limits will improve Outcome: Progressing Goal: Diagnostic test results will improve Outcome: Progressing   Problem: Activity: Goal: Risk for activity intolerance will decrease Outcome: Progressing   Problem: Nutrition: Goal: Adequate nutrition will be maintained Outcome: Progressing   Problem: Coping: Goal: Level of anxiety will decrease Outcome: Progressing   Problem: Elimination: Goal: Will not experience complications related to bowel motility Outcome: Progressing Goal: Will not experience complications related to urinary retention Outcome: Progressing   Problem: Pain Managment: Goal: General experience of comfort will improve Outcome: Progressing

## 2023-04-23 DIAGNOSIS — G061 Intraspinal abscess and granuloma: Secondary | ICD-10-CM | POA: Diagnosis not present

## 2023-04-23 NOTE — Progress Notes (Signed)
[facility]  - [place] [facility]   PATIENT NAME: Adam Cabrera    MR#:  [id number]  DATE OF BIRTH:  [birth year]  SUBJECTIVE:  laying in bed comfortably  no new complaints  VITALS:  Blood pressure 126/73, pulse 77, temperature 98.1 F (36.7 C), resp. rate 18, height 6\' 2"  (1.88 m), weight 116.4 kg, SpO2 100%.  PHYSICAL EXAMINATION:   GENERAL:  37 y.o.-year-old patient with no acute distress.  LUNGS: Normal breath sounds bilaterally, no wheezing CARDIOVASCULAR: S1, S2 normal. No murmur   ABDOMEN: Soft, nontender, nondistended. Bowel sounds present.  EXTREMITIES: No  edema b/l.    NEUROLOGIC: nonfocal  patient is alert and awake SKIN: tatto +  LABORATORY PANEL:  CBC Recent Labs  Lab 04/22/23 0416  WBC 3.0*  HGB 8.5*  HCT 27.7*  PLT 129*    Chemistries  Recent Labs  Lab 04/19/23 0500 04/21/23 0251  NA  --  135  K  --  4.0  CL  --  104  CO2  --  26  GLUCOSE  --  99  BUN  --  8  CREATININE  --  0.58*  CALCIUM  --  8.4*  MG 1.9  --   AST  --  17  ALT  --  11  ALKPHOS  --  52  BILITOT  --  1.5*    Assessment and Plan  37 y.o. male with medical history significant for hypertension, scrotal cellulitis, history of IV drug use, presents with low back pain.  MRI of the lumbar spine showed Epidural extension with epidural phlegmon/abscess within the ventral epidural space extending from L1-2 through L3-4.  Blood culture positive for Staph aureus, patient treated with cefepime and vancomycin.  Patient has been seen by ID and neurosurgery, L spine washout performed 8/19.   Patient had a worsening back pain since 9/3, Patient was brought to the OR again 9/5. Had: 1. Transforaminal Lumbar Interbody Fusion L2-3.  2. Posterolateral arthrodesis L1 to L4 3. Posterior segmental instrumentation L1 to L4; 4.  Lumbar decompression with total facetectomy on the right at L2-3;  5.  Allograft placement in the 2 3 disc space for arthrodesis.   One of the wound  cultures from 9/5 growing rare Staph aureus.   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  On 9/5, patient had a another neurosurgical procedure which included transforaminal lumbar interbody fusion of L2/L3, posterolateral arthrodesis L1-L4, posterior segment instrumentation L1-L4, lumbar decompression with total fasciotomy on the right at L2-L3, allograft placement in the 2/3 disc space for arthrodesis.  Patient on continuous nafcillin.  Continue rifampin.  ID recommends 6 weeks total of IV antibiotics.  Since one of the cultures from 9/5 growing Staph aureus may have to be 6 weeks of antibiotics from that culture.       MSSA (methicillin susceptible Staphylococcus aureus) septicemia (HCC) --Present on admission with tachycardia, tachypnea and epidural abscess. --  Continue continuous IV nafcillin and rifampin.   Osteomyelitis (HCC) --And discitis and osteomyelitis of L2-L3.   --Continue IV cefazolin and rifampin--6 weeks from 04/10/2023   Psoas abscess, left (HCC) --Continue IV nafcillin and rifampin.   Thrombocytopenia (HCC) --Last platelet count 129.  Wondering if this is secondary to nafcillin.  -- Hepatitis C viral load positive, hepatitis A negative, hepatitis B surface antigen negative   BRBPR --Anusol suppository as needed--improved   Obesity (BMI 30-39.9) BMI 32.92   Hypokalemia Replaced  Iron deficiency anemia --Last hemoglobin 8.5.   --Combination of iron deficiency anemia and postoperative anemia.   --Patient on oral iron.  -- Likely hemorrhoids also contributing to anemia.  Anusol suppository as needed.    Headache As needed Fioricet.   Hyponatremia Resolved   Back pain Continue MS Contin 15 mg twice a day.  Continue as needed oxycodone.  As needed IV Dilaudid.  With starting the MS Contin hopefully patient will have less use of the IV pain meds. --will need to readjust pain meds --he is  2 weeks days post op  CODE STATUS: full DVT Prophylaxis :lovenox Level of care: Med-Surg Status is: Inpatient Remains inpatient appropriate because: Needs to complete IV abxs in the hospital    TOTAL TIME TAKING CARE OF THIS PATIENT: 25 minutes.  >50% time spent on counselling and coordination of care  Note: This dictation was prepared with Dragon dictation along with smaller phrase technology. Any transcriptional errors that result from this process are unintentional.  Enedina Finner M.D    [facility]   CC: Primary care physician; Patient, No Pcp Per

## 2023-04-23 NOTE — Progress Notes (Signed)
Occupational Therapy Reevaluation Patient Details Name: Adam Cabrera MRN: [id number] DOB: 04/09/1986 Today's Date: 04/23/2023   History of present illness 37 y.o. male with medical history significant for hypertension, scrotal cellulitis, history of IV drug use, presents with low back pain.  MRI of the lumbar spine showed Epidural extension with epidural phlegmon/abscess within the ventral epidural space extending from L1-2 through L3-4 and s/p lumbar laminectomy; Pt is now s/p L1-4 PSF with right L3 facetectomy 04/10/23.   OT comments  Pt seen for OT re-eval on this date. Upon arrival to room pt supine in bed, agreeable to tx. Bed mobility pt is indep. UB dressing doffing TLSO indep on EOB, donning MIN A assist to reach shoulder straps. MOD I to don socks with sock aid, and doff with reacher. Completed shower transfer with CGA + RW. Reviewed spinal precautions. Goals met and updated to reflect pt progress, will continue to follow POC. Plan to address toileting. Discharge recommendation remains appropriate.        If plan is discharge home, recommend the following:  A little help with walking and/or transfers;Assist for transportation;Assistance with cooking/housework;Help with stairs or ramp for entrance;A little help with bathing/dressing/bathroom   Equipment Recommendations  BSC/3in1;Tub/shower seat    Recommendations for Other Services      Precautions / Restrictions Precautions Precautions: Back;Fall Precaution Booklet Issued: No Precaution Comments: Log Roll Required Braces or Orthoses: Spinal Brace Spinal Brace: Thoracolumbosacral orthotic Spinal Brace Comments: x Restrictions Weight Bearing Restrictions: No       Mobility Bed Mobility Overal bed mobility: Modified Independent               Patient Response: Cooperative  Transfers Overall transfer level: Modified independent Equipment used: Rolling walker (2 wheels) Transfers: Sit to/from Stand Sit to Stand: From  elevated surface, Supervision                 Balance Overall balance assessment: Needs assistance Sitting-balance support: Feet supported Sitting balance-Leahy Scale: Good Sitting balance - Comments: Able to maintain seated EOB balance during donning of TLSO brace   Standing balance support: Bilateral upper extremity supported, During functional activity, Reliant on assistive device for balance Standing balance-Leahy Scale: Good Standing balance comment: Able to maintain static standing balance during functional activities                           ADL either performed or assessed with clinical judgement   ADL Overall ADL's : Needs assistance/impaired Eating/Feeding: Set up;Sitting               Upper Body Dressing : Sitting;Minimal assistance Upper Body Dressing Details (indicate cue type and reason): Donning: Needs MIN A with TLSO straps, otherwise indep. Doffing: Indep TLSO Lower Body Dressing: Adhering to back precautions;With adaptive equipment;Modified independent;Bed level   Toilet Transfer: Minimal assistance;Regular Teacher, adult education Details (indicate cue type and reason): Anticipate MIN assist for take off Toileting- Clothing Manipulation and Hygiene: Minimal assistance;Adhering to back precautions Toileting - Clothing Manipulation Details (indicate cue type and reason): anticipated     Functional mobility during ADLs: Rolling walker (2 wheels);Contact guard assist      Extremity/Trunk Assessment Upper Extremity Assessment Upper Extremity Assessment: Overall WFL for tasks assessed       Cervical / Trunk Assessment Cervical / Trunk Assessment: Back Surgery    Vision       Perception     Praxis      Cognition  Arousal: Alert Behavior During Therapy: WFL for tasks assessed/performed Overall Cognitive Status: Within Functional Limits for tasks assessed                                 General Comments: pt is A and O  x 4. cooperative throughout        Exercises Other Exercises Other Exercises: Edu re: ADL training for LB dressing with a sock aid and a reacher doffing socks, indpendent in AE use    Shoulder Instructions       General Comments      Pertinent Vitals/ Pain       Pain Assessment Pain Score: 8  Pain Location: back Pain Descriptors / Indicators: Discomfort, Sore Pain Intervention(s): Monitored during session  Home Living                                          Prior Functioning/Environment              Frequency  Min 1X/week        Progress Toward Goals  OT Goals(current goals can now be found in the care plan section)  Progress towards OT goals: Goals updated;Progressing toward goals  Acute Rehab OT Goals Patient Stated Goal: less pain OT Goal Formulation: With patient Time For Goal Achievement: 05/07/23 Potential to Achieve Goals: Good ADL Goals Pt Will Transfer to Toilet: Independently;ambulating;regular height toilet Pt Will Perform Toileting - Clothing Manipulation and hygiene: Independently;sit to/from stand;sitting/lateral leans  Plan      Co-evaluation          OT goals addressed during session: ADL's and self-care      AM-PAC OT "6 Clicks" Daily Activity     Outcome Measure   Help from another person eating meals?: None Help from another person taking care of personal grooming?: None Help from another person toileting, which includes using toliet, bedpan, or urinal?: A Little Help from another person bathing (including washing, rinsing, drying)?: A Little Help from another person to put on and taking off regular upper body clothing?: A Little Help from another person to put on and taking off regular lower body clothing?: A Little 6 Click Score: 20    End of Session Equipment Utilized During Treatment: Rolling walker (2 wheels)  OT Visit Diagnosis: Unsteadiness on feet (R26.81);Muscle weakness (generalized) (M62.81)    Activity Tolerance Patient tolerated treatment well   Patient Left with call bell/phone within reach;in bed   Nurse Communication          Time: 4098-1191 OT Time Calculation (min): 14 min  Charges: OT General Charges $OT Visit: 1 Visit OT Evaluation $OT Re-eval: 1 Re-eval OT Treatments $Self Care/Home Management : 8-22 mins  Glenard Haring, OTS

## 2023-04-23 NOTE — Progress Notes (Addendum)
Physical Therapy Treatment Patient Details Name: Adam Cabrera MRN: [id number] DOB: 10-26-1985 Today's Date: 04/23/2023   History of Present Illness 37 y.o. male with medical history significant for hypertension, scrotal cellulitis, history of IV drug use, presents with low back pain.  MRI of the lumbar spine showed Epidural extension with epidural phlegmon/abscess within the ventral epidural space extending from L1-2 through L3-4 and s/p lumbar laminectomy; Pt is now s/p L1-4 PSF with right L3 facetectomy 04/10/23.     PT Comments  Pt received in supine position and agreeable to therapy.  Pt able to don TLSO brace without any difficulty. Pt then ambulated to the therapy gym in order to perform stair training.  Pt does have two handrails, and notes that he might be able to reach both sides, but it would be difficult to do so.  Pt encouraged to attempt to ambulate up the stairs using both handrails, then attempt a second attempt with one handrail.  Pt performed those well without any difficulty.  Pt then transferred back to the bed and left with all needs met.  Will continue to benefit from continued mobilization.       If plan is discharge home, recommend the following: Assistance with cooking/housework;Assist for transportation;Help with stairs or ramp for entrance;A little help with walking and/or transfers;A little help with bathing/dressing/bathroom   Can travel by private vehicle        Equipment Recommendations  Other (comment) (TBD closer to d/c, possible Rw)    Recommendations for Other Services       Precautions / Restrictions Precautions Precautions: Back;Fall Precaution Booklet Issued: No Precaution Comments: Log Roll Required Braces or Orthoses: Spinal Brace Spinal Brace: Thoracolumbosacral orthotic Spinal Brace Comments: MinA to don TLSO Restrictions Weight Bearing Restrictions: No     Mobility  Bed Mobility Overal bed mobility: Modified Independent Bed Mobility: Supine  to Sit, Sit to Supine Rolling: Supervision, Used rails, Modified independent (Device/Increase time) Sidelying to sit: Supervision Supine to sit: Supervision, HOB elevated Sit to supine: Supervision, HOB elevated Sit to sidelying: HOB elevated, Used rails, Supervision General bed mobility comments: Indep brought legs onto bed    Transfers Overall transfer level: Needs assistance Equipment used: Rolling walker (2 wheels) Transfers: Sit to/from Stand Sit to Stand: From elevated surface, Supervision           General transfer comment: Sit to stand from low toilet with Min/ModA    Ambulation/Gait Ambulation/Gait assistance: Supervision Gait Distance (Feet): 400 Feet Assistive device: Rolling walker (2 wheels) Gait Pattern/deviations: Step-through pattern Gait velocity: WNL     General Gait Details: pt continues to tolerate ambulation and OOB activity better each day. recommended pt get up and ambulate more freqently.   Stairs Stairs: Yes Stairs assistance: Supervision Stair Management: Two rails, Alternating pattern, One rail Left, Forwards Number of Stairs: 4 General stair comments: two separate attempts, first alternating pattern with 2 handrails, the second alternating with one handrail to mimic home set up.   Wheelchair Mobility     Tilt Bed    Modified Rankin (Stroke Patients Only)       Balance Overall balance assessment: Needs assistance Sitting-balance support: Feet supported Sitting balance-Leahy Scale: Good     Standing balance support: Bilateral upper extremity supported, During functional activity, Reliant on assistive device for balance Standing balance-Leahy Scale: Good Standing balance comment: Able to maintain static standing balance during functional activities  Cognition Arousal: Alert Behavior During Therapy: WFL for tasks assessed/performed Overall Cognitive Status: Within Functional Limits for tasks  assessed                                 General Comments: pt is A and O x 4. cooperative throughout        Exercises      General Comments        Pertinent Vitals/Pain Pain Assessment Pain Assessment: 0-10 Pain Score: 8  Pain Intervention(s): Monitored during session, Patient requesting pain meds-RN notified    Home Living                          Prior Function            PT Goals (current goals can now be found in the care plan section) Acute Rehab PT Goals Patient Stated Goal: Go Home (Go home) PT Goal Formulation: With patient Time For Goal Achievement: 04/25/23 Potential to Achieve Goals: Good Progress towards PT goals: Progressing toward goals    Frequency    Min 1X/week      PT Plan      Co-evaluation              AM-PAC PT "6 Clicks" Mobility   Outcome Measure  Help needed turning from your back to your side while in a flat bed without using bedrails?: None Help needed moving from lying on your back to sitting on the side of a flat bed without using bedrails?: None Help needed moving to and from a bed to a chair (including a wheelchair)?: A Little Help needed standing up from a chair using your arms (e.g., wheelchair or bedside chair)?: A Little Help needed to walk in hospital room?: A Little Help needed climbing 3-5 steps with a railing? : A Little 6 Click Score: 20    End of Session Equipment Utilized During Treatment: Back brace;Other (comment) Activity Tolerance: Patient tolerated treatment well Patient left: in chair;with call bell/phone within reach;with chair alarm set Nurse Communication: Mobility status PT Visit Diagnosis: Pain;Other abnormalities of gait and mobility (R26.89);Muscle weakness (generalized) (M62.81) Pain - part of body:  (back)     Time: 9528-4132 PT Time Calculation (min) (ACUTE ONLY): 18 min  Charges:    $Therapeutic Activity: 8-22 mins PT General Charges $$ ACUTE PT VISIT: 1  Visit                     Nolon Bussing, PT, DPT Physical Therapist - [place]  [facility]  04/23/23, 11:39 AM

## 2023-04-23 NOTE — Plan of Care (Signed)
Problem: Education: Goal: Knowledge of General Education information will improve Description: Including pain rating scale, medication(s)/side effects and non-pharmacologic comfort measures Outcome: Progressing   Problem: Health Behavior/Discharge Planning: Goal: Ability to manage health-related needs will improve Outcome: Progressing   Problem: Clinical Measurements: Goal: Diagnostic test results will improve Outcome: Progressing Goal: Cardiovascular complication will be avoided Outcome: Progressing   Problem: Activity: Goal: Risk for activity intolerance will decrease Outcome: Progressing   Problem: Nutrition: Goal: Adequate nutrition will be maintained Outcome: Progressing   Problem: Coping: Goal: Level of anxiety will decrease Outcome: Progressing   Problem: Elimination: Goal: Will not experience complications related to bowel motility Outcome: Progressing Goal: Will not experience complications related to urinary retention Outcome: Progressing   Problem: Pain Managment: Goal: General experience of comfort will improve Outcome: Progressing

## 2023-04-23 NOTE — Plan of Care (Signed)

## 2023-04-24 ENCOUNTER — Encounter: Payer: Medicaid Other | Admitting: Neurosurgery

## 2023-04-24 DIAGNOSIS — M4646 Discitis, unspecified, lumbar region: Secondary | ICD-10-CM | POA: Diagnosis not present

## 2023-04-24 DIAGNOSIS — R7881 Bacteremia: Secondary | ICD-10-CM | POA: Diagnosis not present

## 2023-04-24 DIAGNOSIS — G061 Intraspinal abscess and granuloma: Secondary | ICD-10-CM | POA: Diagnosis not present

## 2023-04-24 DIAGNOSIS — K6812 Psoas muscle abscess: Secondary | ICD-10-CM | POA: Diagnosis not present

## 2023-04-24 LAB — COMPREHENSIVE METABOLIC PANEL
ALT: 12 U/L (ref 0–44)
AST: 15 U/L (ref 15–41)
Albumin: 3.1 g/dL — ABNORMAL LOW (ref 3.5–5.0)
Alkaline Phosphatase: 60 U/L (ref 38–126)
Anion gap: 7 (ref 5–15)
BUN: 9 mg/dL (ref 6–20)
CO2: 25 mmol/L (ref 22–32)
Calcium: 8.2 mg/dL — ABNORMAL LOW (ref 8.9–10.3)
Chloride: 105 mmol/L (ref 98–111)
Creatinine, Ser: 0.52 mg/dL — ABNORMAL LOW (ref 0.61–1.24)
GFR, Estimated: >60 mL/min (ref >60)
Glucose, Bld: 118 mg/dL — ABNORMAL HIGH (ref 70–99)
Potassium: 3.3 mmol/L — ABNORMAL LOW (ref 3.5–5.1)
Sodium: 137 mmol/L (ref 135–145)
Total Bilirubin: 1.1 mg/dL (ref 0.3–1.2)
Total Protein: 6.5 g/dL (ref 6.5–8.1)

## 2023-04-24 LAB — CBC
HCT: 28 % — ABNORMAL LOW (ref 39.0–52.0)
Hemoglobin: 8.6 g/dL — ABNORMAL LOW (ref 13.0–17.0)
MCH: 28.6 pg (ref 26.0–34.0)
MCHC: 30.7 g/dL (ref 30.0–36.0)
MCV: 93 fL (ref 80.0–100.0)
Platelets: 115 10*3/uL — ABNORMAL LOW (ref 150–400)
RBC: 3.01 MIL/uL — ABNORMAL LOW (ref 4.22–5.81)
RDW: 20.7 % — ABNORMAL HIGH (ref 11.5–15.5)
WBC: 3.2 10*3/uL — ABNORMAL LOW (ref 4.0–10.5)
nRBC: 0 % (ref 0.0–0.2)

## 2023-04-24 MED ORDER — OXYCODONE HCL 5 MG PO TABS
10.0000 mg | ORAL_TABLET | Freq: Four times a day (QID) | ORAL | Status: DC | PRN
  Administered 2023-04-24 – 2023-05-13 (×59): 10 mg via ORAL
  Filled 2023-04-24 (×60): qty 2

## 2023-04-24 MED ORDER — POTASSIUM CHLORIDE CRYS ER 20 MEQ PO TBCR
40.0000 meq | EXTENDED_RELEASE_TABLET | Freq: Once | ORAL | Status: AC
  Administered 2023-04-24: 40 meq via ORAL
  Filled 2023-04-24: qty 2

## 2023-04-24 NOTE — Progress Notes (Signed)
   04/24/23 1500  Spiritual Encounters  Type of Visit Initial  Care provided to: Patient  Referral source Chaplain assessment  Reason for visit Routine spiritual support  OnCall Visit No   Chaplain visited with patient providing compassionate care and reflective listening. Chaplain spiritual support services remain available as the need arises.

## 2023-04-24 NOTE — Plan of Care (Signed)
Problem: Education: Goal: Knowledge of General Education information will improve Description: Including pain rating scale, medication(s)/side effects and non-pharmacologic comfort measures Outcome: Progressing   Problem: Pain Managment: Goal: General experience of comfort will improve Outcome: Progressing   Problem: Skin Integrity: Goal: Risk for impaired skin integrity will decrease Outcome: Progressing   Problem: Activity: Goal: Risk for activity intolerance will decrease Outcome: Progressing

## 2023-04-24 NOTE — Progress Notes (Signed)
[facility]  - [facility]   PATIENT NAME: Adam Cabrera    MR#:  [id number]  DATE OF BIRTH:  1985-12-26  SUBJECTIVE:  laying in bed comfortably. Walked with physical therapy outside the room. Seen by neurosurgery PA and incision looks okay. They've signed off. Just with patient regarding pain meds and now that he's two weeks out of last surgery we need DC IV pain meds and use PO as needed   VITALS:  Blood pressure 109/68, pulse 77, temperature 98.1 F (36.7 C), resp. rate 18, height 6\' 2"  (1.88 m), weight 116.4 kg, SpO2 99%.  PHYSICAL EXAMINATION:   GENERAL:  37 y.o.-year-old patient with no acute distress.  LUNGS: Normal breath sounds bilaterally, no wheezing CARDIOVASCULAR: S1, S2 normal. No murmur   ABDOMEN: Soft, nontender, nondistended. Surgical incision stable EXTREMITIES: No  edema b/l.    NEUROLOGIC: nonfocal  patient is alert and awake SKIN: tatto +  LABORATORY PANEL:  CBC Recent Labs  Lab 04/24/23 0229  WBC 3.2*  HGB 8.6*  HCT 28.0*  PLT 115*    Chemistries  Recent Labs  Lab 04/19/23 0500 04/21/23 0251 04/24/23 0229  NA  --    < > 137  K  --    < > 3.3*  CL  --    < > 105  CO2  --    < > 25  GLUCOSE  --    < > 118*  BUN  --    < > 9  CREATININE  --    < > 0.52*  CALCIUM  --    < > 8.2*  MG 1.9  --   --   AST  --    < > 15  ALT  --    < > 12  ALKPHOS  --    < > 60  BILITOT  --    < > 1.1   < > = values in this interval not displayed.    Assessment and Plan  37 y.o. male with medical history significant for hypertension, scrotal cellulitis, history of IV drug use, presents with low back pain.  MRI of the lumbar spine showed Epidural extension with epidural phlegmon/abscess within the ventral epidural space extending from L1-2 through L3-4.  Blood culture positive for Staph aureus, patient treated with cefepime and vancomycin.  Patient has been seen by ID and neurosurgery, L spine washout performed 8/19.   Patient had a  worsening back pain since 9/3, Patient was brought to the OR again 9/5. Had: 1. Transforaminal Lumbar Interbody Fusion L2-3.  2. Posterolateral arthrodesis L1 to L4 3. Posterior segmental instrumentation L1 to L4; 4.  Lumbar decompression with total facetectomy on the right at L2-3;  5.  Allograft placement in the 2 3 disc space for arthrodesis.   One of the wound cultures from 9/5 growing rare Staph aureus.   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  On 9/5, patient had a another neurosurgical procedure which included transforaminal lumbar interbody fusion of L2/L3, posterolateral arthrodesis L1-L4, posterior segment instrumentation L1-L4, lumbar decompression with total fasciotomy on the right at L2-L3, allograft placement in the 2/3 disc space for arthrodesis.   --ID recommends 6 weeks total of IV antibiotics.  Since one of the cultures from 9/5 growing Staph aureus may have to be 6 weeks of antibiotics from that culture.  MSSA (methicillin susceptible Staphylococcus aureus) septicemia (HCC) --Present on  admission with tachycardia, tachypnea and epidural abscess. --  Continue continuous IV cefazolin and rifampin.   Psoas abscess, left (HCC) --Continue abxs as above   Thrombocytopenia (HCC) -- Hepatitis C viral load positive, hepatitis A negative, hepatitis B surface antigen negative -plt stable at 115 (129)   BRBPR --Anusol suppository as needed--improved   Obesity (BMI 30-39.9) BMI 32.92   Hypokalemia Replaced   Iron deficiency anemia --Last hemoglobin 8.6.   --Combination of iron deficiency anemia and postoperative anemia.   --Patient on oral iron.  -- Likely hemorrhoids also contributing to anemia.  Anusol suppository as needed.    Headache As needed Fioricet.   Hyponatremia Resolved   Back pain Continue MS Contin 15 mg twice a day.  Continue as needed oxycodone.   --9/19-- discussed with patient regarding pain meds MS Contin 15 mg BID and oxycodone 10 mg Q6 PRN. IV Dilaudid discontinued. Patient agreeable.  CODE STATUS: full DVT Prophylaxis :lovenox Level of care: Med-Surg Status is: Inpatient Remains inpatient appropriate because: Needs to complete IV abxs in the hospital    TOTAL TIME TAKING CARE OF THIS PATIENT: 25 minutes.  >50% time spent on counselling and coordination of care  Note: This dictation was prepared with Dragon dictation along with smaller phrase technology. Any transcriptional errors that result from this process are unintentional.  Enedina Finner M.D    [facility]   CC: Primary care physician; Patient, No Pcp Per

## 2023-04-24 NOTE — Progress Notes (Signed)
Physical Therapy Treatment Patient Details Name: Adam Cabrera MRN: [id number] DOB: 07/28/1986 Today's Date: 04/24/2023   History of Present Illness 37 y.o. male with medical history significant for hypertension, scrotal cellulitis, history of IV drug use, presents with low back pain.  MRI of the lumbar spine showed Epidural extension with epidural phlegmon/abscess within the ventral epidural space extending from L1-2 through L3-4 and s/p lumbar laminectomy; Pt is now s/p L1-4 PSF with right L3 facetectomy 04/10/23.    PT Comments  Pt was pleasant and motivated to participate during the session and put forth good effort throughout. Pt needs supervision assist for bed mobility, is able to perform log roll without cues. Once up pt amb extended distance from room to front of hospital and back with seated rest break between. Pt needing initial cues for upright posture,  able to maintain good posture with good quality gait throughout ambulatory bout. Pt will benefit from continued PT services upon discharge to safely address deficits listed in patient problem list for decreased caregiver assistance and eventual return to PLOF.      If plan is discharge home, recommend the following: Assistance with cooking/housework;Assist for transportation;Help with stairs or ramp for entrance;A little help with walking and/or transfers;A little help with bathing/dressing/bathroom   Can travel by private vehicle        Equipment Recommendations  Other (comment) (TBD closer to d/c, possible Rw)    Recommendations for Other Services       Precautions / Restrictions Precautions Precautions: Back;Fall Precaution Comments: Log Roll Required Braces or Orthoses: Spinal Brace Spinal Brace: Thoracolumbosacral orthotic Restrictions Weight Bearing Restrictions: No     Mobility  Bed Mobility Overal bed mobility: Needs Assistance Bed Mobility: Rolling, Sit to Sidelying, Sidelying to Sit Rolling: Supervision, Used  rails Sidelying to sit: Supervision, Used rails     Sit to sidelying: HOB elevated, Used rails, Supervision General bed mobility comments: no cues for log rolling needed    Transfers Overall transfer level: Modified independent Equipment used: Rolling walker (2 wheels) Transfers: Sit to/from Stand Sit to Stand: Supervision           General transfer comment: STS from EOB and WC supervision level    Ambulation/Gait Ambulation/Gait assistance: Supervision Gait Distance (Feet):  (pt able to walk to front of hospital and back, grocery store distance; with ease) Assistive device: Rolling walker (2 wheels) Gait Pattern/deviations: Step-through pattern, WFL(Within Functional Limits) Gait velocity: WNL     General Gait Details: Pt taking steady steps with RW, good pace. needing initial cues for uprigh posture but able to maintin good gait quality during long bout of ambulation to front of Actuary     Tilt Bed    Modified Rankin (Stroke Patients Only)       Balance Overall balance assessment: Needs assistance Sitting-balance support: Feet supported Sitting balance-Leahy Scale: Good     Standing balance support: Bilateral upper extremity supported, During functional activity, Reliant on assistive device for balance Standing balance-Leahy Scale: Good Standing balance comment: during static stand with RW                            Cognition Arousal: Alert Behavior During Therapy: WFL for tasks assessed/performed Overall Cognitive Status: Within Functional Limits for tasks assessed  Exercises      General Comments        Pertinent Vitals/Pain Pain Assessment Pain Assessment: 0-10 Pain Score: 8  Pain Location: back Pain Descriptors / Indicators: Discomfort, Sore Pain Intervention(s): Monitored during session    Home Living                           Prior Function            PT Goals (current goals can now be found in the care plan section) Progress towards PT goals: Progressing toward goals    Frequency    Min 1X/week      PT Plan      Co-evaluation              AM-PAC PT "6 Clicks" Mobility   Outcome Measure  Help needed turning from your back to your side while in a flat bed without using bedrails?: None Help needed moving from lying on your back to sitting on the side of a flat bed without using bedrails?: None Help needed moving to and from a bed to a chair (including a wheelchair)?: A Little Help needed standing up from a chair using your arms (e.g., wheelchair or bedside chair)?: A Little Help needed to walk in hospital room?: A Little Help needed climbing 3-5 steps with a railing? : A Little 6 Click Score: 20    End of Session Equipment Utilized During Treatment: Back brace;Other (comment) Activity Tolerance: Patient tolerated treatment well Patient left: in bed;with call bell/phone within reach Nurse Communication: Mobility status PT Visit Diagnosis: Pain;Other abnormalities of gait and mobility (R26.89);Muscle weakness (generalized) (M62.81) Pain - part of body:  (back)     Time: 3244-0102 PT Time Calculation (min) (ACUTE ONLY): 21 min  Charges:                            Cecile Sheerer, SPT 04/24/23, 10:54 AM

## 2023-04-24 NOTE — Progress Notes (Signed)
Date of Admission:  03/23/2023      ID: Adam Cabrera is a 37 y.o. 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)   Sepsis without acute organ dysfunction (HCC)   Hepatitis C    Subjective: Doing better since he had fusion surgery on 04/10/23 Walked many rounds in the corridor today  Medications:   acetaminophen  1,000 mg Oral Q6H   ammonium lactate   Topical BID   enoxaparin (LOVENOX) injection  40 mg Subcutaneous Q24H   ferrous sulfate  325 mg Oral Q breakfast   gabapentin  300 mg Oral TID   methocarbamol  750 mg Oral Q6H   morphine  15 mg Oral Q12H   nicotine  21 mg Transdermal Daily   rifampin  300 mg Oral Q12H   senna-docusate  2 tablet Oral BID   sodium chloride flush  3 mL Intravenous Q12H   sodium chloride flush  3 mL Intravenous Q12H   terbinafine   Topical BID   traZODone  50 mg Oral QHS    Objective: Vital signs in last 24 hours: Patient Vitals for the past 24 hrs:  BP Temp Pulse Resp SpO2  04/24/23 0726 109/68 98.1 F (36.7 C) 77 18 99 %  04/24/23 0039 117/67 97.7 F (36.5 C) 84 18 100 %  04/23/23 1717 130/78 97.9 F (36.6 C) 80 12 100 %      PHYSICAL EXAM:  General: Alert, no distress  Lungs: Clear to auscultation bilaterally. No Wheezing or Rhonchi. No rales. Heart: Regular rate and rhythm, no murmur, rub or gallop. Spine - surgial site healed well- no erythema or discharge   Lab Results    Latest Ref Rng & Units 04/24/2023    2:29 AM 04/22/2023     4:16 AM 04/21/2023    2:51 AM  CBC  WBC 4.0 - 10.5 K/uL 3.2  3.0  3.7   Hemoglobin 13.0 - 17.0 g/dL 8.6  8.5  8.6   Hematocrit 39.0 - 52.0 % 28.0  27.7  28.7   Platelets 150 - 400 K/uL 115  129  111        Latest Ref Rng & Units 04/24/2023    2:29 AM 04/21/2023    2:51 AM 04/19/2023    4:57 AM  CMP  Glucose 70 - 99 mg/dL 478  99    BUN 6 - 20 mg/dL 9  8    Creatinine 2.95 - 1.24 mg/dL 6.21  3.08    Sodium 657 - 145 mmol/L 137  135    Potassium 3.5 - 5.1 mmol/L 3.3  4.0  3.6   Chloride 98 - 111 mmol/L 105  104    CO2 22 - 32 mmol/L 25  26    Calcium 8.9 - 10.3 mg/dL 8.2  8.4    Total Protein 6.5 - 8.1 g/dL 6.5  6.4    Total Bilirubin 0.3 - 1.2 mg/dL 1.1  1.5  Alkaline Phos 38 - 126 U/L 60  52    AST 15 - 41 U/L 15  17    ALT 0 - 44 U/L 12  11        Microbiology: 8/18 [facility]- MSSA 8/19 Surgical culture staph aureus 8/21 BC NG 9/5 Surgical culture MSSA   Assessment/Plan: MSSA bacteremia 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 Posterolateral arthrodesis L1 to L4 3. Posterior segmental instrumentation L1 to L4  4.  Lumbar decompression with total facetectomy on the right at L2-3 5.  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

## 2023-04-24 NOTE — Progress Notes (Signed)
Mobility Specialist - Progress Note   04/24/23 1419  Mobility  Activity Ambulated with assistance in hallway  Level of Assistance Standby assist, set-up cues, supervision of patient - no hands on  Assistive Device Front wheel walker  Distance Ambulated (ft) 320 ft  Activity Response Tolerated well  $Mobility charge 1 Mobility  Mobility Specialist Start Time (ACUTE ONLY) 1404  Mobility Specialist Stop Time (ACUTE ONLY) 1412  Mobility Specialist Time Calculation (min) (ACUTE ONLY) 8 min   Pt supine upon entry, utilizing RA. Pt agreeable to OOB amb in the hallway this date. Pt completed bed mob via log roll, tolerated well. Pt dons back brace with MinA, STS to RW SBA. Pt amb two laps around the NS with supervision, no LOB. Pt returned to the room, left supine with needs within reach.  Zetta Bills Mobility Specialist 04/24/23 2:23 PM

## 2023-04-24 NOTE — Plan of Care (Signed)
Problem: Education: Goal: Knowledge of General Education information will improve Description: Including pain rating scale, medication(s)/side effects and non-pharmacologic comfort measures Outcome: Progressing   Problem: Activity: Goal: Risk for activity intolerance will decrease Outcome: Progressing   Problem: Nutrition: Goal: Adequate nutrition will be maintained Outcome: Progressing

## 2023-04-24 NOTE — Progress Notes (Signed)
   Neurosurgery Progress Note  History: [name] is s/p L2-3 decompression for epidural phlegmon.  POD31/14: Pt reports improved pain and strength. Denies any concerns this morning. POD22/5: Pt reports improved pain and right quad strength. He ambulated some with therapy yesterday POD18/1: Patient urinating severe back pain.  Reports improvement of his radiating leg pain this morning. POD17: Continue back and R>L posterior thigh pain.  POD16: Patient reports an increase of low back pain and right posterior radiating leg pain yesterday after physical therapy which has persisted today.  He has had worsening right leg weakness over the last 24 hours. POD4: Improved pain this morning POD3: NAEO POD2: continued back pain. Improved with some medication changes.  POD1: improved leg pain and strength overnight.  Physical Exam: Vitals:   04/24/23 0039 04/24/23 0726  BP: 117/67 109/68  Pulse: 84 77  Resp: 18 18  Temp: 97.7 F (36.5 C) 98.1 F (36.7 C)  SpO2: 100% 99%    AA Ox3  CNI  Strength:5/5 throughout BLE  Incision healing well.  Data:  Other tests/results:  Cultures positive for abundant Staph aureus.  Sensitivities pending. Gram stain showing gram positive cocci  MRI L spine 04/09/23  IMPRESSION: 1. Persistent findings of osteomyelitis discitis at L2-3 with mildly progressive vertebral body height loss and endplate erosion since previous. Persistent epidural involvement with ventral epidural phlegmon/abscess as above. 2. Sequelae of interval decompressive laminectomy at L2-3. Persistent fairly severe canal and bilateral subarticular stenosis at this level, although overall appears somewhat improved as compared to preoperative exam from 03/23/2023. 3. Associated paraspinous inflammatory changes with bilateral psoas abscesses as above. 4. No other new or progressive findings elsewhere within the lumbar spine.     Electronically Signed   By: Rise Mu  M.D.   On: 04/10/2023 03:07  Assessment/Plan:  Adam Cabrera is a 37 year old presenting with an L2-3 abscess and right greater than left lower extremity weakness status post L2-3 decompression for evacuation of epidural phlegmon.  Unfortunately has had a worsening of symptoms and increased pain overnight prompting a CT lumbar spine showing worsening collapse at L2-3 and L3-4. S/p L1-4 PSF with right L3 facetectomy.  - pain control - DVT prophylaxis - TLSO brace when OOB and ambulating.   - HV and wound vac removed. - will plan to follow up outpatient. Please call with any questions or concerns.  Manning Charity PA-C Department of Neurosurgery

## 2023-04-25 DIAGNOSIS — G061 Intraspinal abscess and granuloma: Secondary | ICD-10-CM | POA: Diagnosis not present

## 2023-04-25 NOTE — Progress Notes (Signed)
Mobility Specialist - Progress Note   04/25/23 1605  Mobility  Activity Ambulated with assistance in hallway  Level of Assistance Standby assist, set-up cues, supervision of patient - no hands on  Assistive Device Front wheel walker  Distance Ambulated (ft) 800 ft  Activity Response Tolerated well  $Mobility charge 1 Mobility  Mobility Specialist Start Time (ACUTE ONLY) 1540  Mobility Specialist Stop Time (ACUTE ONLY) 1559  Mobility Specialist Time Calculation (min) (ACUTE ONLY) 19 min   Pt supine upon entry, utilizing RA. Pt willing/agreeable to OOB amb this date. Pt completed bed mob and STS to RW indep. Pt amb 5 laps around the NS supervision, expressing pain however tolerable. Pt amb with a steady and consistent gait, no LOB. Pt returned to the room, left supine with needs within reach.  Zetta Bills Mobility Specialist 04/25/23 4:08 PM

## 2023-04-25 NOTE — Progress Notes (Signed)
[facility]  - [place] at [facility]   PATIENT NAME: Adam Cabrera    MR#:  [id number]  DATE OF BIRTH:  02/14/1986  SUBJECTIVE:  No new issues other than some back pain Ambulating in and outside the room  VITALS:  Blood pressure 126/75, pulse 74, temperature 97.8 F (36.6 C), resp. rate 16, height 6\' 2"  (1.88 m), weight 118.2 kg, SpO2 99%.  PHYSICAL EXAMINATION:   GENERAL:  37 y.o.-year-old patient with no acute distress.  LUNGS: Normal breath sounds bilaterally, no wheezing CARDIOVASCULAR: S1, S2 normal. No murmur   ABDOMEN: Soft, nontender, nondistended. Surgical incision stable EXTREMITIES: No  edema b/l.    NEUROLOGIC: nonfocal  patient is alert and awake SKIN: tatto +  LABORATORY PANEL:  CBC Recent Labs  Lab 04/24/23 0229  WBC 3.2*  HGB 8.6*  HCT 28.0*  PLT 115*    Chemistries  Recent Labs  Lab 04/19/23 0500 04/21/23 0251 04/24/23 0229  NA  --    < > 137  K  --    < > 3.3*  CL  --    < > 105  CO2  --    < > 25  GLUCOSE  --    < > 118*  BUN  --    < > 9  CREATININE  --    < > 0.52*  CALCIUM  --    < > 8.2*  MG 1.9  --   --   AST  --    < > 15  ALT  --    < > 12  ALKPHOS  --    < > 60  BILITOT  --    < > 1.1   < > = values in this interval not displayed.    Assessment and Plan  37 y.o. male with medical history significant for hypertension, scrotal cellulitis, history of IV drug use, presents with low back pain.  MRI of the lumbar spine showed Epidural extension with epidural phlegmon/abscess within the ventral epidural space extending from L1-2 through L3-4.  Blood culture positive for Staph aureus, patient treated with cefepime and vancomycin.  Patient has been seen by ID and neurosurgery, L spine washout performed 8/19.   Patient had a worsening back pain since 9/3, Patient was brought to the OR again 9/5. Had: 1. Transforaminal Lumbar Interbody Fusion L2-3.  2. Posterolateral arthrodesis L1 to L4 3. Posterior segmental  instrumentation L1 to L4; 4.  Lumbar decompression with total facetectomy on the right at L2-3;  5.  Allograft placement in the 2 3 disc space for arthrodesis.   One of the wound cultures from 9/5 growing rare Staph aureus.   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  On 9/5, patient had a another neurosurgical procedure which included transforaminal lumbar interbody fusion of L2/L3, posterolateral arthrodesis L1-L4, posterior segment instrumentation L1-L4, lumbar decompression with total fasciotomy on the right at L2-L3, allograft placement in the 2/3 disc space for arthrodesis.   --ID recommends 6 weeks total of IV antibiotics.  Since one of the cultures from 9/5 growing Staph aureus may have to be 6 weeks of antibiotics from that culture.  MSSA (methicillin susceptible Staphylococcus aureus) septicemia (HCC) --Present on admission with tachycardia, tachypnea and epidural abscess. --  Continue continuous IV cefazolin and rifampin--till 05/22/2023   Psoas abscess, left (HCC) --Continue abxs as above   Thrombocytopenia (HCC) -- Hepatitis C viral load positive,  hepatitis A negative, hepatitis B surface antigen negative -plt stable at 115 (129)   BRBPR --Anusol suppository as needed--improved   Obesity (BMI 30-39.9) BMI 32.92   Hypokalemia Replaced   Iron deficiency anemia --Last hemoglobin 8.6.   --Combination of iron deficiency anemia and postoperative anemia.   --Patient on oral iron.  -- Likely hemorrhoids also contributing to anemia.  Anusol suppository as needed.    Headache As needed Fioricet.   Hyponatremia Resolved   Back pain Continue MS Contin 15 mg twice a day.  Continue as needed oxycodone.  --9/19-- discussed with patient regarding pain meds MS Contin 15 mg BID and oxycodone 10 mg Q6 PRN. IV Dilaudid discontinued. Patient agreeable.  CODE STATUS: full DVT  Prophylaxis :lovenox Level of care: Med-Surg Status is: Inpatient Remains inpatient appropriate because: Needs to complete IV abxs in the hospital    TOTAL TIME TAKING CARE OF THIS PATIENT: 25 minutes.  >50% time spent on counselling and coordination of care  Note: This dictation was prepared with Dragon dictation along with smaller phrase technology. Any transcriptional errors that result from this process are unintentional.  Enedina Finner M.D    [facility]   CC: Primary care physician; Patient, No Pcp Per

## 2023-04-25 NOTE — Progress Notes (Addendum)
Mobility Specialist - Progress Note     04/25/23 1100  Mobility  Activity Ambulated with assistance in hallway  Level of Assistance Standby assist, set-up cues, supervision of patient - no hands on  Assistive Device Front wheel walker  Distance Ambulated (ft) 480 ft  Range of Motion/Exercises Active  Activity Response Tolerated well  Mobility Referral Yes  $Mobility charge 1 Mobility  Mobility Specialist Stop Time (ACUTE ONLY) 1108   Pt resting in bed on RA upon entry. Pt dons back brace with assistance. Pt STS independent of RW and ambulates to hallway around NS for 3 laps SBA with RW. Pt endorses discomfort in back but, says its tolerable. Pt gait remained steady and consistent throughout the session. Pt returned to bed and left with needs in reach.   Johnathan Hausen Mobility Specialist 04/25/23, 11:56 AM

## 2023-04-26 DIAGNOSIS — G061 Intraspinal abscess and granuloma: Secondary | ICD-10-CM | POA: Diagnosis not present

## 2023-04-26 NOTE — Plan of Care (Signed)

## 2023-04-26 NOTE — Progress Notes (Signed)
Occupational Therapy Treatment Patient Details Name: Adam Cabrera MRN: [id number] DOB: 1986/07/19 Today's Date: 04/26/2023   History of present illness 37 y.o. male with medical history significant for hypertension, scrotal cellulitis, history of IV drug use, presents with low back pain.  MRI of the lumbar spine showed Epidural extension with epidural phlegmon/abscess within the ventral epidural space extending from L1-2 through L3-4 and s/p lumbar laminectomy; Pt is now s/p L1-4 PSF with right L3 facetectomy 04/10/23.   OT comments  Upon entering the room, pt having just finished ambulation with mobility team and returning to room as therapist arrives. Pt able to doff TLSO brace while seated on EOB without assistance. OT discussed how to utilize circle sitting and figure four for LB ADLs and pt able to demonstrate circle sitting on EOB to don and doff B socks without assistance. Discussed need for LH sponge for bathing and need to sit on toilet or EOB for dressing at home. OT also discussing energy conservation techniques for home and in community with pt being an active participant with education. Pt is close to goal level and will reassess at next session. All needs within reach and pt performs log roll back to bed at mod I level.       If plan is discharge home, recommend the following:  A little help with walking and/or transfers;Assist for transportation;Assistance with cooking/housework;Help with stairs or ramp for entrance;A little help with bathing/dressing/bathroom   Equipment Recommendations  BSC/3in1;Tub/shower seat       Precautions / Restrictions Precautions Precautions: Back;Fall Spinal Brace: Thoracolumbosacral orthotic       Mobility Bed Mobility Overal bed mobility: Needs Assistance Bed Mobility: Sit to Supine       Sit to supine: Modified independent (Device/Increase time)        Transfers Overall transfer level: Modified independent Equipment used:  None Transfers: Sit to/from Stand Sit to Stand: Supervision                 Balance Overall balance assessment: Needs assistance Sitting-balance support: Feet supported Sitting balance-Leahy Scale: Good                                     ADL either performed or assessed with clinical judgement   ADL                                         General ADL Comments: Pt able to doff TLSO himself while seated on EOB and demonstrates circle sitting to don/doff socks without assistance.    Extremity/Trunk Assessment Upper Extremity Assessment Upper Extremity Assessment: Overall WFL for tasks assessed   Lower Extremity Assessment Lower Extremity Assessment: Generalized weakness        Vision Patient Visual Report: No change from baseline            Cognition Arousal: Alert Behavior During Therapy: WFL for tasks assessed/performed Overall Cognitive Status: Within Functional Limits for tasks assessed                                 General Comments: pt is A and O x 4. cooperative throughout                   Pertinent  Vitals/ Pain       Pain Assessment Pain Assessment: No/denies pain         Frequency  Min 1X/week        Progress Toward Goals  OT Goals(current goals can now be found in the care plan section)  Progress towards OT goals: Progressing toward goals      AM-PAC OT "6 Clicks" Daily Activity     Outcome Measure   Help from another person eating meals?: None Help from another person taking care of personal grooming?: None Help from another person toileting, which includes using toliet, bedpan, or urinal?: A Little Help from another person bathing (including washing, rinsing, drying)?: A Little Help from another person to put on and taking off regular upper body clothing?: None Help from another person to put on and taking off regular lower body clothing?: A Little 6 Click Score: 21    End  of Session    OT Visit Diagnosis: Unsteadiness on feet (R26.81);Muscle weakness (generalized) (M62.81)   Activity Tolerance Patient tolerated treatment well   Patient Left with call bell/phone within reach;in bed   Nurse Communication Mobility status        Time: 4098-1191 OT Time Calculation (min): 13 min  Charges: OT General Charges $OT Visit: 1 Visit OT Treatments $Self Care/Home Management : 8-22 mins  Jackquline Denmark, MS, OTR/L , CBIS ascom [phone]  04/26/23, 12:01 PM

## 2023-04-26 NOTE — Progress Notes (Signed)
Mobility Specialist - Progress Note   04/26/23 1024  Mobility  Activity Ambulated independently in hallway;Stood at bedside;Dangled on edge of bed  Level of Assistance Independent  Assistive Device None  Distance Ambulated (ft) 800 ft  Activity Response Tolerated well  Mobility Referral Yes  $Mobility charge 1 Mobility  Mobility Specialist Start Time (ACUTE ONLY) 1001  Mobility Specialist Stop Time (ACUTE ONLY) 1019  Mobility Specialist Time Calculation (min) (ACUTE ONLY) 18 min   Pt supine in bed on RA upon arrival. Pt completes bed mobility, STS, and ambulates 5 laps around NS indep. Pt left in room with OT.   Adam Cabrera  Mobility Specialist  04/26/23 10:26 AM

## 2023-04-26 NOTE — Plan of Care (Signed)

## 2023-04-26 NOTE — Progress Notes (Signed)
[facility]  - [place] at [facility]   PATIENT NAME: Adam Cabrera    MR#:  [id number]  DATE OF BIRTH:  [birth year]  SUBJECTIVE:  No new issues other than some back pain Ambulating in and outside the room  VITALS:  Blood pressure 123/81, pulse 76, temperature 97.9 F (36.6 C), resp. rate 16, height 6\' 2"  (1.88 m), weight 120 kg, SpO2 100%.  PHYSICAL EXAMINATION:   GENERAL:  37 y.o.-year-old patient with no acute distress.  LUNGS: Normal breath sounds bilaterally, no wheezing CARDIOVASCULAR: S1, S2 normal. No murmur   ABDOMEN: Soft, nontender, nondistended. Surgical incision stable EXTREMITIES: No  edema b/l.    NEUROLOGIC: nonfocal  patient is alert and awake SKIN: tatto +  LABORATORY PANEL:  CBC Recent Labs  Lab 04/24/23 0229  WBC 3.2*  HGB 8.6*  HCT 28.0*  PLT 115*    Chemistries  Recent Labs  Lab 04/24/23 0229  NA 137  K 3.3*  CL 105  CO2 25  GLUCOSE 118*  BUN 9  CREATININE 0.52*  CALCIUM 8.2*  AST 15  ALT 12  ALKPHOS 60  BILITOT 1.1    Assessment and Plan  37 y.o. male with medical history significant for hypertension, scrotal cellulitis, history of IV drug use, presents with low back pain.  MRI of the lumbar spine showed Epidural extension with epidural phlegmon/abscess within the ventral epidural space extending from L1-2 through L3-4.  Blood culture positive for Staph aureus, patient treated with cefepime and vancomycin.  Patient has been seen by ID and neurosurgery, L spine washout performed 8/19.   Patient had a worsening back pain since 9/3, Patient was brought to the OR again 9/5. Had: 1. Transforaminal Lumbar Interbody Fusion L2-3.  2. Posterolateral arthrodesis L1 to L4 3. Posterior segmental instrumentation L1 to L4; 4.  Lumbar decompression with total facetectomy on the right at L2-3;  5.  Allograft placement in the 2 3 disc space for arthrodesis.   One of the wound cultures from 9/5 growing rare Staph aureus.   Epidural  abscess, L2-L5 Osteomyelitis (HCC) L2-3 Status post neurosurgical procedure for L2-L3 decompression for epidural phlegmon on 8/19.  On 9/5, patient had a another neurosurgical procedure which included transforaminal lumbar interbody fusion of L2/L3, posterolateral arthrodesis L1-L4, posterior segment instrumentation L1-L4, lumbar decompression with total fasciotomy on the right at L2-L3, allograft placement in the 2/3 disc space for arthrodesis.   --ID recommends 6 weeks total of IV antibiotics.  Since one of the cultures from 9/5 growing Staph aureus may have to be 6 weeks of antibiotics from that culture.  MSSA (methicillin susceptible Staphylococcus aureus) septicemia (HCC) --Present on admission with tachycardia, tachypnea and epidural abscess. --  Continue continuous IV cefazolin and rifampin--till 05/22/2023   Psoas abscess, left (HCC) --Continue abxs as above   Thrombocytopenia (HCC) -- Hepatitis C viral load positive, hepatitis A negative, hepatitis B surface antigen negative -plt stable at 115 (129)   BRBPR --Anusol suppository as needed--improved   Obesity (BMI 30-39.9) BMI 32.92   Hypokalemia Replaced   Iron deficiency anemia --Last hemoglobin 8.6.   --Combination of iron deficiency anemia and postoperative anemia.   --Patient on oral iron.  -- Likely hemorrhoids also contributing to anemia.  Anusol suppository as needed.    Headache As needed Fioricet.   Hyponatremia Resolved   Back pain Continue MS Contin 15 mg twice a day.  Continue as needed oxycodone.  --9/19-- discussed with patient regarding pain meds MS Contin 15 mg  BID and oxycodone 10 mg Q6 PRN. IV Dilaudid discontinued. Patient agreeable.  CODE STATUS: full DVT Prophylaxis :lovenox Level of care: Med-Surg Status is: Inpatient Remains inpatient appropriate because: Needs to complete IV abxs in the hospital    TOTAL TIME TAKING CARE OF THIS PATIENT: 25 minutes.  >50% time spent on counselling and  coordination of care  Note: This dictation was prepared with Dragon dictation along with smaller phrase technology. Any transcriptional errors that result from this process are unintentional.  Enedina Finner M.D    [facility]   CC: Primary care physician; Patient, No Pcp Per

## 2023-04-27 DIAGNOSIS — G061 Intraspinal abscess and granuloma: Secondary | ICD-10-CM | POA: Diagnosis not present

## 2023-04-27 NOTE — Plan of Care (Signed)
  Problem: Education: Goal: Knowledge of General Education information will improve Description: Including pain rating scale, medication(s)/side effects and non-pharmacologic comfort measures Outcome: Progressing   Problem: Nutrition: Goal: Adequate nutrition will be maintained Outcome: Progressing   Problem: Activity: Goal: Risk for activity intolerance will decrease Outcome: Progressing   Problem: Elimination: Goal: Will not experience complications related to bowel motility Outcome: Progressing Goal: Will not experience complications related to urinary retention Outcome: Progressing   Problem: Skin Integrity: Goal: Risk for impaired skin integrity will decrease Outcome: Progressing   Problem: Education: Goal: Knowledge of General Education information will improve Description: Including pain rating scale, medication(s)/side effects and non-pharmacologic comfort measures Outcome: Progressing

## 2023-04-27 NOTE — Progress Notes (Signed)
Physical Therapy Treatment Patient Details Name: Adam Cabrera MRN: [id number] DOB: 05-22-1986 Today's Date: 04/27/2023   History of Present Illness 37 y.o. male with medical history significant for hypertension, scrotal cellulitis, history of IV drug use, presents with low back pain.  MRI of the lumbar spine showed Epidural extension with epidural phlegmon/abscess within the ventral epidural space extending from L1-2 through L3-4 and s/p lumbar laminectomy; Pt is now s/p L1-4 PSF with right L3 facetectomy 04/10/23.    PT Comments  Pt ready for session.  Independent bed mobility, mobility in room and supervision gait in hallway and stairs.  He still needs light assist to don back brace.  Does not wear brace in room but does for gait in hallway with staff. He has HEP posted in room which he does several times a day "I try to do it whenever I get up to the bathroom."  Discussed discharge from PT at this time. He has progressed well towards goals, walking extended distances in hallway with mobility team and independent in room.  Discussed with primary PT.  Will DC PT at this time.   If plan is discharge home, recommend the following: Assistance with cooking/housework;Assist for transportation;Help with stairs or ramp for entrance;A little help with walking and/or transfers;A little help with bathing/dressing/bathroom   Can travel by private vehicle        Equipment Recommendations       Recommendations for Other Services       Precautions / Restrictions Precautions Precautions: Back;Fall Spinal Brace: Thoracolumbosacral orthotic Restrictions Weight Bearing Restrictions: No     Mobility  Bed Mobility Overal bed mobility: Modified Independent                  Transfers Overall transfer level: Modified independent                      Ambulation/Gait Ambulation/Gait assistance: Modified independent (Device/Increase time)   Assistive device: None Gait  Pattern/deviations: Step-through pattern Gait velocity: WNL         Stairs       Number of Stairs: 8 General stair comments: up/down x 2 with rails and no assist needed   Wheelchair Mobility     Tilt Bed    Modified Rankin (Stroke Patients Only)       Balance Overall balance assessment: Mild deficits observed, not formally tested                                          Cognition Arousal: Alert Behavior During Therapy: WFL for tasks assessed/performed Overall Cognitive Status: Within Functional Limits for tasks assessed                                          Exercises Other Exercises Other Exercises: verbal review of HEP posted in room.    General Comments        Pertinent Vitals/Pain Pain Assessment Pain Assessment: 0-10 Pain Score: 8  Pain Descriptors / Indicators: Discomfort, Sore Pain Intervention(s): Monitored during session    Home Living                          Prior Function  PT Goals (current goals can now be found in the care plan section) Progress towards PT goals: Progressing toward goals    Frequency    Min 1X/week      PT Plan      Co-evaluation              AM-PAC PT "6 Clicks" Mobility   Outcome Measure  Help needed turning from your back to your side while in a flat bed without using bedrails?: None Help needed moving from lying on your back to sitting on the side of a flat bed without using bedrails?: None Help needed moving to and from a bed to a chair (including a wheelchair)?: None Help needed standing up from a chair using your arms (e.g., wheelchair or bedside chair)?: None Help needed to walk in hospital room?: None Help needed climbing 3-5 steps with a railing? : None 6 Click Score: 24    End of Session Equipment Utilized During Treatment: Gait belt;Back brace Activity Tolerance: Patient tolerated treatment well Patient left: in bed;with call  bell/phone within reach Nurse Communication: Mobility status PT Visit Diagnosis: Pain;Other abnormalities of gait and mobility (R26.89);Muscle weakness (generalized) (M62.81) Pain - Right/Left: Right     Time: 1610-9604 PT Time Calculation (min) (ACUTE ONLY): 11 min  Charges:    $Gait Training: 8-22 mins PT General Charges $$ ACUTE PT VISIT: 1 Visit                     Danielle Dess, PTA 04/27/23, 2:50 PM

## 2023-04-27 NOTE — Progress Notes (Signed)
[facility]  - [place] at [facility]   PATIENT NAME: Adam Cabrera    MR#:  [id number]  DATE OF BIRTH:  1986-01-21  SUBJECTIVE:  No new issues other than some back pain Ambulating in and outside the room  VITALS:  Blood pressure 117/68, pulse 65, temperature 98 F (36.7 C), temperature source Oral, resp. rate 16, height 6\' 2"  (1.88 m), weight 120 kg, SpO2 100%.  PHYSICAL EXAMINATION:   GENERAL:  37 y.o.-year-old patient with no acute distress.  LUNGS: Normal breath sounds bilaterally, no wheezing CARDIOVASCULAR: S1, S2 normal. No murmur   ABDOMEN: Soft, nontender, nondistended. Surgical incision stable EXTREMITIES: No  edema b/l.    NEUROLOGIC: nonfocal  patient is alert and awake SKIN: tatto +  LABORATORY PANEL:  CBC Recent Labs  Lab 04/24/23 0229  WBC 3.2*  HGB 8.6*  HCT 28.0*  PLT 115*    Chemistries  Recent Labs  Lab 04/24/23 0229  NA 137  K 3.3*  CL 105  CO2 25  GLUCOSE 118*  BUN 9  CREATININE 0.52*  CALCIUM 8.2*  AST 15  ALT 12  ALKPHOS 60  BILITOT 1.1    Assessment and Plan  37 y.o. male with medical history significant for hypertension, scrotal cellulitis, history of IV drug use, presents with low back pain.  MRI of the lumbar spine showed Epidural extension with epidural phlegmon/abscess within the ventral epidural space extending from L1-2 through L3-4.  Blood culture positive for Staph aureus, patient treated with cefepime and vancomycin.  Patient has been seen by ID and neurosurgery, L spine washout performed 8/19.   Patient had a worsening back pain since 9/3, Patient was brought to the OR again 9/5. Had: 1. Transforaminal Lumbar Interbody Fusion L2-3.  2. Posterolateral arthrodesis L1 to L4 3. Posterior segmental instrumentation L1 to L4; 4.  Lumbar decompression with total facetectomy on the right at L2-3;  5.  Allograft placement in the 2 3 disc space for arthrodesis.   One of the wound cultures from 9/5 growing rare Staph  aureus.   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  On 9/5, patient had a another neurosurgical procedure which included transforaminal lumbar interbody fusion of L2/L3, posterolateral arthrodesis L1-L4, posterior segment instrumentation L1-L4, lumbar decompression with total fasciotomy on the right at L2-L3, allograft placement in the 2/3 disc space for arthrodesis.   --ID recommends 6 weeks total of IV antibiotics.  Since one of the cultures from 9/5 growing Staph aureus may have to be 6 weeks of antibiotics from that culture.  MSSA (methicillin susceptible Staphylococcus aureus) septicemia (HCC) --Present on admission with tachycardia, tachypnea and epidural abscess. --  Continue continuous IV cefazolin and rifampin--till 05/22/2023   Psoas abscess, left (HCC) --Continue abxs as above   Thrombocytopenia (HCC) -- Hepatitis C viral load positive, hepatitis A negative, hepatitis B surface antigen negative -plt stable at 115 (129)   BRBPR --Anusol suppository as needed--improved   Obesity (BMI 30-39.9) BMI 32.92   Hypokalemia Replaced   Iron deficiency anemia --Last hemoglobin 8.6.   --Combination of iron deficiency anemia and postoperative anemia.   --Patient on oral iron.  -- Likely hemorrhoids also contributing to anemia.  Anusol suppository as needed.    Headache As needed Fioricet.   Hyponatremia Resolved   Back pain Continue MS Contin 15 mg twice a day.  Continue as needed oxycodone.  --9/19-- discussed with patient regarding pain meds MS  Contin 15 mg BID and oxycodone 10 mg Q6 PRN. IV Dilaudid discontinued. Patient agreeable.  CODE STATUS: full DVT Prophylaxis :lovenox Level of care: Med-Surg Status is: Inpatient Remains inpatient appropriate because: Needs to complete IV abxs in the hospital    TOTAL TIME TAKING CARE OF THIS PATIENT: 25 minutes.  >50% time spent  on counselling and coordination of care  Note: This dictation was prepared with Dragon dictation along with smaller phrase technology. Any transcriptional errors that result from this process are unintentional.  Enedina Finner M.D    [facility]   CC: Primary care physician; Patient, No Pcp Per

## 2023-04-27 NOTE — Progress Notes (Signed)
Mobility Specialist - Progress Note   04/27/23 1122  Mobility  Activity Ambulated independently in hallway  Level of Assistance Independent  Assistive Device None  Distance Ambulated (ft) 1000 ft  Activity Response Tolerated well  Mobility Referral Yes  $Mobility charge 1 Mobility  Mobility Specialist Start Time (ACUTE ONLY) 1044  Mobility Specialist Stop Time (ACUTE ONLY) 1106  Mobility Specialist Time Calculation (min) (ACUTE ONLY) 22 min   Pt OOB on RA upon arrival. Pt defers TLSO brace. Pt ambulates to/from [facility] indep with no LOB noted. Pt returns to EOB with needs in reach.  Terrilyn Saver  Mobility Specialist  04/27/23 11:24 AM

## 2023-04-28 DIAGNOSIS — G061 Intraspinal abscess and granuloma: Secondary | ICD-10-CM | POA: Diagnosis not present

## 2023-04-28 NOTE — Progress Notes (Signed)
[facility]  - [place] at [facility]   PATIENT NAME: Adam Cabrera    MR#:  [id number]  DATE OF BIRTH:  12-May-1986  SUBJECTIVE:  No new issues other than some back pain   VITALS:  Blood pressure 117/69, pulse 71, temperature 97.7 F (36.5 C), resp. rate 18, height 6\' 2"  (1.88 m), weight 115.2 kg, SpO2 100%.  PHYSICAL EXAMINATION:   GENERAL:  37 y.o.-year-old patient with no acute distress.  NEUROLOGIC: nonfocal  patient is alert and awake SKIN: tattoo +  LABORATORY PANEL:  CBC Recent Labs  Lab 04/24/23 0229  WBC 3.2*  HGB 8.6*  HCT 28.0*  PLT 115*    Chemistries  Recent Labs  Lab 04/24/23 0229  NA 137  K 3.3*  CL 105  CO2 25  GLUCOSE 118*  BUN 9  CREATININE 0.52*  CALCIUM 8.2*  AST 15  ALT 12  ALKPHOS 60  BILITOT 1.1    Assessment and Plan  38 y.o. male with medical history significant for hypertension, scrotal cellulitis, history of IV drug use, presents with low back pain.  MRI of the lumbar spine showed Epidural extension with epidural phlegmon/abscess within the ventral epidural space extending from L1-2 through L3-4.  Blood culture positive for Staph aureus, patient treated with cefepime and vancomycin.  Patient has been seen by ID and neurosurgery, L spine washout performed 8/19.   Patient had a worsening back pain since 9/3, Patient was brought to the OR again 9/5. Had: 1. Transforaminal Lumbar Interbody Fusion L2-3.  2. Posterolateral arthrodesis L1 to L4 3. Posterior segmental instrumentation L1 to L4; 4.  Lumbar decompression with total facetectomy on the right at L2-3;  5.  Allograft placement in the 2 3 disc space for arthrodesis.   One of the wound cultures from 9/5 growing rare Staph aureus.   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  On 9/5, patient had a another neurosurgical procedure which included transforaminal lumbar  interbody fusion of L2/L3, posterolateral arthrodesis L1-L4, posterior segment instrumentation L1-L4, lumbar decompression with total fasciotomy on the right at L2-L3, allograft placement in the 2/3 disc space for arthrodesis.   --ID recommends 6 weeks total of IV antibiotics.  Since one of the cultures from 9/5 growing Staph aureus may have to be 6 weeks of antibiotics from that culture.  MSSA (methicillin susceptible Staphylococcus aureus) septicemia (HCC) --Present on admission with tachycardia, tachypnea and epidural abscess. --  Continue continuous IV cefazolin and rifampin--till 05/22/2023   Psoas abscess, left (HCC) --Continue abxs as above   Thrombocytopenia (HCC) -- Hepatitis C viral load positive, hepatitis A negative, hepatitis B surface antigen negative -plt stable at 115 (129)   BRBPR --Anusol suppository as needed--improved   Obesity (BMI 30-39.9) BMI 32.92   Hypokalemia Replaced   Iron deficiency anemia --Last hemoglobin 8.6.   --Combination of iron deficiency anemia and postoperative anemia.   --Patient on oral iron.  -- Likely hemorrhoids also contributing to anemia.  Anusol suppository as needed.    Headache As needed Fioricet.   Hyponatremia Resolved   Back pain Continue MS Contin 15 mg twice a day.  Continue as needed oxycodone.  --9/19-- discussed with patient regarding pain meds MS Contin 15 mg BID and oxycodone 10 mg Q6 PRN. IV Dilaudid discontinued. Patient agreeable.  CODE STATUS: full DVT Prophylaxis :lovenox Level of care: Med-Surg Status is: Inpatient Remains inpatient appropriate because: Needs to complete IV abxs  in the hospital    TOTAL TIME TAKING CARE OF THIS PATIENT: 25 minutes.  >50% time spent on counselling and coordination of care  Note: This dictation was prepared with Dragon dictation along with smaller phrase technology. Any transcriptional errors that result from this process are unintentional.  Enedina Finner M.D    [facility]   CC: Primary care physician; Patient, No Pcp Per

## 2023-04-28 NOTE — Plan of Care (Signed)

## 2023-04-29 DIAGNOSIS — G061 Intraspinal abscess and granuloma: Secondary | ICD-10-CM | POA: Diagnosis not present

## 2023-04-29 DIAGNOSIS — R7881 Bacteremia: Secondary | ICD-10-CM | POA: Diagnosis not present

## 2023-04-29 DIAGNOSIS — M4646 Discitis, unspecified, lumbar region: Secondary | ICD-10-CM | POA: Diagnosis not present

## 2023-04-29 DIAGNOSIS — K6812 Psoas muscle abscess: Secondary | ICD-10-CM | POA: Diagnosis not present

## 2023-04-29 NOTE — Progress Notes (Signed)
[facility]  - [place] at [facility]   PATIENT NAME: Adam Cabrera    MR#:  [id number]  DATE OF BIRTH:  07/11/1986  SUBJECTIVE:  No new issues other than some back pain   VITALS:  Blood pressure 100/60, pulse 74, temperature 97.6 F (36.4 C), resp. rate 14, height 6\' 2"  (1.88 m), weight 114 kg, SpO2 100%.  PHYSICAL EXAMINATION:   GENERAL:  37 y.o.-year-old patient with no acute distress.  NEUROLOGIC: nonfocal  patient is alert and awake SKIN: tattoo +  LABORATORY PANEL:  CBC Recent Labs  Lab 04/24/23 0229  WBC 3.2*  HGB 8.6*  HCT 28.0*  PLT 115*    Chemistries  Recent Labs  Lab 04/24/23 0229  NA 137  K 3.3*  CL 105  CO2 25  GLUCOSE 118*  BUN 9  CREATININE 0.52*  CALCIUM 8.2*  AST 15  ALT 12  ALKPHOS 60  BILITOT 1.1    Assessment and Plan  37 y.o. male with medical history significant for hypertension, scrotal cellulitis, history of IV drug use, presents with low back pain.  MRI of the lumbar spine showed Epidural extension with epidural phlegmon/abscess within the ventral epidural space extending from L1-2 through L3-4.  Blood culture positive for Staph aureus, patient treated with cefepime and vancomycin.  Patient has been seen by ID and neurosurgery, L spine washout performed 8/19.   Patient had a worsening back pain since 9/3, Patient was brought to the OR again 9/5. Had: 1. Transforaminal Lumbar Interbody Fusion L2-3.  2. Posterolateral arthrodesis L1 to L4 3. Posterior segmental instrumentation L1 to L4; 4.  Lumbar decompression with total facetectomy on the right at L2-3;  5.  Allograft placement in the 2 3 disc space for arthrodesis.   One of the wound cultures from 9/5 growing rare Staph aureus.   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  On 9/5, patient had a another neurosurgical procedure which included transforaminal lumbar  interbody fusion of L2/L3, posterolateral arthrodesis L1-L4, posterior segment instrumentation L1-L4, lumbar decompression with total fasciotomy on the right at L2-L3, allograft placement in the 2/3 disc space for arthrodesis.   --ID recommends 6 weeks total of IV antibiotics.  Since one of the cultures from 9/5 growing Staph aureus may have to be 6 weeks of antibiotics from that culture.  MSSA (methicillin susceptible Staphylococcus aureus) septicemia (HCC) --Present on admission with tachycardia, tachypnea and epidural abscess. --  Continue continuous IV cefazolin and rifampin--till 05/22/2023   Psoas abscess, left (HCC) --Continue abxs as above   Thrombocytopenia (HCC) -- Hepatitis C viral load positive, hepatitis A negative, hepatitis B surface antigen negative -plt stable at 115 (129)   BRBPR --Anusol suppository as needed--improved   Obesity (BMI 30-39.9) BMI 32.92   Hypokalemia Replaced   Iron deficiency anemia --Last hemoglobin 8.6.   --Combination of iron deficiency anemia and postoperative anemia.   --Patient on oral iron.  -- Likely hemorrhoids also contributing to anemia.  Anusol suppository as needed.    Headache As needed Fioricet.    Back pain Continue MS Contin 15 mg twice a day.  Continue as needed oxycodone.  --9/19-- discussed with patient regarding pain meds MS Contin 15 mg BID and oxycodone 10 mg Q6 PRN. IV Dilaudid discontinued. Patient agreeable.  CODE STATUS: full DVT Prophylaxis :lovenox Level of care: Med-Surg Status is: Inpatient Remains inpatient appropriate because: Needs to complete IV abxs in the hospital  TOTAL TIME TAKING CARE OF THIS PATIENT: 25 minutes.  >50% time spent on counselling and coordination of care  Note: This dictation was prepared with Dragon dictation along with smaller phrase technology. Any transcriptional errors that result from this process are unintentional.  Enedina Finner M.D    [facility]    CC: Primary care physician; Patient, No Pcp Per

## 2023-04-29 NOTE — Plan of Care (Signed)

## 2023-04-29 NOTE — Plan of Care (Signed)
  Problem: Education: Goal: Knowledge of General Education information will improve Description: Including pain rating scale, medication(s)/side effects and non-pharmacologic comfort measures Outcome: Progressing   Problem: Health Behavior/Discharge Planning: Goal: Ability to manage health-related needs will improve Outcome: Progressing   Problem: Activity: Goal: Risk for activity intolerance will decrease Outcome: Progressing   Problem: Nutrition: Goal: Adequate nutrition will be maintained Outcome: Progressing   Problem: Coping: Goal: Level of anxiety will decrease Outcome: Progressing   Problem: Activity: Goal: Risk for activity intolerance will decrease Outcome: Progressing

## 2023-04-29 NOTE — Progress Notes (Signed)
Occupational Therapy Treatment Patient Details Name: Adam Cabrera MRN: [id number] DOB: Mar 02, 1986 Today's Date: 04/29/2023   History of present illness 37 y.o. male with medical history significant for hypertension, scrotal cellulitis, history of IV drug use, presents with low back pain.  MRI of the lumbar spine showed Epidural extension with epidural phlegmon/abscess within the ventral epidural space extending from L1-2 through L3-4 and s/p lumbar laminectomy; Pt is now s/p L1-4 PSF with right L3 facetectomy 04/10/23.   OT comments  Pt seen for OT treatment on this date. Upon arrival to room pt asleep in bed, agreeable to tx. Pt reporting continued progression with ADL participation and decreased pain levels overall (currently 6/10). Reviewed use of TLSO and current orders for donning during increased mobility. He verbalizes understanding and recalls back precautions, able to demo log rolling technique to sit EOB. LB dressing education and demo with AE, pt donning socks and shorts with 1 vc. Pt verbalizes that he would like to eventually shower, and OT educates on need for clearance from MD. Education on toilet transfers while adhering to back precautions - pt has been up to bathroom IND in room using regular low toilet with grab bars for support. OT places bariatric BSC over toilet to ensure safety, and ease of use while transferring using proper body mechanics with current precautions. Pt would benefit from further ADL sessions including a shower to advance functional gains when appropriate. Pt making good progress toward goals, will continue to follow POC. Discharge recommendation remains appropriate.        If plan is discharge home, recommend the following:  A little help with walking and/or transfers;Assist for transportation;Assistance with cooking/housework;Help with stairs or ramp for entrance;A little help with bathing/dressing/bathroom   Equipment Recommendations  BSC/3in1;Tub/shower seat     Recommendations for Other Services Other (comment)    Precautions / Restrictions Precautions Precautions: Back;Fall Restrictions Weight Bearing Restrictions: No       Mobility Bed Mobility Overal bed mobility: Modified Independent Bed Mobility: Sit to Supine     Supine to sit: Supervision, HOB elevated     General bed mobility comments: no cues for log rolling needed    Transfers Overall transfer level: Modified independent Equipment used: None Transfers: Sit to/from Stand Sit to Stand: Modified independent (Device/Increase time)                 Balance   Sitting-balance support: Feet supported Sitting balance-Leahy Scale: Good                                     ADL either performed or assessed with clinical judgement   ADL Overall ADL's : Needs assistance/impaired                     Lower Body Dressing: Adhering to back precautions;With adaptive equipment;Modified independent;Bed level Lower Body Dressing Details (indicate cue type and reason): LB dressing with sock aide/reacher, pt performs with 1 vc to adhrere to back precautions   Toilet Transfer Details (indicate cue type and reason): Pt has been amb IND to bathroom in room - OT discussed recommendation for bari BSC to be placed over toilet as pt is tall, and was using reg height commode in room with GB for support. Bari BSC placed in room.           General ADL Comments: Pt sits EOB to don socks using  sock aide, dons shorts with sit to stand.      Cognition Arousal: Alert Behavior During Therapy: WFL for tasks assessed/performed Overall Cognitive Status: Within Functional Limits for tasks assessed                                          Exercises Other Exercises Other Exercises: review of ADL and IADLs with back precautions, discussed toilet transfers            Pertinent Vitals/ Pain       Pain Assessment Pain Assessment: No/denies  pain Pain Score: 6  Pain Location: back Pain Descriptors / Indicators: Discomfort, Sore Pain Intervention(s): Limited activity within patient's tolerance, Monitored during session   Frequency  Min 1X/week        Progress Toward Goals  OT Goals(current goals can now be found in the care plan section)  Progress towards OT goals: Progressing toward goals  Acute Rehab OT Goals OT Goal Formulation: With patient Time For Goal Achievement: 05/07/23 Potential to Achieve Goals: Good  Plan         AM-PAC OT "6 Clicks" Daily Activity     Outcome Measure   Help from another person eating meals?: None Help from another person taking care of personal grooming?: None Help from another person toileting, which includes using toliet, bedpan, or urinal?: A Little Help from another person bathing (including washing, rinsing, drying)?: A Little Help from another person to put on and taking off regular upper body clothing?: None Help from another person to put on and taking off regular lower body clothing?: A Little 6 Click Score: 21    End of Session    OT Visit Diagnosis: Unsteadiness on feet (R26.81);Muscle weakness (generalized) (M62.81)   Activity Tolerance Patient tolerated treatment well   Patient Left with call bell/phone within reach;in bed;with nursing/sitter in room   Nurse Communication Other (comment)        Time: 4270-6237 OT Time Calculation (min): 28 min  Charges: OT General Charges $OT Visit: 1 Visit OT Treatments $Self Care/Home Management : 23-37 mins  Shelma Eiben L. Destiny Hagin, OTR/L  04/29/23, 4:25 PM

## 2023-04-30 DIAGNOSIS — A4101 Sepsis due to Methicillin susceptible Staphylococcus aureus: Secondary | ICD-10-CM | POA: Diagnosis not present

## 2023-04-30 DIAGNOSIS — M8608 Acute hematogenous osteomyelitis, other sites: Secondary | ICD-10-CM | POA: Diagnosis not present

## 2023-04-30 DIAGNOSIS — B9561 Methicillin susceptible Staphylococcus aureus infection as the cause of diseases classified elsewhere: Secondary | ICD-10-CM | POA: Diagnosis not present

## 2023-04-30 DIAGNOSIS — G061 Intraspinal abscess and granuloma: Secondary | ICD-10-CM | POA: Diagnosis not present

## 2023-04-30 LAB — CBC WITH DIFFERENTIAL/PLATELET
Abs Immature Granulocytes: 0.03 10*3/uL (ref 0.00–0.07)
Basophils Absolute: 0 10*3/uL (ref 0.0–0.1)
Basophils Relative: 1 %
Eosinophils Absolute: 0.1 10*3/uL (ref 0.0–0.5)
Eosinophils Relative: 3 %
HCT: 31.9 % — ABNORMAL LOW (ref 39.0–52.0)
Hemoglobin: 9.6 g/dL — ABNORMAL LOW (ref 13.0–17.0)
Immature Granulocytes: 1 %
Lymphocytes Relative: 33 %
Lymphs Abs: 1.1 10*3/uL (ref 0.7–4.0)
MCH: 28.6 pg (ref 26.0–34.0)
MCHC: 30.1 g/dL (ref 30.0–36.0)
MCV: 94.9 fL (ref 80.0–100.0)
Monocytes Absolute: 0.3 10*3/uL (ref 0.1–1.0)
Monocytes Relative: 8 %
Neutro Abs: 1.8 10*3/uL (ref 1.7–7.7)
Neutrophils Relative %: 54 %
Platelets: 101 10*3/uL — ABNORMAL LOW (ref 150–400)
RBC: 3.36 MIL/uL — ABNORMAL LOW (ref 4.22–5.81)
RDW: 18.1 % — ABNORMAL HIGH (ref 11.5–15.5)
WBC: 3.2 10*3/uL — ABNORMAL LOW (ref 4.0–10.5)
nRBC: 0 % (ref 0.0–0.2)

## 2023-04-30 LAB — COMPREHENSIVE METABOLIC PANEL
ALT: 11 U/L (ref 0–44)
AST: 18 U/L (ref 15–41)
Albumin: 3.4 g/dL — ABNORMAL LOW (ref 3.5–5.0)
Alkaline Phosphatase: 56 U/L (ref 38–126)
Anion gap: 7 (ref 5–15)
BUN: 9 mg/dL (ref 6–20)
CO2: 25 mmol/L (ref 22–32)
Calcium: 8.3 mg/dL — ABNORMAL LOW (ref 8.9–10.3)
Chloride: 105 mmol/L (ref 98–111)
Creatinine, Ser: 0.64 mg/dL (ref 0.61–1.24)
GFR, Estimated: >60 mL/min (ref >60)
Glucose, Bld: 96 mg/dL (ref 70–99)
Potassium: 3.5 mmol/L (ref 3.5–5.1)
Sodium: 137 mmol/L (ref 135–145)
Total Bilirubin: 0.8 mg/dL (ref 0.3–1.2)
Total Protein: 6.8 g/dL (ref 6.5–8.1)

## 2023-04-30 LAB — C-REACTIVE PROTEIN: CRP: 0.6 mg/dL (ref <1.0)

## 2023-04-30 LAB — SEDIMENTATION RATE: Sed Rate: 30 mm/hr — ABNORMAL HIGH (ref 0–15)

## 2023-04-30 NOTE — Progress Notes (Signed)
Mobility Specialist - Progress Note   04/30/23 1033  Mobility  Activity Ambulated independently in hallway  Level of Assistance Independent  Assistive Device None  Distance Ambulated (ft) 800 ft  Activity Response Tolerated well  $Mobility charge 1 Mobility  Mobility Specialist Start Time (ACUTE ONLY) 1012  Mobility Specialist Stop Time (ACUTE ONLY) 1025  Mobility Specialist Time Calculation (min) (ACUTE ONLY) 13 min   Pt standing in the hallway with Pt advocate, no brace or AD upon arrival. Pt amb five laps around the NS indep, some SOB noted. Pt returned to the room, left seated in the recliner with alarm set and needs within reach.   Zetta Bills Mobility Specialist 04/30/23 10:37 AM

## 2023-04-30 NOTE — TOC Progression Note (Signed)
Transition of Care [facility]) - Progression Note    Patient Details  Name: Adam Cabrera MRN: [id number] Date of Birth: Feb 13, 1986  [facility] CM/SW Contact  Marlowe Sax, RN Phone Number: 04/30/2023, 9:43 AM  Clinical Narrative:     TOC continues to follow this patient, he will remain in the hospital until 10/17 for IV ABX No current TOC needs  Expected Discharge Plan: Home/Self Care Barriers to Discharge: Continued Medical Work up  Expected Discharge Plan and Services   Discharge Planning Services: CM Consult   Living arrangements for the past 2 months: Single Family Home                   DME Agency: NA       HH Arranged: NA           Social Determinants of Health (SDOH) Interventions SDOH Screenings   Food Insecurity: Patient Declined (04/17/2023)  Housing: Patient Declined (04/17/2023)  Transportation Needs: Patient Declined (04/17/2023)  Utilities: Patient Declined (04/17/2023)  Tobacco Use: High Risk (04/10/2023)    Readmission Risk Interventions     No data to display

## 2023-04-30 NOTE — Progress Notes (Signed)
[facility]  - [place] at [facility]   PATIENT NAME: Adam Cabrera    MR#:  [id number]  DATE OF BIRTH:  [birth year]  SUBJECTIVE:  No new issues other than some back pain VITALS:  Blood pressure 110/70, pulse 60, temperature 97.7 F (36.5 C), resp. rate 18, height 6\' 2"  (1.88 m), weight 114.2 kg, SpO2 98%.  PHYSICAL EXAMINATION:   GENERAL:  37 y.o.-year-old patient with no acute distress.  NEUROLOGIC: nonfocal  patient is alert and awake SKIN: tattoo + Heart regular rate and rhythm LABORATORY PANEL:  CBC Recent Labs  Lab 04/30/23 0618  WBC 3.2*  HGB 9.6*  HCT 31.9*  PLT 101*    Chemistries  Recent Labs  Lab 04/30/23 0618  NA 137  K 3.5  CL 105  CO2 25  GLUCOSE 96  BUN 9  CREATININE 0.64  CALCIUM 8.3*  AST 18  ALT 11  ALKPHOS 56  BILITOT 0.8    Assessment and Plan  37 y.o. male with medical history significant for hypertension, scrotal cellulitis, history of IV drug use, presents with low back pain.  MRI of the lumbar spine showed Epidural extension with epidural phlegmon/abscess within the ventral epidural space extending from L1-2 through L3-4.  Blood culture positive for Staph aureus, patient treated with cefepime and vancomycin.  Patient has been seen by ID and neurosurgery, L spine washout performed 8/19.   Patient had a worsening back pain since 9/3, Patient was brought to the OR again 9/5. Had: 1. Transforaminal Lumbar Interbody Fusion L2-3.  2. Posterolateral arthrodesis L1 to L4 3. Posterior segmental instrumentation L1 to L4; 4.  Lumbar decompression with total facetectomy on the right at L2-3;  5.  Allograft placement in the 2 3 disc space for arthrodesis.   One of the wound cultures from 9/5 growing rare Staph aureus.   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  On 9/5, patient had a another neurosurgical procedure which included  transforaminal lumbar interbody fusion of L2/L3, posterolateral arthrodesis L1-L4, posterior segment instrumentation L1-L4, lumbar decompression with total fasciotomy on the right at L2-L3, allograft placement in the 2/3 disc space for arthrodesis.   --ID recommends 6 weeks total of IV antibiotics.  Since one of the cultures from 9/5 growing Staph aureus may have to be 6 weeks of antibiotics from that culture.  Last dose will be on 05/22/2023  MSSA (methicillin susceptible Staphylococcus aureus) septicemia (HCC) --Present on admission with tachycardia, tachypnea and epidural abscess. --  Continue continuous IV cefazolin and rifampin--till 05/22/2023   Psoas abscess, left (HCC) --Continue abxs as above   Thrombocytopenia (HCC) -- Hepatitis C viral load positive, hepatitis A negative, hepatitis B surface antigen negative -plt stable at 115 (129)   BRBPR --Anusol suppository as needed--improved   Obesity (BMI 30-39.9) BMI 32.92   Hypokalemia Replaced   Iron deficiency anemia --Last hemoglobin 8.6.   --Combination of iron deficiency anemia and postoperative anemia.   --Patient on oral iron.  -- Likely hemorrhoids also contributing to anemia.  Anusol suppository as needed.    Headache As needed Fioricet.    Back pain Continue MS Contin 15 mg twice a day.  Continue as needed oxycodone.  --9/19-- discussed with patient regarding pain meds MS Contin 15 mg BID and oxycodone 10 mg Q6 PRN. IV Dilaudid discontinued. Patient agreeable. Pain manageable for now  CODE STATUS: full code DVT Prophylaxis :lovenox Level of care: Med-Surg Status  is: Inpatient Remains inpatient appropriate because: Needs to complete IV abxs in the hospital until 05/22/2023    TOTAL TIME TAKING CARE OF THIS PATIENT: 25 minutes.  >50% time spent on counselling and coordination of care  Note: This dictation was prepared with Dragon dictation along with smaller phrase technology. Any transcriptional errors that  result from this process are unintentional.  Delfino Lovett M.D    [facility]   CC: Primary care physician; Patient, No Pcp Per

## 2023-04-30 NOTE — Plan of Care (Signed)
  Problem: Education: Goal: Knowledge of General Education information will improve Description: Including pain rating scale, medication(s)/side effects and non-pharmacologic comfort measures Outcome: Progressing   Problem: Clinical Measurements: Goal: Ability to maintain clinical measurements within normal limits will improve Outcome: Progressing   Problem: Activity: Goal: Risk for activity intolerance will decrease Outcome: Progressing   Problem: Nutrition: Goal: Adequate nutrition will be maintained Outcome: Progressing   Problem: Coping: Goal: Level of anxiety will decrease Outcome: Progressing   Problem: Elimination: Goal: Will not experience complications related to bowel motility Outcome: Progressing   Problem: Education: Goal: Knowledge of General Education information will improve Description: Including pain rating scale, medication(s)/side effects and non-pharmacologic comfort measures Outcome: Progressing   Problem: Activity: Goal: Risk for activity intolerance will decrease Outcome: Progressing

## 2023-04-30 NOTE — Progress Notes (Signed)
Occupational Therapy Treatment & Discharge Patient Details Name: Adam Cabrera MRN: [id number] DOB: January 20, 1986 Today's Date: 04/30/2023   History of present illness 37 y.o. male with medical history significant for hypertension, scrotal cellulitis, history of IV drug use, presents with low back pain.  MRI of the lumbar spine showed Epidural extension with epidural phlegmon/abscess within the ventral epidural space extending from L1-2 through L3-4 and s/p lumbar laminectomy; Pt is now s/p L1-4 PSF with right L3 facetectomy 04/10/23.   OT comments  Pt seen for OT tx this date. Pt has been educated in back precautions with handout provided, self care skills, bed mobility and functional transfer training, AE/DME for bathing, dressing, and toileting needs, and home/routines modifications and falls prevention strategies to maximize safety and functional independence while minimizing falls risk and maintaining precautions. Chart review indicates pt has been walking in hallway without TLSO - reviewed current orders for brace. Pt verbalized understanding of all education/training provided. Handout provided to support recall and carry over of learned precautions/techniques for bed mobility, functional transfers, and self care skills. No additional skilled OT needs at this time. Pt has met all goals, has been IND in room for mobility/ADLs and continues to work with mobility team on unit. If further skilled OT needs arise, please re-consult. Pt discharged from OT services at this time.       If plan is discharge home, recommend the following:  A little help with walking and/or transfers;Assist for transportation;Assistance with cooking/housework;Help with stairs or ramp for entrance;A little help with bathing/dressing/bathroom   Equipment Recommendations  BSC/3in1;Tub/shower seat    Recommendations for Other Services Other (comment)    Precautions / Restrictions Precautions Precautions:  Back Restrictions Weight Bearing Restrictions: No        ADL either performed or assessed with clinical judgement   ADL                                         General ADL Comments: Pt reporting 8/10 pain; session focuses on education and completion of goals      Cognition Arousal: Alert Behavior During Therapy: WFL for tasks assessed/performed Overall Cognitive Status: Within Functional Limits for tasks assessed                                 General Comments: pt is A and O x 4. cooperative throughout        Exercises Other Exercises Other Exercises: Education session with pt regarding d/c from OT, progression of ADL/IADL tasks, AE training for full ADL routine and back precautions/wear of TLSO.            Pertinent Vitals/ Pain       Pain Assessment Pain Assessment: 0-10 Pain Score: 8  Pain Location: back Pain Descriptors / Indicators: Discomfort, Sore Pain Intervention(s): Limited activity within patient's tolerance (Pt states he had meds earlier)   Frequency  Min 1X/week        Progress Toward Goals  OT Goals(current goals can now be found in the care plan section)  Progress towards OT goals: Goals met/education completed, patient discharged from OT  Acute Rehab OT Goals OT Goal Formulation: With patient Time For Goal Achievement: 05/07/23 Potential to Achieve Goals: Good  Plan         AM-PAC OT "6 Clicks" Daily Activity  Outcome Measure   Help from another person eating meals?: None Help from another person taking care of personal grooming?: None Help from another person toileting, which includes using toliet, bedpan, or urinal?: None Help from another person bathing (including washing, rinsing, drying)?: None Help from another person to put on and taking off regular upper body clothing?: None Help from another person to put on and taking off regular lower body clothing?: None 6 Click Score: 24    End of  Session Equipment Utilized During Treatment: Other (comment)  OT Visit Diagnosis: Unsteadiness on feet (R26.81);Muscle weakness (generalized) (M62.81)   Activity Tolerance Patient tolerated treatment well   Patient Left in bed;with call bell/phone within reach   Nurse Communication Mobility status        Time: 4696-2952 OT Time Calculation (min): 11 min  Charges: OT Treatments $Self Care/Home Management : 8-22 mins  Alga Southall L. Giara Mcgaughey, OTR/L  04/30/23, 3:59 PM

## 2023-04-30 NOTE — Plan of Care (Signed)
  Problem: Education: Goal: Knowledge of General Education information will improve Description: Including pain rating scale, medication(s)/side effects and non-pharmacologic comfort measures 04/30/2023 1737 by Delila Spence, RN Outcome: Progressing 04/30/2023 1624 by Delila Spence, RN Outcome: Progressing   Problem: Health Behavior/Discharge Planning: Goal: Ability to manage health-related needs will improve Outcome: Progressing   Problem: Clinical Measurements: Goal: Ability to maintain clinical measurements within normal limits will improve 04/30/2023 1737 by Delila Spence, RN Outcome: Progressing 04/30/2023 1624 by Delila Spence, RN Outcome: Progressing Goal: Will remain free from infection Outcome: Progressing Goal: Diagnostic test results will improve Outcome: Progressing Goal: Respiratory complications will improve Outcome: Progressing Goal: Cardiovascular complication will be avoided Outcome: Progressing   Problem: Activity: Goal: Risk for activity intolerance will decrease 04/30/2023 1737 by Delila Spence, RN Outcome: Progressing 04/30/2023 1624 by Delila Spence, RN Outcome: Progressing   Problem: Nutrition: Goal: Adequate nutrition will be maintained 04/30/2023 1737 by Delila Spence, RN Outcome: Progressing 04/30/2023 1624 by Delila Spence, RN Outcome: Progressing   Problem: Coping: Goal: Level of anxiety will decrease 04/30/2023 1737 by Delila Spence, RN Outcome: Progressing 04/30/2023 1624 by Delila Spence, RN Outcome: Progressing   Problem: Elimination: Goal: Will not experience complications related to bowel motility 04/30/2023 1737 by Delila Spence, RN Outcome: Progressing 04/30/2023 1624 by Delila Spence, RN Outcome: Progressing Goal: Will not experience complications related to urinary retention Outcome: Progressing   Problem: Pain Managment: Goal: General experience of comfort will improve 04/30/2023 1737  by Delila Spence, RN Outcome: Progressing 04/30/2023 1624 by Delila Spence, RN Outcome: Progressing   Problem: Skin Integrity: Goal: Risk for impaired skin integrity will decrease Outcome: Progressing   Problem: Education: Goal: Knowledge of General Education information will improve Description: Including pain rating scale, medication(s)/side effects and non-pharmacologic comfort measures 04/30/2023 1737 by Delila Spence, RN Outcome: Progressing 04/30/2023 1624 by Delila Spence, RN Outcome: Progressing   Problem: Health Behavior/Discharge Planning: Goal: Ability to manage health-related needs will improve Outcome: Progressing   Problem: Activity: Goal: Risk for activity intolerance will decrease 04/30/2023 1737 by Delila Spence, RN Outcome: Progressing 04/30/2023 1624 by Delila Spence, RN Outcome: Progressing

## 2023-05-01 DIAGNOSIS — B9561 Methicillin susceptible Staphylococcus aureus infection as the cause of diseases classified elsewhere: Secondary | ICD-10-CM | POA: Diagnosis not present

## 2023-05-01 DIAGNOSIS — G061 Intraspinal abscess and granuloma: Secondary | ICD-10-CM | POA: Diagnosis not present

## 2023-05-01 DIAGNOSIS — M8608 Acute hematogenous osteomyelitis, other sites: Secondary | ICD-10-CM | POA: Diagnosis not present

## 2023-05-01 DIAGNOSIS — R7881 Bacteremia: Secondary | ICD-10-CM | POA: Diagnosis not present

## 2023-05-01 NOTE — Plan of Care (Signed)
  Problem: Education: Goal: Knowledge of General Education information will improve Description: Including pain rating scale, medication(s)/side effects and non-pharmacologic comfort measures Outcome: Progressing   Problem: Pain Managment: Goal: General experience of comfort will improve Outcome: Progressing   Problem: Skin Integrity: Goal: Risk for impaired skin integrity will decrease Outcome: Progressing   

## 2023-05-01 NOTE — Progress Notes (Signed)
[facility]  - [place] at [facility]   PATIENT NAME: Adam Cabrera    MR#:  [id number]  DATE OF BIRTH:  June 27, 1986  SUBJECTIVE:  No new issues  VITALS:  Blood pressure 119/71, pulse 76, temperature 98 F (36.7 C), resp. rate 19, height 6\' 2"  (1.88 m), weight 114.7 kg, SpO2 99%.  PHYSICAL EXAMINATION:   GENERAL:  37 y.o.-year-old patient with no acute distress.  NEUROLOGIC: nonfocal  patient is alert and awake SKIN: tattoo + Heart regular rate and rhythm LABORATORY PANEL:  CBC Recent Labs  Lab 04/30/23 0618  WBC 3.2*  HGB 9.6*  HCT 31.9*  PLT 101*    Chemistries  Recent Labs  Lab 04/30/23 0618  NA 137  K 3.5  CL 105  CO2 25  GLUCOSE 96  BUN 9  CREATININE 0.64  CALCIUM 8.3*  AST 18  ALT 11  ALKPHOS 56  BILITOT 0.8    Assessment and Plan  37 y.o. male with medical history significant for hypertension, scrotal cellulitis, history of IV drug use, presents with low back pain.  MRI of the lumbar spine showed Epidural extension with epidural phlegmon/abscess within the ventral epidural space extending from L1-2 through L3-4.  Blood culture positive for Staph aureus, patient treated with cefepime and vancomycin.  Patient has been seen by ID and neurosurgery, L spine washout performed 8/19.   Patient had a worsening back pain since 9/3, Patient was brought to the OR again 9/5. Had: 1. Transforaminal Lumbar Interbody Fusion L2-3.  2. Posterolateral arthrodesis L1 to L4 3. Posterior segmental instrumentation L1 to L4; 4.  Lumbar decompression with total facetectomy on the right at L2-3;  5.  Allograft placement in the 2 3 disc space for arthrodesis.   One of the wound cultures from 9/5 growing rare Staph aureus.   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  On 9/5, patient had a another neurosurgical procedure which included transforaminal lumbar interbody  fusion of L2/L3, posterolateral arthrodesis L1-L4, posterior segment instrumentation L1-L4, lumbar decompression with total fasciotomy on the right at L2-L3, allograft placement in the 2/3 disc space for arthrodesis.   --ID recommends 6 weeks total of IV antibiotics.  Since one of the cultures from 9/5 growing Staph aureus may have to be 6 weeks of antibiotics from that culture.  Last dose will be on 05/22/2023  MSSA (methicillin susceptible Staphylococcus aureus) septicemia (HCC) --Present on admission with tachycardia, tachypnea and epidural abscess. --  Continue continuous IV cefazolin and rifampin--till 05/22/2023   Psoas abscess, left (HCC) --Continue abxs as above   Thrombocytopenia (HCC) -- Hepatitis C viral load positive, hepatitis A negative, hepatitis B surface antigen negative -plt trending down.  129>101.  Will hold Lovenox for now   BRBPR --Anusol suppository as needed--improved   Obesity (BMI 30-39.9) BMI 32.92   Hypokalemia Replaced   Iron deficiency anemia --Last hemoglobin 9.6.   --Combination of iron deficiency anemia and postoperative anemia.   --Patient on oral iron.  -- Likely hemorrhoids also contributing to anemia.  Anusol suppository as needed.    Headache As needed Fioricet.    Back pain Continue MS Contin 15 mg twice a day.  Continue as needed oxycodone.  --9/19-- discussed with patient regarding pain meds MS Contin 15 mg BID and oxycodone 10 mg Q6 PRN. IV Dilaudid discontinued. Patient agreeable. Pain manageable for now  CODE STATUS: full code DVT Prophylaxis :lovenox Level of care:  Med-Surg Status is: Inpatient Remains inpatient appropriate because: Needs to complete IV abxs in the hospital until 05/22/2023    TOTAL TIME TAKING CARE OF THIS PATIENT: 15 minutes.  >50% time spent on counselling and coordination of care  Note: This dictation was prepared with Dragon dictation along with smaller phrase technology. Any transcriptional errors that  result from this process are unintentional.  Delfino Lovett M.D    [facility]   CC: Primary care physician; Patient, No Pcp Per

## 2023-05-01 NOTE — Plan of Care (Signed)

## 2023-05-02 DIAGNOSIS — M8608 Acute hematogenous osteomyelitis, other sites: Secondary | ICD-10-CM | POA: Diagnosis not present

## 2023-05-02 DIAGNOSIS — A4101 Sepsis due to Methicillin susceptible Staphylococcus aureus: Secondary | ICD-10-CM | POA: Diagnosis not present

## 2023-05-02 DIAGNOSIS — D696 Thrombocytopenia, unspecified: Secondary | ICD-10-CM | POA: Diagnosis not present

## 2023-05-02 DIAGNOSIS — G061 Intraspinal abscess and granuloma: Secondary | ICD-10-CM | POA: Diagnosis not present

## 2023-05-02 LAB — CBC
HCT: 33 % — ABNORMAL LOW (ref 39.0–52.0)
Hemoglobin: 10.1 g/dL — ABNORMAL LOW (ref 13.0–17.0)
MCH: 28.9 pg (ref 26.0–34.0)
MCHC: 30.6 g/dL (ref 30.0–36.0)
MCV: 94.3 fL (ref 80.0–100.0)
Platelets: 107 10*3/uL — ABNORMAL LOW (ref 150–400)
RBC: 3.5 MIL/uL — ABNORMAL LOW (ref 4.22–5.81)
RDW: 17.5 % — ABNORMAL HIGH (ref 11.5–15.5)
WBC: 3.8 10*3/uL — ABNORMAL LOW (ref 4.0–10.5)
nRBC: 0 % (ref 0.0–0.2)

## 2023-05-02 MED ORDER — SODIUM CHLORIDE 0.9% FLUSH: 10.0000 mL | INTRAVENOUS | Status: DC | PRN

## 2023-05-02 MED ORDER — SODIUM CHLORIDE 0.9% FLUSH
10.0000 mL | Freq: Two times a day (BID) | INTRAVENOUS | Status: DC
  Administered 2023-05-02 – 2023-05-13 (×22): 10 mL

## 2023-05-02 NOTE — Progress Notes (Signed)
A midline has been ordered for this patient. The [facility] RN has reviewed the patient's medical record including any arm restrictions, current creatinine clearance, length IV therapy is needed, and infusions needed/ordered to determine if a midline is the appropriate line for this individual patient. If there are contraindications, the physician and primary RN has been contacted by [facility] RN for further discussion. °Midline Education: °a midline is a long peripheral IV placed in the upper arm with the tip located at or near the axilla and distal to the shoulder °should not be used as a CLABSI preventative measure   °it has one lumen only °it can remain in place for up to 29 days  °Safe for Vancomycin infusion LESS THAN 6 days °Is safe for power injection if good blood return can be obtained and line flushes easily °it CANNOT be used for continuous infusion of vesicants. Including TPN and chemotherapy °Should NOT be placed for sole intent of obtaining labs as there is no guarantee blood can be successfully drawn from line  °contraindicated in patients with thrombosis, hypercoagulability, decreased venous flow to the extremities, ESRD (without a nephrologist's approval), small vessels, allergy to polyurethane, or known/suspected presence of a device-related infection, bacteremia, or septicemia  °

## 2023-05-02 NOTE — Progress Notes (Signed)
   Date of Admission:  03/23/2023       Assessment/Plan: MSSA bacteremia 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 Posterolateral arthrodesis L1 to L4 3. Posterior segmental instrumentation L1 to L4  4.  Lumbar decompression with total facetectomy on the right at L2-3 5.  Allograft placement in the 2 3 disc space for arthrodesis  Culture positive for MSSA On cefazolin ( was on nafcillin before)  and rifampin Will need 6 more weeks 05/22/23)   Watch LFTS/ CBC closely while on rifampin because eof hepc and possible liver cirrhosis Low threshold to stop rifampin  TEE not needed as it will not change management   B/l psoas abscesses ,     Chronic Hepatitis C- has not ben treated- will need management as OP VL [id number]   Splenomegaly likely due to portal hypertension likely cirrhosis pancytopenia   IVDA- cannot be sent home with PICC line and IV antibiotic   ID is following peripherally

## 2023-05-02 NOTE — Consult Note (Signed)
PHARMACY CONSULT NOTE - ELECTROLYTES  Pharmacy Consult for Electrolyte Monitoring and Replacement   Recent Labs: Height: 6\' 2"  (188 cm) Weight: 114.7 kg (252 lb 14.4 oz) IBW/kg (Calculated) : 82.2 Estimated Creatinine Clearance: 166.9 mL/min (by C-G formula based on SCr of 0.64 mg/dL). Potassium (mmol/L)  Date Value  04/30/2023 3.5   Magnesium (mg/dL)  Date Value  13/24/4010 1.9   Calcium (mg/dL)  Date Value  27/25/3664 8.3 (L)   Albumin (g/dL)  Date Value  40/34/7425 3.4 (L)   Phosphorus (mg/dL)  Date Value  95/63/8756 3.8   Sodium (mmol/L)  Date Value  04/30/2023 137   Corrected Ca: 8.78 mg/dL  Assessment  Adam Cabrera is a 37 y.o. male presenting with osteomyelitis. PMH significant for hypertension, scrotal cellulitis, history of IV drug use. Pharmacy has been consulted to monitor and replace electrolytes.  Diet: regular  Goal of Therapy: Electrolytes WNL  Plan:  No replacement indicated Check BMP, Mg, Phos with AM labs  Thank you for allowing pharmacy to be a part of this patient's care.  Bettey Costa, PharmD Clinical Pharmacist 05/02/2023 2:38 PM

## 2023-05-02 NOTE — Plan of Care (Signed)
  Problem: Education: Goal: Knowledge of General Education information will improve Description: Including pain rating scale, medication(s)/side effects and non-pharmacologic comfort measures Outcome: Progressing   Problem: Health Behavior/Discharge Planning: Goal: Ability to manage health-related needs will improve Outcome: Progressing   Problem: Activity: Goal: Risk for activity intolerance will decrease Outcome: Progressing   Problem: Coping: Goal: Level of anxiety will decrease Outcome: Progressing   Problem: Elimination: Goal: Will not experience complications related to bowel motility Outcome: Progressing   

## 2023-05-02 NOTE — Progress Notes (Signed)
[facility]  - [place] at [facility]   PATIENT NAME: Adam Cabrera    MR#:  [id number]  DATE OF BIRTH:  [birth year]  SUBJECTIVE:  Reports some back pain and waiting for pain medicine VITALS:  Blood pressure (!) 99/51, pulse 73, temperature 98.3 F (36.8 C), resp. rate 18, height 6\' 2"  (1.88 m), weight 114.7 kg, SpO2 98%.  PHYSICAL EXAMINATION:   GENERAL:  37 y.o.-year-old patient with no acute distress.  NEUROLOGIC: nonfocal  patient is alert and awake SKIN: tattoo + Heart regular rate and rhythm LABORATORY PANEL:  CBC Recent Labs  Lab 05/02/23 0510  WBC 3.8*  HGB 10.1*  HCT 33.0*  PLT 107*    Chemistries  Recent Labs  Lab 04/30/23 0618  NA 137  K 3.5  CL 105  CO2 25  GLUCOSE 96  BUN 9  CREATININE 0.64  CALCIUM 8.3*  AST 18  ALT 11  ALKPHOS 56  BILITOT 0.8    Assessment and Plan  37 y.o. male with medical history significant for hypertension, scrotal cellulitis, history of IV drug use, presents with low back pain.  MRI of the lumbar spine showed Epidural extension with epidural phlegmon/abscess within the ventral epidural space extending from L1-2 through L3-4.  Blood culture positive for Staph aureus, patient treated with cefepime and vancomycin.  Patient has been seen by ID and neurosurgery, L spine washout performed 8/19.   Patient had a worsening back pain since 9/3, Patient was brought to the OR again 9/5. Had: 1. Transforaminal Lumbar Interbody Fusion L2-3.  2. Posterolateral arthrodesis L1 to L4 3. Posterior segmental instrumentation L1 to L4; 4.  Lumbar decompression with total facetectomy on the right at L2-3;  5.  Allograft placement in the 2 3 disc space for arthrodesis.   One of the wound cultures from 9/5 growing rare Staph aureus.   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  On 9/5, patient had a another neurosurgical procedure which  included transforaminal lumbar interbody fusion of L2/L3, posterolateral arthrodesis L1-L4, posterior segment instrumentation L1-L4, lumbar decompression with total fasciotomy on the right at L2-L3, allograft placement in the 2/3 disc space for arthrodesis.   --ID recommends 6 weeks total of IV antibiotics.  Since one of the cultures from 9/5 growing Staph aureus may have to be 6 weeks of antibiotics from that culture.  Last dose will be on 05/22/2023  MSSA (methicillin susceptible Staphylococcus aureus) septicemia (HCC) --Present on admission with tachycardia, tachypnea and epidural abscess. --  Continue continuous IV cefazolin and rifampin--till 05/22/2023   Psoas abscess, left (HCC) --Continue abxs as above   Thrombocytopenia (HCC) -- Hepatitis C viral load positive, hepatitis A negative, hepatitis B surface antigen negative -plt trending down.  129>101.  Will hold Lovenox for now   BRBPR --Anusol suppository as needed--improved   Obesity (BMI 30-39.9) BMI 32.92   Hypokalemia Pharmacy consult for electrolyte management   Iron deficiency anemia --Last hemoglobin 10.1 --Combination of iron deficiency anemia and postoperative anemia.   -Continue oral iron.  -- Likely hemorrhoids also contributing to anemia.  Anusol suppository as needed.    Headache As needed Fioricet.    Back pain Continue MS Contin 15 mg twice a day.  Continue as needed oxycodone.  --9/19-- discussed with patient regarding pain meds MS Contin 15 mg BID and oxycodone 10 mg Q6 PRN. IV Dilaudid discontinued. Patient agreeable. Pain manageable for now  CODE STATUS: full  code DVT Prophylaxis :lovenox Level of care: Med-Surg Status is: Inpatient Remains inpatient appropriate because: Needs to complete IV abxs in the hospital until 05/22/2023    TOTAL TIME TAKING CARE OF THIS PATIENT: 15 minutes.  >50% time spent on counselling and coordination of care  Note: This dictation was prepared with Dragon dictation  along with smaller phrase technology. Any transcriptional errors that result from this process are unintentional.  Delfino Lovett M.D    [facility]   CC: Primary care physician; Patient, No Pcp Per

## 2023-05-03 DIAGNOSIS — G061 Intraspinal abscess and granuloma: Secondary | ICD-10-CM | POA: Diagnosis not present

## 2023-05-03 DIAGNOSIS — K6812 Psoas muscle abscess: Secondary | ICD-10-CM | POA: Diagnosis not present

## 2023-05-03 DIAGNOSIS — A4101 Sepsis due to Methicillin susceptible Staphylococcus aureus: Secondary | ICD-10-CM | POA: Diagnosis not present

## 2023-05-03 DIAGNOSIS — D696 Thrombocytopenia, unspecified: Secondary | ICD-10-CM | POA: Diagnosis not present

## 2023-05-03 LAB — BASIC METABOLIC PANEL
Anion gap: 6 (ref 5–15)
BUN: 11 mg/dL (ref 6–20)
CO2: 27 mmol/L (ref 22–32)
Calcium: 8.7 mg/dL — ABNORMAL LOW (ref 8.9–10.3)
Chloride: 105 mmol/L (ref 98–111)
Creatinine, Ser: 0.57 mg/dL — ABNORMAL LOW (ref 0.61–1.24)
GFR, Estimated: >60 mL/min (ref >60)
Glucose, Bld: 103 mg/dL — ABNORMAL HIGH (ref 70–99)
Potassium: 3.8 mmol/L (ref 3.5–5.1)
Sodium: 138 mmol/L (ref 135–145)

## 2023-05-03 LAB — MAGNESIUM: Magnesium: 1.8 mg/dL (ref 1.7–2.4)

## 2023-05-03 LAB — PHOSPHORUS: Phosphorus: 4.8 mg/dL — ABNORMAL HIGH (ref 2.5–4.6)

## 2023-05-03 MED ORDER — MAGNESIUM SULFATE 2 GM/50ML IV SOLN
2.0000 g | Freq: Once | INTRAVENOUS | Status: AC
  Administered 2023-05-03: 2 g via INTRAVENOUS
  Filled 2023-05-03: qty 50

## 2023-05-03 NOTE — Plan of Care (Signed)
Patient has remained safe and free of injury or falls. Patient reports pain relieved by medication. Patient will continue to stay inpatient while receiving his IV antibiotics. Labs are improving. DCP and progress are ongoing. SN will continue to monitor the plan of care.

## 2023-05-03 NOTE — Progress Notes (Signed)
[facility]  - [place] at [facility]   PATIENT NAME: Adam Cabrera    MR#:  [id number]  DATE OF BIRTH:  1986/01/21  SUBJECTIVE:  Had a new midline IV placed yesterday.  Patient sitting in the chair and feeling much better.  Wants to take shower VITALS:  Blood pressure 117/70, pulse 80, temperature 97.8 F (36.6 C), resp. rate 18, height 6\' 2"  (1.88 m), weight 113.9 kg, SpO2 99%.  PHYSICAL EXAMINATION:   GENERAL:  37 y.o.-year-old patient with no acute distress.  NEUROLOGIC: nonfocal  patient is alert and awake SKIN: tattoo + Heart regular rate and rhythm LABORATORY PANEL:  CBC Recent Labs  Lab 05/02/23 0510  WBC 3.8*  HGB 10.1*  HCT 33.0*  PLT 107*    Chemistries  Recent Labs  Lab 04/30/23 0618 05/03/23 0435  NA 137 138  K 3.5 3.8  CL 105 105  CO2 25 27  GLUCOSE 96 103*  BUN 9 11  CREATININE 0.64 0.57*  CALCIUM 8.3* 8.7*  MG  --  1.8  AST 18  --   ALT 11  --   ALKPHOS 56  --   BILITOT 0.8  --     Assessment and Plan  37 y.o. male with medical history significant for hypertension, scrotal cellulitis, history of IV drug use, presents with low back pain.  MRI of the lumbar spine showed Epidural extension with epidural phlegmon/abscess within the ventral epidural space extending from L1-2 through L3-4.  Blood culture positive for Staph aureus, patient treated with cefepime and vancomycin.  Patient has been seen by ID and neurosurgery, L spine washout performed 8/19.   Patient had a worsening back pain since 9/3, Patient was brought to the OR again 9/5. Had: 1. Transforaminal Lumbar Interbody Fusion L2-3.  2. Posterolateral arthrodesis L1 to L4 3. Posterior segmental instrumentation L1 to L4; 4.  Lumbar decompression with total facetectomy on the right at L2-3;  5.  Allograft placement in the 2 3 disc space for arthrodesis.   One of the wound cultures from 9/5 growing rare Staph aureus.   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  On 9/5, patient had a another neurosurgical procedure which included transforaminal lumbar interbody fusion of L2/L3, posterolateral arthrodesis L1-L4, posterior segment instrumentation L1-L4, lumbar decompression with total fasciotomy on the right at L2-L3, allograft placement in the 2/3 disc space for arthrodesis.   --ID recommends 6 weeks total of IV antibiotics.  Since one of the cultures from 9/5 growing Staph aureus may have to be 6 weeks of antibiotics from that culture.  Last dose will be on 05/22/2023.  Will check LFTs tomorrow considering him being on rifampin - New midline placed on 9/27  MSSA (methicillin susceptible Staphylococcus aureus) septicemia (HCC) --Present on admission with tachycardia, tachypnea and epidural abscess. --  Continue continuous IV cefazolin and rifampin--till 05/22/2023   Psoas abscess, left (HCC) --Continue abxs as above   Thrombocytopenia (HCC) -- Hepatitis C viral load positive, hepatitis A negative, hepatitis B surface antigen negative -plt trending down.  129>101.  Will hold Lovenox for now -Ordered SCDs   BRBPR --Anusol suppository as needed--improved   Obesity (BMI 30-39.9) BMI 32.92   Hypokalemia Pharmacy consult for electrolyte management   Iron deficiency anemia --Last hemoglobin 10.1 --Combination of iron deficiency anemia and postoperative anemia.   -Continue oral iron.  -- Likely hemorrhoids also contributing to anemia.  Anusol suppository as  needed.    Headache As needed Fioricet.    Back pain Continue MS Contin 15 mg twice a day.  Continue as needed oxycodone.  --9/19-- discussed with patient regarding pain meds MS Contin 15 mg BID and oxycodone 10 mg Q6 PRN. IV Dilaudid discontinued. Patient agreeable. Pain manageable for now  CODE STATUS: full code DVT Prophylaxis :lovenox Level of care: Med-Surg Status is: Inpatient Remains inpatient  appropriate because: Needs to complete IV abxs in the hospital until 05/22/2023    TOTAL TIME TAKING CARE OF THIS PATIENT: 15 minutes.  >50% time spent on counselling and coordination of care  Note: This dictation was prepared with Dragon dictation along with smaller phrase technology. Any transcriptional errors that result from this process are unintentional.  Delfino Lovett M.D    [facility]   CC: Primary care physician; Patient, No Pcp Per

## 2023-05-03 NOTE — Plan of Care (Signed)

## 2023-05-03 NOTE — Consult Note (Signed)
PHARMACY CONSULT NOTE - ELECTROLYTES  Pharmacy Consult for Electrolyte Monitoring and Replacement   Recent Labs: Height: 6\' 2"  (188 cm) Weight: 113.9 kg (251 lb 1.7 oz) IBW/kg (Calculated) : 82.2 Estimated Creatinine Clearance: 166.4 mL/min (A) (by C-G formula based on SCr of 0.57 mg/dL (L)). Potassium (mmol/L)  Date Value  05/03/2023 3.8   Magnesium (mg/dL)  Date Value  16/05/9603 1.8   Calcium (mg/dL)  Date Value  54/04/8118 8.7 (L)   Albumin (g/dL)  Date Value  14/78/2956 3.4 (L)   Phosphorus (mg/dL)  Date Value  21/30/8657 4.8 (H)   Sodium (mmol/L)  Date Value  05/03/2023 138   Assessment  Adam Cabrera is a 37 y.o. male presenting with osteomyelitis. PMH significant for hypertension, scrotal cellulitis, history of IV drug use. Pharmacy has been consulted to monitor and replace electrolytes.  Diet: Regular  Goal of Therapy: Electrolytes WNL  Plan:  Mg 1.8, magnesium sulfate 2 g IV x 1 Labs have been stable overall, no need to check daily. Will consider checking q2-3 days  Thank you for allowing pharmacy to be a part of this patient's care.  Tressie Ellis 05/03/2023 6:43 AM

## 2023-05-04 DIAGNOSIS — G061 Intraspinal abscess and granuloma: Secondary | ICD-10-CM | POA: Diagnosis not present

## 2023-05-04 DIAGNOSIS — A4101 Sepsis due to Methicillin susceptible Staphylococcus aureus: Secondary | ICD-10-CM | POA: Diagnosis not present

## 2023-05-04 DIAGNOSIS — D696 Thrombocytopenia, unspecified: Secondary | ICD-10-CM | POA: Diagnosis not present

## 2023-05-04 DIAGNOSIS — K6812 Psoas muscle abscess: Secondary | ICD-10-CM | POA: Diagnosis not present

## 2023-05-04 LAB — HEPATIC FUNCTION PANEL
ALT: 15 U/L (ref 0–44)
AST: 24 U/L (ref 15–41)
Albumin: 3.3 g/dL — ABNORMAL LOW (ref 3.5–5.0)
Alkaline Phosphatase: 66 U/L (ref 38–126)
Bilirubin, Direct: <0.1 mg/dL (ref 0.0–0.2)
Total Bilirubin: 0.7 mg/dL (ref 0.3–1.2)
Total Protein: 6.6 g/dL (ref 6.5–8.1)

## 2023-05-04 NOTE — Progress Notes (Signed)
[facility] Specialist - Progress Note   05/04/23 1243  Mobility  Activity Ambulated independently in hallway  Level of Assistance Independent  Assistive Device None  Distance Ambulated (ft) 1000 ft  Activity Response Tolerated well  Mobility Referral Yes  $Mobility charge 1 Mobility  Mobility Specialist Start Time (ACUTE ONLY) 1132  Mobility Specialist Stop Time (ACUTE ONLY) 1159  Mobility Specialist Time Calculation (min) (ACUTE ONLY) 27 min   Pt sitting in recliner on RA upon arrival. Pt STS and ambulates to/from [facility] indep. Pt returns to recliner with needs in reach.   Terrilyn Saver  Mobility Specialist  05/04/23 12:45 PM

## 2023-05-04 NOTE — Progress Notes (Signed)
[facility]  - [place] at [facility]   PATIENT NAME: Adam Cabrera    MR#:  [id number]  DATE OF BIRTH:  April 19, 1986  SUBJECTIVE:  No new issues VITALS:  Blood pressure 117/72, pulse 77, temperature 98.1 F (36.7 C), resp. rate 18, height 6\' 2"  (1.88 m), weight 113.9 kg, SpO2 100%.  PHYSICAL EXAMINATION:   GENERAL:  37 y.o.-year-old patient with no acute distress.  NEUROLOGIC: nonfocal  patient is alert and awake SKIN: tattoo + Heart regular rate and rhythm LABORATORY PANEL:  CBC Recent Labs  Lab 05/02/23 0510  WBC 3.8*  HGB 10.1*  HCT 33.0*  PLT 107*    Chemistries  Recent Labs  Lab 05/03/23 0435 05/04/23 0319  NA 138  --   K 3.8  --   CL 105  --   CO2 27  --   GLUCOSE 103*  --   BUN 11  --   CREATININE 0.57*  --   CALCIUM 8.7*  --   MG 1.8  --   AST  --  24  ALT  --  15  ALKPHOS  --  66  BILITOT  --  0.7    Assessment and Plan  37 y.o. male with medical history significant for hypertension, scrotal cellulitis, history of IV drug use, presents with low back pain.  MRI of the lumbar spine showed Epidural extension with epidural phlegmon/abscess within the ventral epidural space extending from L1-2 through L3-4.  Blood culture positive for Staph aureus, patient treated with cefepime and vancomycin.  Patient has been seen by ID and neurosurgery, L spine washout performed 8/19.   Patient had a worsening back pain since 9/3, Patient was brought to the OR again 9/5. Had: 1. Transforaminal Lumbar Interbody Fusion L2-3.  2. Posterolateral arthrodesis L1 to L4 3. Posterior segmental instrumentation L1 to L4; 4.  Lumbar decompression with total facetectomy on the right at L2-3;  5.  Allograft placement in the 2 3 disc space for arthrodesis.   One of the wound cultures from 9/5 growing rare Staph aureus.   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  On 9/5,  patient had a another neurosurgical procedure which included transforaminal lumbar interbody fusion of L2/L3, posterolateral arthrodesis L1-L4, posterior segment instrumentation L1-L4, lumbar decompression with total fasciotomy on the right at L2-L3, allograft placement in the 2/3 disc space for arthrodesis.   --ID recommends 6 weeks total of IV antibiotics.  Since one of the cultures from 9/5 growing Staph aureus may have to be 6 weeks of antibiotics from that culture.  Last dose will be on 05/22/2023.  Normal LFTs on 9/29-checked considering him being on rifampin - New midline placed on 9/27  MSSA (methicillin susceptible Staphylococcus aureus) septicemia (HCC) --Present on admission with tachycardia, tachypnea and epidural abscess. --  Continue continuous IV cefazolin and rifampin--till 05/22/2023   Psoas abscess, left (HCC) --Continue abxs as above   Thrombocytopenia (HCC) -- Hepatitis C viral load positive, hepatitis A negative, hepatitis B surface antigen negative -plt trending down.  129>107.  Will hold Lovenox for now -Continue SCDs   BRBPR --Anusol suppository as needed--improved   Obesity (BMI 30-39.9) BMI 32.92   Hypokalemia Pharmacy consult for electrolyte management   Iron deficiency anemia --Last hemoglobin 10.1 --Combination of iron deficiency anemia and postoperative anemia.   -Continue oral iron.  -- Likely hemorrhoids also contributing to anemia.  Anusol suppository as needed.  Headache As needed Fioricet.    Back pain Continue MS Contin 15 mg twice a day.  Continue as needed oxycodone.  --9/19-- discussed with patient regarding pain meds MS Contin 15 mg BID and oxycodone 10 mg Q6 PRN. IV Dilaudid discontinued. Patient agreeable. Pain manageable for now  CODE STATUS: full code DVT Prophylaxis :lovenox Level of care: Med-Surg Status is: Inpatient Remains inpatient appropriate because: Needs to complete IV abxs in the hospital until 05/22/2023    TOTAL  TIME TAKING CARE OF THIS PATIENT: 15 minutes.  >50% time spent on counselling and coordination of care  Note: This dictation was prepared with Dragon dictation along with smaller phrase technology. Any transcriptional errors that result from this process are unintentional.  Delfino Lovett M.D    [facility]   CC: Primary care physician; Patient, No Pcp Per

## 2023-05-04 NOTE — Progress Notes (Signed)
Pt spoke to RN regarding blood draws via PICC line. He was informed that per doctors orders, blood can be drawn from midline. He was also educated that it may also increase his risk of getting infection (especially since he is getting treated for infection).Pt verbalized understanding "it makes sense".   Patient had phlebotomy done today for his morning labs in efforts of infection prevention. Patient also understands that he can request nurses to have labs drawn via PICC if he chooses to in the future.

## 2023-05-04 NOTE — Plan of Care (Signed)
  Problem: Education: Goal: Knowledge of General Education information will improve Description: Including pain rating scale, medication(s)/side effects and non-pharmacologic comfort measures Outcome: Progressing   Problem: Health Behavior/Discharge Planning: Goal: Ability to manage health-related needs will improve Outcome: Progressing   Problem: Clinical Measurements: Goal: Ability to maintain clinical measurements within normal limits will improve Outcome: Progressing Goal: Will remain free from infection Outcome: Progressing Goal: Diagnostic test results will improve Outcome: Progressing Goal: Cardiovascular complication will be avoided Outcome: Progressing   Problem: Activity: Goal: Risk for activity intolerance will decrease Outcome: Progressing   Problem: Nutrition: Goal: Adequate nutrition will be maintained Outcome: Progressing   Problem: Coping: Goal: Level of anxiety will decrease Outcome: Progressing   Problem: Elimination: Goal: Will not experience complications related to bowel motility Outcome: Progressing   Problem: Pain Managment: Goal: General experience of comfort will improve Outcome: Progressing   Problem: Skin Integrity: Goal: Risk for impaired skin integrity will decrease Outcome: Progressing   Problem: Education: Goal: Knowledge of General Education information will improve Description: Including pain rating scale, medication(s)/side effects and non-pharmacologic comfort measures Outcome: Progressing   Problem: Health Behavior/Discharge Planning: Goal: Ability to manage health-related needs will improve Outcome: Progressing   Problem: Activity: Goal: Risk for activity intolerance will decrease Outcome: Progressing

## 2023-05-04 NOTE — Plan of Care (Signed)
  Problem: Activity: Goal: Risk for activity intolerance will decrease Outcome: Progressing   Problem: Education: Goal: Knowledge of General Education information will improve Description: Including pain rating scale, medication(s)/side effects and non-pharmacologic comfort measures Outcome: Progressing   Problem: Pain Managment: Goal: General experience of comfort will improve Outcome: Progressing   Problem: Skin Integrity: Goal: Risk for impaired skin integrity will decrease Outcome: Progressing   Problem: Elimination: Goal: Will not experience complications related to bowel motility Outcome: Progressing

## 2023-05-05 ENCOUNTER — Encounter: Payer: Medicaid Other | Admitting: Neurosurgery

## 2023-05-05 DIAGNOSIS — M8608 Acute hematogenous osteomyelitis, other sites: Secondary | ICD-10-CM | POA: Diagnosis not present

## 2023-05-05 DIAGNOSIS — G061 Intraspinal abscess and granuloma: Secondary | ICD-10-CM | POA: Diagnosis not present

## 2023-05-05 DIAGNOSIS — R7881 Bacteremia: Secondary | ICD-10-CM | POA: Diagnosis not present

## 2023-05-05 DIAGNOSIS — M4856XA Collapsed vertebra, not elsewhere classified, lumbar region, initial encounter for fracture: Secondary | ICD-10-CM | POA: Diagnosis not present

## 2023-05-05 LAB — CBC
HCT: 36.5 % — ABNORMAL LOW (ref 39.0–52.0)
Hemoglobin: 11.2 g/dL — ABNORMAL LOW (ref 13.0–17.0)
MCH: 28.4 pg (ref 26.0–34.0)
MCHC: 30.7 g/dL (ref 30.0–36.0)
MCV: 92.6 fL (ref 80.0–100.0)
Platelets: 124 10*3/uL — ABNORMAL LOW (ref 150–400)
RBC: 3.94 MIL/uL — ABNORMAL LOW (ref 4.22–5.81)
RDW: 16.2 % — ABNORMAL HIGH (ref 11.5–15.5)
WBC: 3.7 10*3/uL — ABNORMAL LOW (ref 4.0–10.5)
nRBC: 0 % (ref 0.0–0.2)

## 2023-05-05 NOTE — Plan of Care (Signed)
  Problem: Pain Managment: Goal: General experience of comfort will improve Outcome: Progressing   Problem: Activity: Goal: Risk for activity intolerance will decrease Outcome: Progressing   Problem: Education: Goal: Knowledge of General Education information will improve Description: Including pain rating scale, medication(s)/side effects and non-pharmacologic comfort measures Outcome: Progressing   Problem: Elimination: Goal: Will not experience complications related to bowel motility Outcome: Progressing   Problem: Nutrition: Goal: Adequate nutrition will be maintained Outcome: Progressing

## 2023-05-05 NOTE — Plan of Care (Signed)
  Problem: Health Behavior/Discharge Planning: Goal: Ability to manage health-related needs will improve Outcome: Progressing   Problem: Activity: Goal: Risk for activity intolerance will decrease Outcome: Progressing   Problem: Nutrition: Goal: Adequate nutrition will be maintained Outcome: Progressing   Problem: Coping: Goal: Level of anxiety will decrease Outcome: Progressing   Problem: Elimination: Goal: Will not experience complications related to bowel motility Outcome: Progressing   Problem: Pain Managment: Goal: General experience of comfort will improve Outcome: Progressing   Problem: Skin Integrity: Goal: Risk for impaired skin integrity will decrease Outcome: Progressing   Problem: Education: Goal: Knowledge of General Education information will improve Description: Including pain rating scale, medication(s)/side effects and non-pharmacologic comfort measures Outcome: Progressing   Problem: Health Behavior/Discharge Planning: Goal: Ability to manage health-related needs will improve Outcome: Progressing   Problem: Activity: Goal: Risk for activity intolerance will decrease Outcome: Progressing

## 2023-05-05 NOTE — Consult Note (Signed)
PHARMACY CONSULT NOTE - ELECTROLYTES  Pharmacy Consult for Electrolyte Monitoring and Replacement   Recent Labs: Height: 6\' 2"  (188 cm) Weight: 117.5 kg (259 lb 0.7 oz) IBW/kg (Calculated) : 82.2 Estimated Creatinine Clearance: 168.9 mL/min (A) (by C-G formula based on SCr of 0.57 mg/dL (L)). Potassium (mmol/L)  Date Value  05/03/2023 3.8   Magnesium (mg/dL)  Date Value  82/95/6213 1.8   Calcium (mg/dL)  Date Value  08/65/7846 8.7 (L)   Albumin (g/dL)  Date Value  96/29/5284 3.3 (L)   Phosphorus (mg/dL)  Date Value  13/24/4010 4.8 (H)   Sodium (mmol/L)  Date Value  05/03/2023 138   Assessment  Adam Cabrera is a 37 y.o. male presenting with osteomyelitis. PMH significant for hypertension, scrotal cellulitis, history of IV drug use. Pharmacy has been consulted to monitor and replace electrolytes.  Diet: Regular  Goal of Therapy: Electrolytes WNL  Plan:  Labs have been stable overall, no need to check daily. Will consider checking q2-3 days.  Order for AM of 10/1  Thank you for allowing pharmacy to be a part of this patient's care.  Barrie Folk, PharmD 05/05/2023 7:58 AM

## 2023-05-05 NOTE — Progress Notes (Signed)
[facility]  - [place] at [facility]   PATIENT NAME: Adam Cabrera    MR#:  [id number]  DATE OF BIRTH:  01/04/1986  SUBJECTIVE:  No new issues, sitting in the chair and comfortable VITALS:  Blood pressure 116/75, pulse 78, temperature 98.2 F (36.8 C), resp. rate 14, height 6\' 2"  (1.88 m), weight 117.5 kg, SpO2 96%.  PHYSICAL EXAMINATION:   GENERAL:  37 y.o.-year-old patient with no acute distress.  NEUROLOGIC: nonfocal  patient is alert and awake SKIN: tattoo + Heart regular rate and rhythm LABORATORY PANEL:  CBC Recent Labs  Lab 05/05/23 0721  WBC 3.7*  HGB 11.2*  HCT 36.5*  PLT 124*    Chemistries  Recent Labs  Lab 05/03/23 0435 05/04/23 0319  NA 138  --   K 3.8  --   CL 105  --   CO2 27  --   GLUCOSE 103*  --   BUN 11  --   CREATININE 0.57*  --   CALCIUM 8.7*  --   MG 1.8  --   AST  --  24  ALT  --  15  ALKPHOS  --  66  BILITOT  --  0.7    Assessment and Plan  37 y.o. male with medical history significant for hypertension, scrotal cellulitis, history of IV drug use, presents with low back pain.  MRI of the lumbar spine showed Epidural extension with epidural phlegmon/abscess within the ventral epidural space extending from L1-2 through L3-4.  Blood culture positive for Staph aureus, patient treated with cefepime and vancomycin.  Patient has been seen by ID and neurosurgery, L spine washout performed 8/19.   Patient had a worsening back pain since 9/3, Patient was brought to the OR again 9/5. Had: 1. Transforaminal Lumbar Interbody Fusion L2-3.  2. Posterolateral arthrodesis L1 to L4 3. Posterior segmental instrumentation L1 to L4; 4.  Lumbar decompression with total facetectomy on the right at L2-3;  5.  Allograft placement in the 2 3 disc space for arthrodesis.   One of the wound cultures from 9/5 growing rare Staph aureus.   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  On 9/5, patient had a another neurosurgical procedure which included transforaminal lumbar interbody fusion of L2/L3, posterolateral arthrodesis L1-L4, posterior segment instrumentation L1-L4, lumbar decompression with total fasciotomy on the right at L2-L3, allograft placement in the 2/3 disc space for arthrodesis.   --ID recommends 6 weeks total of IV antibiotics.  Since one of the cultures from 9/5 growing Staph aureus may have to be 6 weeks of antibiotics from that culture.  Last dose will be on 05/22/2023.  Normal LFTs on 9/29-checked considering him being on rifampin - New midline placed on 9/27  MSSA (methicillin susceptible Staphylococcus aureus) septicemia (HCC) --Present on admission with tachycardia, tachypnea and epidural abscess. --  Continue continuous IV cefazolin and rifampin--till 05/22/2023   Psoas abscess, left (HCC) --Continue abxs as above   Thrombocytopenia (HCC) -- Hepatitis C viral load positive, hepatitis A negative, hepatitis B surface antigen negative -plt trending down.  129>107.  Will hold Lovenox for now -Continue SCDs   BRBPR --Anusol suppository as needed--improved   Obesity (BMI 30-39.9) BMI 32.92   Hypokalemia Pharmacy consult for electrolyte management   Iron deficiency anemia --Last hemoglobin 10.1 --Combination of iron deficiency anemia and postoperative anemia.   -Continue oral iron.  -- Likely hemorrhoids also contributing to anemia.  Anusol suppository as needed.    Headache As needed Fioricet.    Back pain Continue MS Contin 15 mg twice a day.  Continue as needed oxycodone.  --9/19-- discussed with patient regarding pain meds MS Contin 15 mg BID and oxycodone 10 mg Q6 PRN. IV Dilaudid discontinued. Patient agreeable. Pain manageable for now  CODE STATUS: full code DVT Prophylaxis : SCDs Level of care: Med-Surg Status is: Inpatient Remains inpatient appropriate because: Needs to complete IV abxs in the  hospital until 05/22/2023    TOTAL TIME TAKING CARE OF THIS PATIENT: 15 minutes.  >50% time spent on counselling and coordination of care  Note: This dictation was prepared with Dragon dictation along with smaller phrase technology. Any transcriptional errors that result from this process are unintentional.  Delfino Lovett M.D    [facility]   CC: Primary care physician; Patient, No Pcp Per

## 2023-05-06 DIAGNOSIS — R7881 Bacteremia: Secondary | ICD-10-CM | POA: Diagnosis not present

## 2023-05-06 DIAGNOSIS — D696 Thrombocytopenia, unspecified: Secondary | ICD-10-CM | POA: Diagnosis not present

## 2023-05-06 DIAGNOSIS — M8608 Acute hematogenous osteomyelitis, other sites: Secondary | ICD-10-CM | POA: Diagnosis not present

## 2023-05-06 DIAGNOSIS — G061 Intraspinal abscess and granuloma: Secondary | ICD-10-CM | POA: Diagnosis not present

## 2023-05-06 LAB — BASIC METABOLIC PANEL
Anion gap: 5 (ref 5–15)
BUN: 11 mg/dL (ref 6–20)
CO2: 25 mmol/L (ref 22–32)
Calcium: 8.4 mg/dL — ABNORMAL LOW (ref 8.9–10.3)
Chloride: 105 mmol/L (ref 98–111)
Creatinine, Ser: 0.51 mg/dL — ABNORMAL LOW (ref 0.61–1.24)
GFR, Estimated: >60 mL/min (ref >60)
Glucose, Bld: 102 mg/dL — ABNORMAL HIGH (ref 70–99)
Potassium: 3.7 mmol/L (ref 3.5–5.1)
Sodium: 135 mmol/L (ref 135–145)

## 2023-05-06 LAB — MAGNESIUM: Magnesium: 1.8 mg/dL (ref 1.7–2.4)

## 2023-05-06 MED ORDER — MAGNESIUM SULFATE 2 GM/50ML IV SOLN
2.0000 g | Freq: Once | INTRAVENOUS | Status: AC
  Administered 2023-05-06: 2 g via INTRAVENOUS
  Filled 2023-05-06: qty 50

## 2023-05-06 NOTE — Plan of Care (Signed)
  Problem: Nutrition: Goal: Adequate nutrition will be maintained Outcome: Progressing   Problem: Activity: Goal: Risk for activity intolerance will decrease Outcome: Progressing   Problem: Elimination: Goal: Will not experience complications related to bowel motility Outcome: Progressing   Problem: Coping: Goal: Level of anxiety will decrease Outcome: Progressing   Problem: Pain Managment: Goal: General experience of comfort will improve Outcome: Progressing   Problem: Activity: Goal: Risk for activity intolerance will decrease Outcome: Progressing   Problem: Education: Goal: Knowledge of General Education information will improve Description: Including pain rating scale, medication(s)/side effects and non-pharmacologic comfort measures Outcome: Progressing

## 2023-05-06 NOTE — Consult Note (Signed)
PHARMACY CONSULT NOTE - ELECTROLYTES  Pharmacy Consult for Electrolyte Monitoring and Replacement   Recent Labs: Height: 6\' 2"  (188 cm) Weight: 117.6 kg (259 lb 4.2 oz) IBW/kg (Calculated) : 82.2 Estimated Creatinine Clearance: 169 mL/min (A) (by C-G formula based on SCr of 0.51 mg/dL (L)). Potassium (mmol/L)  Date Value  05/06/2023 3.7   Magnesium (mg/dL)  Date Value  16/05/9603 1.8   Calcium (mg/dL)  Date Value  54/04/8118 8.4 (L)   Albumin (g/dL)  Date Value  14/78/2956 3.3 (L)   Phosphorus (mg/dL)  Date Value  21/30/8657 4.8 (H)   Sodium (mmol/L)  Date Value  05/06/2023 135   Assessment  Adam Cabrera is a 37 y.o. male presenting with osteomyelitis. PMH significant for hypertension, scrotal cellulitis, history of IV drug use. Pharmacy has been consulted to monitor and replace electrolytes.  Diet: Regular  Goal of Therapy: Electrolytes WNL  Plan:  Will give magnesium sulfate 2 g IV x 1 dose Labs have been stable overall, no need to check daily. Will consider checking q2-3 days.  Order for AM of 10/3  Thank you for allowing pharmacy to be a part of this patient's care.  Merryl Hacker, PharmD Clinical Pharmacist 05/06/2023 8:29 AM

## 2023-05-06 NOTE — Progress Notes (Signed)
[facility]  - [place] at [facility]   PATIENT NAME: Adam Cabrera    MR#:  [id number]  DATE OF BIRTH:  11-06-1985  SUBJECTIVE:  Adam new issues, sitting in the chair. VITALS:  Blood pressure 118/71, pulse 76, temperature 97.8 F (36.6 C), resp. rate 14, height 6\' 2"  (1.88 m), weight 117.6 kg, SpO2 100%.  PHYSICAL EXAMINATION:   GENERAL:  37 y.o.-year-old patient with Adam acute distress.  NEUROLOGIC: nonfocal  patient is alert and awake SKIN: tattoo + Heart regular rate and rhythm LABORATORY PANEL:  CBC Recent Labs  Lab 05/05/23 0721  WBC 3.7*  HGB 11.2*  HCT 36.5*  PLT 124*    Chemistries  Recent Labs  Lab 05/04/23 0319 05/06/23 0417  NA  --  135  K  --  3.7  CL  --  105  CO2  --  25  GLUCOSE  --  102*  BUN  --  11  CREATININE  --  0.51*  CALCIUM  --  8.4*  MG  --  1.8  AST 24  --   ALT 15  --   ALKPHOS 66  --   BILITOT 0.7  --     Assessment and Plan  37 y.o. male with medical history significant for hypertension, scrotal cellulitis, history of IV drug use, presents with low back pain.  MRI of the lumbar spine showed Epidural extension with epidural phlegmon/abscess within the ventral epidural space extending from L1-2 through L3-4.  Blood culture positive for Staph aureus, patient treated with cefepime and vancomycin.  Patient has been seen by ID and neurosurgery, L spine washout performed 8/19.   Patient had a worsening back pain since 9/3, Patient was brought to the OR again 9/5. Had: 1. Transforaminal Lumbar Interbody Fusion L2-3.  2. Posterolateral arthrodesis L1 to L4 3. Posterior segmental instrumentation L1 to L4; 4.  Lumbar decompression with total facetectomy on the right at L2-3;  5.  Allograft placement in the 2 3 disc space for arthrodesis.   One of the wound cultures from 9/5 growing rare Staph aureus.   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  On 9/5, patient had a another neurosurgical procedure which included transforaminal lumbar interbody fusion of L2/L3, posterolateral arthrodesis L1-L4, posterior segment instrumentation L1-L4, lumbar decompression with total fasciotomy on the right at L2-L3, allograft placement in the 2/3 disc space for arthrodesis.   --ID recommends 6 weeks total of IV antibiotics.  Since one of the cultures from 9/5 growing Staph aureus may have to be 6 weeks of antibiotics from that culture.  Last dose will be on 05/22/2023.  Normal LFTs on 9/29-checked considering him being on rifampin - New midline placed on 9/27  MSSA (methicillin susceptible Staphylococcus aureus) septicemia (HCC) --Present on admission with tachycardia, tachypnea and epidural abscess. --  Continue continuous IV cefazolin and rifampin--till 05/22/2023   Psoas abscess, left (HCC) --Continue abxs as above   Thrombocytopenia (HCC) -- Hepatitis C viral load positive, hepatitis A negative, hepatitis B surface antigen negative -plt trending down.  129>107.  Will hold Lovenox for now -Continue SCDs   BRBPR --Anusol suppository as needed--improved   Obesity (BMI 30-39.9) BMI 32.92   Hypokalemia Pharmacy consult for electrolyte management   Iron deficiency anemia --Last hemoglobin 10.1 --Combination of iron deficiency anemia and postoperative anemia.   -Continue oral iron.  -- Likely hemorrhoids also contributing to anemia.  Anusol suppository  as needed.    Headache As needed Fioricet.    Back pain Continue MS Contin 15 mg twice a day.  Continue as needed oxycodone.  --9/19-- discussed with patient regarding pain meds MS Contin 15 mg BID and oxycodone 10 mg Q6 PRN. IV Dilaudid discontinued. Patient agreeable. Pain manageable for now  CODE STATUS: full code DVT Prophylaxis : SCDs Level of care: Med-Surg Status is: Inpatient Remains inpatient appropriate because: Needs to complete IV abxs in the hospital  until 05/22/2023    TOTAL TIME TAKING CARE OF THIS PATIENT: 15 minutes.  >50% time spent on counselling and coordination of care  Note: This dictation was prepared with Dragon dictation along with smaller phrase technology. Any transcriptional errors that result from this process are unintentional.  Delfino Lovett M.D    [facility]   CC: Primary care physician; Patient, Adam Cabrera

## 2023-05-06 NOTE — Plan of Care (Signed)
  Problem: Activity: Goal: Risk for activity intolerance will decrease Outcome: Progressing   Problem: Nutrition: Goal: Adequate nutrition will be maintained Outcome: Progressing   Problem: Coping: Goal: Level of anxiety will decrease Outcome: Progressing   Problem: Elimination: Goal: Will not experience complications related to bowel motility Outcome: Progressing   Problem: Skin Integrity: Goal: Risk for impaired skin integrity will decrease Outcome: Progressing   Problem: Education: Goal: Knowledge of General Education information will improve Description: Including pain rating scale, medication(s)/side effects and non-pharmacologic comfort measures Outcome: Progressing   Problem: Health Behavior/Discharge Planning: Goal: Ability to manage health-related needs will improve Outcome: Progressing   Problem: Activity: Goal: Risk for activity intolerance will decrease Outcome: Progressing

## 2023-05-07 DIAGNOSIS — G061 Intraspinal abscess and granuloma: Secondary | ICD-10-CM | POA: Diagnosis not present

## 2023-05-07 LAB — CBC
HCT: 33.2 % — ABNORMAL LOW (ref 39.0–52.0)
Hemoglobin: 10.6 g/dL — ABNORMAL LOW (ref 13.0–17.0)
MCH: 28.8 pg (ref 26.0–34.0)
MCHC: 31.9 g/dL (ref 30.0–36.0)
MCV: 90.2 fL (ref 80.0–100.0)
Platelets: 110 10*3/uL — ABNORMAL LOW (ref 150–400)
RBC: 3.68 MIL/uL — ABNORMAL LOW (ref 4.22–5.81)
RDW: 15.7 % — ABNORMAL HIGH (ref 11.5–15.5)
WBC: 3.4 10*3/uL — ABNORMAL LOW (ref 4.0–10.5)
nRBC: 0 % (ref 0.0–0.2)

## 2023-05-07 MED ORDER — MAGNESIUM OXIDE -MG SUPPLEMENT 400 (240 MG) MG PO TABS
400.0000 mg | ORAL_TABLET | Freq: Two times a day (BID) | ORAL | Status: DC
  Administered 2023-05-07 – 2023-05-13 (×13): 400 mg via ORAL
  Filled 2023-05-07 (×13): qty 1

## 2023-05-07 NOTE — Plan of Care (Signed)
  Problem: Education: Goal: Knowledge of General Education information will improve Description: Including pain rating scale, medication(s)/side effects and non-pharmacologic comfort measures Outcome: Progressing   Problem: Health Behavior/Discharge Planning: Goal: Ability to manage health-related needs will improve Outcome: Progressing   Problem: Clinical Measurements: Goal: Ability to maintain clinical measurements within normal limits will improve Outcome: Progressing Goal: Will remain free from infection Outcome: Progressing Goal: Diagnostic test results will improve Outcome: Progressing Goal: Cardiovascular complication will be avoided Outcome: Progressing   Problem: Activity: Goal: Risk for activity intolerance will decrease Outcome: Progressing   Problem: Nutrition: Goal: Adequate nutrition will be maintained Outcome: Progressing   Problem: Coping: Goal: Level of anxiety will decrease Outcome: Progressing   Problem: Elimination: Goal: Will not experience complications related to bowel motility Outcome: Progressing   Problem: Pain Managment: Goal: General experience of comfort will improve Outcome: Progressing   Problem: Skin Integrity: Goal: Risk for impaired skin integrity will decrease Outcome: Progressing   Problem: Education: Goal: Knowledge of General Education information will improve Description: Including pain rating scale, medication(s)/side effects and non-pharmacologic comfort measures Outcome: Progressing   Problem: Health Behavior/Discharge Planning: Goal: Ability to manage health-related needs will improve Outcome: Progressing   Problem: Activity: Goal: Risk for activity intolerance will decrease Outcome: Progressing

## 2023-05-07 NOTE — Consult Note (Signed)
PHARMACY CONSULT NOTE - ELECTROLYTES  Pharmacy Consult for Electrolyte Monitoring and Replacement   Recent Labs: Height: 6\' 2"  (188 cm) Weight: 118.4 kg (261 lb 0.4 oz) IBW/kg (Calculated) : 82.2 Estimated Creatinine Clearance: 169.6 mL/min (A) (by C-G formula based on SCr of 0.51 mg/dL (L)). Potassium (mmol/L)  Date Value  05/06/2023 3.7   Magnesium (mg/dL)  Date Value  78/46/9629 1.8   Calcium (mg/dL)  Date Value  52/84/1324 8.4 (L)   Albumin (g/dL)  Date Value  40/05/2724 3.3 (L)   Phosphorus (mg/dL)  Date Value  36/64/4034 4.8 (H)   Sodium (mmol/L)  Date Value  05/06/2023 135   Assessment  Adam Cabrera is a 37 y.o. male presenting with osteomyelitis. PMH significant for hypertension, scrotal cellulitis, history of IV drug use. Pharmacy has been consulted to monitor and replace electrolytes.  Diet: Regular  Goal of Therapy: Electrolytes WNL  Plan:  Per conversation with MD, will initiate patient on mag oxide 400 mg bid  Transition to weekly monitoring since electrolytes have been stable  Thank you for allowing pharmacy to be a part of this patient's care.  Merryl Hacker, PharmD Clinical Pharmacist 05/07/2023 9:31 AM

## 2023-05-07 NOTE — Progress Notes (Signed)
[facility]  - [place] at [facility]   PATIENT NAME: Adam Cabrera    MR#:  [id number]  DATE OF BIRTH:  [birth year]  SUBJECTIVE:  No new issues, sitting in the chair. Able to do all his ADL's well VITALS:  Blood pressure 112/74, pulse 77, temperature 98.3 F (36.8 C), resp. rate 14, height 6\' 2"  (1.88 m), weight 118.4 kg, SpO2 95%.  PHYSICAL EXAMINATION:   GENERAL:  37 y.o.-year-old patient with no acute distress.  NEUROLOGIC: nonfocal  patient is alert and awake SKIN: tattoo + Heart regular rate and rhythm LABORATORY PANEL:  CBC Recent Labs  Lab 05/07/23 0538  WBC 3.4*  HGB 10.6*  HCT 33.2*  PLT 110*    Chemistries  Recent Labs  Lab 05/04/23 0319 05/06/23 0417  NA  --  135  K  --  3.7  CL  --  105  CO2  --  25  GLUCOSE  --  102*  BUN  --  11  CREATININE  --  0.51*  CALCIUM  --  8.4*  MG  --  1.8  AST 24  --   ALT 15  --   ALKPHOS 66  --   BILITOT 0.7  --     Assessment and Plan  37 y.o. male with medical history significant for hypertension, scrotal cellulitis, history of IV drug use, presents with low back pain.  MRI of the lumbar spine showed Epidural extension with epidural phlegmon/abscess within the ventral epidural space extending from L1-2 through L3-4.  Blood culture positive for Staph aureus, patient treated with cefepime and vancomycin.  Patient has been seen by ID and neurosurgery, L spine washout performed 8/19.   Patient had a worsening back pain since 9/3, Patient was brought to the OR again 9/5. Had: 1. Transforaminal Lumbar Interbody Fusion L2-3.  2. Posterolateral arthrodesis L1 to L4 3. Posterior segmental instrumentation L1 to L4; 4.  Lumbar decompression with total facetectomy on the right at L2-3;  5.  Allograft placement in the 2 3 disc space for arthrodesis.   One of the wound cultures from 9/5 growing rare Staph aureus.   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  On 9/5, patient had a another neurosurgical procedure which included transforaminal lumbar interbody fusion of L2/L3, posterolateral arthrodesis L1-L4, posterior segment instrumentation L1-L4, lumbar decompression with total fasciotomy on the right at L2-L3, allograft placement in the 2/3 disc space for arthrodesis.   --ID recommends 6 weeks total of IV antibiotics.  Since one of the cultures from 9/5 growing Staph aureus may have to be 6 weeks of antibiotics from that culture.  Last dose will be on 05/22/2023.  Normal LFTs on 9/29-checked considering him being on rifampin - New midline placed on 9/27  MSSA (methicillin susceptible Staphylococcus aureus) septicemia (HCC) --Present on admission with tachycardia, tachypnea and epidural abscess. --  Continue continuous IV cefazolin and rifampin--till 05/22/2023   Psoas abscess, left (HCC) --Continue abxs as above   Thrombocytopenia (HCC) -- Hepatitis C viral load positive, hepatitis A negative, hepatitis B surface antigen negative -plt trending down.  129>107.  Will hold Lovenox for now -Continue SCDs   BRBPR --Anusol suppository as needed--improved   Obesity (BMI 30-39.9) BMI 32.92   Hypokalemia Pharmacy consult for electrolyte management   Iron deficiency anemia --Last hemoglobin 10.1 --Combination of iron deficiency anemia and postoperative anemia.   -Continue oral iron.  -- Likely hemorrhoids  also contributing to anemia.  Anusol suppository as needed.    Headache As needed Fioricet.    Back pain Continue MS Contin 15 mg twice a day.  Continue as needed oxycodone.  --9/19-- discussed with patient regarding pain meds MS Contin 15 mg BID and oxycodone 10 mg Q6 PRN. IV Dilaudid discontinued. Patient agreeable. Pain manageable for now  CODE STATUS: full code DVT Prophylaxis : SCDs Level of care: Med-Surg Status is: Inpatient Remains inpatient appropriate because: Needs to complete  IV abxs in the hospital until 05/22/2023    TOTAL TIME TAKING CARE OF THIS PATIENT: 15 minutes.  >50% time spent on counselling and coordination of care  Note: This dictation was prepared with Dragon dictation along with smaller phrase technology. Any transcriptional errors that result from this process are unintentional.  Enedina Finner M.D    [facility]   CC: Primary care physician; Patient, No Pcp Per

## 2023-05-07 NOTE — Plan of Care (Signed)
  Problem: Education: °Goal: Knowledge of General Education information will improve °Description: Including pain rating scale, medication(s)/side effects and non-pharmacologic comfort measures °Outcome: Progressing °  °Problem: Clinical Measurements: °Goal: Will remain free from infection °Outcome: Progressing °Goal: Diagnostic test results will improve °Outcome: Progressing °  °Problem: Nutrition: °Goal: Adequate nutrition will be maintained °Outcome: Progressing °  °

## 2023-05-08 DIAGNOSIS — G061 Intraspinal abscess and granuloma: Secondary | ICD-10-CM | POA: Diagnosis not present

## 2023-05-08 LAB — CBC
HCT: 34.7 % — ABNORMAL LOW (ref 39.0–52.0)
Hemoglobin: 11.1 g/dL — ABNORMAL LOW (ref 13.0–17.0)
MCH: 28.6 pg (ref 26.0–34.0)
MCHC: 32 g/dL (ref 30.0–36.0)
MCV: 89.4 fL (ref 80.0–100.0)
Platelets: 116 10*3/uL — ABNORMAL LOW (ref 150–400)
RBC: 3.88 MIL/uL — ABNORMAL LOW (ref 4.22–5.81)
RDW: 15.1 % (ref 11.5–15.5)
WBC: 3.6 10*3/uL — ABNORMAL LOW (ref 4.0–10.5)
nRBC: 0 % (ref 0.0–0.2)

## 2023-05-08 LAB — HEPATIC FUNCTION PANEL
ALT: 25 U/L (ref 0–44)
AST: 39 U/L (ref 15–41)
Albumin: 3.7 g/dL (ref 3.5–5.0)
Alkaline Phosphatase: 70 U/L (ref 38–126)
Bilirubin, Direct: <0.1 mg/dL (ref 0.0–0.2)
Total Bilirubin: 0.8 mg/dL (ref 0.3–1.2)
Total Protein: 7.2 g/dL (ref 6.5–8.1)

## 2023-05-08 LAB — MAGNESIUM: Magnesium: 1.8 mg/dL (ref 1.7–2.4)

## 2023-05-08 LAB — PHOSPHORUS: Phosphorus: 4.8 mg/dL — ABNORMAL HIGH (ref 2.5–4.6)

## 2023-05-08 NOTE — Progress Notes (Signed)
[facility]  - [place] at [facility]   PATIENT NAME: Adam Cabrera    MR#:  [id number]  DATE OF BIRTH:  Jan 04, 1986  SUBJECTIVE:  No new issues, sitting in the chair. Able to do all his ADL's well VITALS:  Blood pressure 126/66, pulse 70, temperature 97.8 F (36.6 C), resp. rate 18, height 6\' 2"  (1.88 m), weight 118.4 kg, SpO2 100%.  PHYSICAL EXAMINATION:   GENERAL:  37 y.o.-year-old patient with no acute distress.  NEUROLOGIC: nonfocal  patient is alert and awake SKIN: tattoo + Heart regular rate and rhythm LABORATORY PANEL:  CBC Recent Labs  Lab 05/08/23 0300  WBC 3.6*  HGB 11.1*  HCT 34.7*  PLT 116*    Chemistries  Recent Labs  Lab 05/06/23 0417 05/08/23 0300  NA 135  --   K 3.7  --   CL 105  --   CO2 25  --   GLUCOSE 102*  --   BUN 11  --   CREATININE 0.51*  --   CALCIUM 8.4*  --   MG 1.8 1.8  AST  --  39  ALT  --  25  ALKPHOS  --  70  BILITOT  --  0.8    Assessment and Plan  37 y.o. male with medical history significant for hypertension, scrotal cellulitis, history of IV drug use, presents with low back pain.  MRI of the lumbar spine showed Epidural extension with epidural phlegmon/abscess within the ventral epidural space extending from L1-2 through L3-4.  Blood culture positive for Staph aureus, patient treated with cefepime and vancomycin.  Patient has been seen by ID and neurosurgery, L spine washout performed 8/19.   Patient had a worsening back pain since 9/3, Patient was brought to the OR again 9/5. Had: 1. Transforaminal Lumbar Interbody Fusion L2-3.  2. Posterolateral arthrodesis L1 to L4 3. Posterior segmental instrumentation L1 to L4; 4.  Lumbar decompression with total facetectomy on the right at L2-3;  5.  Allograft placement in the 2 3 disc space for arthrodesis.   One of the wound cultures from 9/5 growing rare Staph aureus.   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  On 9/5, patient had a another neurosurgical procedure which included transforaminal lumbar interbody fusion of L2/L3, posterolateral arthrodesis L1-L4, posterior segment instrumentation L1-L4, lumbar decompression with total fasciotomy on the right at L2-L3, allograft placement in the 2/3 disc space for arthrodesis.   --ID recommends 6 weeks total of IV antibiotics.  Since one of the cultures from 9/5 growing Staph aureus may have to be 6 weeks of antibiotics from that culture.  Last dose will be on 05/22/2023.  Normal LFTs on 9/29-checked considering him being on rifampin - New midline placed on 9/27  MSSA (methicillin susceptible Staphylococcus aureus) septicemia (HCC) --Present on admission with tachycardia, tachypnea and epidural abscess. --  Continue continuous IV cefazolin and rifampin--till 05/22/2023   Psoas abscess, left (HCC) --Continue abxs as above   Thrombocytopenia (HCC) -- Hepatitis C viral load positive, hepatitis A negative, hepatitis B surface antigen negative -plt trending down.  129>107.  Will hold Lovenox for now -Continue SCDs   BRBPR --Anusol suppository as needed--improved   Obesity (BMI 30-39.9) BMI 32.92   Hypokalemia Pharmacy consult for electrolyte management   Iron deficiency anemia --Last hemoglobin 10.1 --Combination of iron deficiency anemia and postoperative anemia.   -Continue oral iron.  -- Likely hemorrhoids also contributing  to anemia.  Anusol suppository as needed.     Back pain Continue MS Contin 15 mg twice a day.  Continue as needed oxycodone.  --9/19-- discussed with patient regarding pain meds MS Contin 15 mg BID and oxycodone 10 mg Q6 PRN. IV Dilaudid discontinued. Patient agreeable. Pain manageable for now  CODE STATUS: full code DVT Prophylaxis : SCDs Level of care: Med-Surg Status is: Inpatient Remains inpatient appropriate because: Needs to complete IV abxs in the hospital until  05/22/2023    TOTAL TIME TAKING CARE OF THIS PATIENT: 15 minutes.  >50% time spent on counselling and coordination of care  Note: This dictation was prepared with Dragon dictation along with smaller phrase technology. Any transcriptional errors that result from this process are unintentional.  Enedina Finner M.D    [facility]   CC: Primary care physician; Patient, No Pcp Per

## 2023-05-08 NOTE — Plan of Care (Signed)
  Problem: Education: Goal: Knowledge of General Education information will improve Description: Including pain rating scale, medication(s)/side effects and non-pharmacologic comfort measures Outcome: Progressing   Problem: Health Behavior/Discharge Planning: Goal: Ability to manage health-related needs will improve Outcome: Progressing   Problem: Clinical Measurements: Goal: Ability to maintain clinical measurements within normal limits will improve Outcome: Progressing Goal: Will remain free from infection Outcome: Progressing Goal: Diagnostic test results will improve Outcome: Progressing Goal: Cardiovascular complication will be avoided Outcome: Progressing   Problem: Activity: Goal: Risk for activity intolerance will decrease Outcome: Progressing   Problem: Nutrition: Goal: Adequate nutrition will be maintained Outcome: Progressing   Problem: Coping: Goal: Level of anxiety will decrease Outcome: Progressing   Problem: Elimination: Goal: Will not experience complications related to bowel motility Outcome: Progressing   Problem: Pain Managment: Goal: General experience of comfort will improve Outcome: Progressing   Problem: Skin Integrity: Goal: Risk for impaired skin integrity will decrease Outcome: Progressing   Problem: Education: Goal: Knowledge of General Education information will improve Description: Including pain rating scale, medication(s)/side effects and non-pharmacologic comfort measures Outcome: Progressing   Problem: Health Behavior/Discharge Planning: Goal: Ability to manage health-related needs will improve Outcome: Progressing   Problem: Activity: Goal: Risk for activity intolerance will decrease Outcome: Progressing

## 2023-05-08 NOTE — Progress Notes (Signed)
Mobility Specialist - Progress Note   05/08/23 1434  Mobility  Activity Ambulated independently in hallway  Level of Assistance Modified independent, requires aide device or extra time  Assistive Device None  Distance Ambulated (ft) 1000 ft  Activity Response Tolerated well  $Mobility charge 1 Mobility  Mobility Specialist Start Time (ACUTE ONLY) 1415  Mobility Specialist Stop Time (ACUTE ONLY) 1430  Mobility Specialist Time Calculation (min) (ACUTE ONLY) 15 min   Pt sitting in the recliner upon entry, utilizing RA. Pt dons TLSO brace indep and amb over 1000' to the medical mall-- some SOB noted throughout activity. Pt stood outside for ~5 mins before returning inside. Pt returned to the room, left seated in the recliner with needs within reach.  Zetta Bills Mobility Specialist 05/08/23 2:37 PM

## 2023-05-08 NOTE — Progress Notes (Signed)
    Date of Admission:  03/23/2023    Pt doing better Walking around  O/e looks well Chest CTA HS s1s2 Abd soft Edema legs  Labs    Latest Ref Rng & Units 05/08/2023    3:00 AM 05/07/2023    5:38 AM 05/05/2023    7:21 AM  CBC  WBC 4.0 - 10.5 K/uL 3.6  3.4  3.7   Hemoglobin 13.0 - 17.0 g/dL 16.1  09.6  04.5   Hematocrit 39.0 - 52.0 % 34.7  33.2  36.5   Platelets 150 - 400 K/uL 116  110  124        Latest Ref Rng & Units 05/08/2023    3:00 AM 05/06/2023    4:17 AM 05/04/2023    3:19 AM  CMP  Glucose 70 - 99 mg/dL  409    BUN 6 - 20 mg/dL  11    Creatinine 8.11 - 1.24 mg/dL  9.14    Sodium 782 - 956 mmol/L  135    Potassium 3.5 - 5.1 mmol/L  3.7    Chloride 98 - 111 mmol/L  105    CO2 22 - 32 mmol/L  25    Calcium 8.9 - 10.3 mg/dL  8.4    Total Protein 6.5 - 8.1 g/dL 7.2   6.6   Total Bilirubin 0.3 - 1.2 mg/dL 0.8   0.7   Alkaline Phos 38 - 126 U/L 70   66   AST 15 - 41 U/L 39   24   ALT 0 - 44 U/L 25   15      ESR 30 CRP 0.6 Micro Last culture from 9/5 - wopund positive for MSSA Assessment/Plan: MSSA bacteremia 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 Posterolateral arthrodesis L1 to L4 3. Posterior segmental instrumentation L1 to L4  4.  Lumbar decompression with total facetectomy on the right at L2-3 5.  Allograft placement in the 2 3 disc space for arthrodesis  Culture positive for MSSA On cefazolin ( was on nafcillin before)  and rifampin Has completed 4 weeks of IV after 2 nd surgery- plan IV until 10/17 Will check ESR/CRP May consider long acting dalbavancin   and PO antibiotics After IV he will need another 4-6 weeks of PO   Watch LFTS/ CBC closely while on rifampin because eof hepc and possible liver cirrhosis Low  threshold to stop rifampin  TEE not needed as it will not change management   B/l psoas abscesses ,     Chronic Hepatitis C- has not ben treated- will need management as OP VL 705K   Splenomegaly likely due to portal hypertension likely cirrhosis pancytopenia   IVDA- cannot be sent home with PICC line and IV antibiotic    Discussed the management with the patient

## 2023-05-08 NOTE — Plan of Care (Signed)
  Problem: Clinical Measurements: Goal: Will remain free from infection Outcome: Progressing   Problem: Activity: Goal: Risk for activity intolerance will decrease Outcome: Progressing   Problem: Nutrition: Goal: Adequate nutrition will be maintained Outcome: Progressing   Problem: Pain Managment: Goal: General experience of comfort will improve Outcome: Progressing   

## 2023-05-09 DIAGNOSIS — G061 Intraspinal abscess and granuloma: Secondary | ICD-10-CM | POA: Diagnosis not present

## 2023-05-09 LAB — C-REACTIVE PROTEIN: CRP: 1 mg/dL — ABNORMAL HIGH (ref <1.0)

## 2023-05-09 LAB — SEDIMENTATION RATE: Sed Rate: 22 mm/h — ABNORMAL HIGH (ref 0–15)

## 2023-05-09 NOTE — Plan of Care (Signed)
  Problem: Education: Goal: Knowledge of General Education information will improve Description: Including pain rating scale, medication(s)/side effects and non-pharmacologic comfort measures Outcome: Progressing   Problem: Health Behavior/Discharge Planning: Goal: Ability to manage health-related needs will improve Outcome: Progressing   Problem: Clinical Measurements: Goal: Ability to maintain clinical measurements within normal limits will improve Outcome: Progressing Goal: Diagnostic test results will improve Outcome: Progressing   Problem: Nutrition: Goal: Adequate nutrition will be maintained Outcome: Progressing   Problem: Elimination: Goal: Will not experience complications related to bowel motility Outcome: Progressing   Problem: Pain Managment: Goal: General experience of comfort will improve Outcome: Progressing

## 2023-05-09 NOTE — Progress Notes (Signed)
[facility]  - [place] at [facility]   PATIENT NAME: Adam Cabrera    MR#:  [id number]  DATE OF BIRTH:  10-14-1985  SUBJECTIVE:  No new complaint.  Back pain well controlled.  VITALS:  Blood pressure 112/66, pulse 80, temperature 97.7 F (36.5 C), resp. rate 18, height 6\' 2"  (1.88 m), weight 120.6 kg, SpO2 100%.  PHYSICAL EXAMINATION:   Constitutional: NAD, AAOx3 HEENT: conjunctivae and lids normal, EOMI CV: No cyanosis.   RESP: normal respiratory effort, on RA Neuro: II - XII grossly intact.   Psych: Normal mood and affect.  Appropriate judgement and reason  LABORATORY PANEL:  CBC Recent Labs  Lab 05/08/23 0300  WBC 3.6*  HGB 11.1*  HCT 34.7*  PLT 116*    Chemistries  Recent Labs  Lab 05/06/23 0417 05/08/23 0300  NA 135  --   K 3.7  --   CL 105  --   CO2 25  --   GLUCOSE 102*  --   BUN 11  --   CREATININE 0.51*  --   CALCIUM 8.4*  --   MG 1.8 1.8  AST  --  39  ALT  --  25  ALKPHOS  --  70  BILITOT  --  0.8    Assessment and Plan  37 y.o. male with medical history significant for hypertension, scrotal cellulitis, history of IV drug use, presents with low back pain.  MRI of the lumbar spine showed Epidural extension with epidural phlegmon/abscess within the ventral epidural space extending from L1-2 through L3-4.  Blood culture positive for Staph aureus, patient treated with cefepime and vancomycin.  Patient has been seen by ID and neurosurgery, L spine washout performed 8/19.   Patient had a worsening back pain since 9/3, Patient was brought to the OR again 9/5. Had: 1. Transforaminal Lumbar Interbody Fusion L2-3.  2. Posterolateral arthrodesis L1 to L4 3. Posterior segmental instrumentation L1 to L4; 4.  Lumbar decompression with total facetectomy on the right at L2-3;  5.  Allograft placement in the 2 3 disc space for arthrodesis.   One of the wound cultures from 9/5 growing rare Staph aureus.   Epidural abscess, L2-L5 Osteomyelitis  (HCC) L2-3 Status post neurosurgical procedure for L2-L3 decompression for epidural phlegmon on 8/19.  On 9/5, patient had a another neurosurgical procedure which included transforaminal lumbar interbody fusion of L2/L3, posterolateral arthrodesis L1-L4, posterior segment instrumentation L1-L4, lumbar decompression with total fasciotomy on the right at L2-L3, allograft placement in the 2/3 disc space for arthrodesis.   --ID recommends 6 weeks total of IV antibiotics.  Since one of the cultures from 9/5 growing Staph aureus may have to be 6 weeks of antibiotics from that culture.  Last dose will be on 05/22/2023.  Normal LFTs on 9/29-checked considering him being on rifampin - New midline placed on 9/27  MSSA (methicillin susceptible Staphylococcus aureus) septicemia (HCC) --Present on admission with tachycardia, tachypnea and epidural abscess. --  Continue continuous IV cefazolin and rifampin--till 05/22/2023   Psoas abscess, left (HCC) --Continue abxs as above   Thrombocytopenia (HCC) -- Hepatitis C viral load positive, hepatitis A negative, hepatitis B surface antigen negative -plt trending down.  129>107.  Will hold Lovenox for now -Continue SCDs   BRBPR --Anusol suppository as needed--improved   Obesity (BMI 30-39.9) BMI 32.92   Hypokalemia Pharmacy consult for electrolyte management   Iron deficiency anemia --Last hemoglobin 10.1 --Combination of iron deficiency anemia and postoperative anemia.   -Continue  oral iron.  -- Likely hemorrhoids also contributing to anemia.  Anusol suppository as needed.     Back pain Continue MS Contin 15 mg twice a day.  Continue as needed oxycodone.  --9/19-- discussed with patient regarding pain meds MS Contin 15 mg BID and oxycodone 10 mg Q6 PRN. IV Dilaudid discontinued. Patient agreeable. Pain manageable for now  CODE STATUS: full code DVT Prophylaxis : SCDs Level of care: Med-Surg Status is: Inpatient Remains inpatient appropriate  because: Needs to complete IV abxs in the hospital until 05/22/2023    TOTAL TIME TAKING CARE OF THIS PATIENT: 25 minutes.  >50% time spent on counselling and coordination of care   Darlin Priestly M.D    [facility]   CC: Primary care physician; Patient, No Pcp Per

## 2023-05-09 NOTE — Progress Notes (Signed)
    Date of Admission:  03/23/2023    Pt doing better Walking around  O/e looks well Chest CTA HS s1s2 Abd soft Edema legs  Labs    Latest Ref Rng & Units 05/08/2023    3:00 AM 05/07/2023    5:38 AM 05/05/2023    7:21 AM  CBC  WBC 4.0 - 10.5 K/uL 3.6  3.4  3.7   Hemoglobin 13.0 - 17.0 g/dL 16.1  09.6  04.5   Hematocrit 39.0 - 52.0 % 34.7  33.2  36.5   Platelets 150 - 400 K/uL 116  110  124        Latest Ref Rng & Units 05/08/2023    3:00 AM 05/06/2023    4:17 AM 05/04/2023    3:19 AM  CMP  Glucose 70 - 99 mg/dL  409    BUN 6 - 20 mg/dL  11    Creatinine 8.11 - 1.24 mg/dL  9.14    Sodium 782 - 956 mmol/L  135    Potassium 3.5 - 5.1 mmol/L  3.7    Chloride 98 - 111 mmol/L  105    CO2 22 - 32 mmol/L  25    Calcium 8.9 - 10.3 mg/dL  8.4    Total Protein 6.5 - 8.1 g/dL 7.2   6.6   Total Bilirubin 0.3 - 1.2 mg/dL 0.8   0.7   Alkaline Phos 38 - 126 U/L 70   66   AST 15 - 41 U/L 39   24   ALT 0 - 44 U/L 25   15      ESR 30 CRP 0.6 Micro Last culture from 9/5 - wopund positive for MSSA Assessment/Plan: MSSA bacteremia 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 Posterolateral arthrodesis L1 to L4 3. Posterior segmental instrumentation L1 to L4  4.  Lumbar decompression with total facetectomy on the right at L2-3 5.  Allograft placement in the 2 3 disc space for arthrodesis  Culture positive for MSSA On cefazolin ( was on nafcillin before)  and rifampin Has completed 4 weeks of IV after 2 nd surgery- plan IV until 10/17  ESR improving CRP fine  Will discuss on Monday with neurosurgery regarding his    Watch LFTS/ CBC closely while on rifampin because eof hepc and possible liver cirrhosis Low threshold to stop rifampin  TEE not needed as  it will not change management   B/l psoas abscesses ,     Chronic Hepatitis C- has not ben treated- will need management as OP VL 705K   Splenomegaly likely due to portal hypertension 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

## 2023-05-10 DIAGNOSIS — G061 Intraspinal abscess and granuloma: Secondary | ICD-10-CM | POA: Diagnosis not present

## 2023-05-10 NOTE — Progress Notes (Signed)
Mobility Specialist - Progress Note   05/10/23 1248  Mobility  Activity Ambulated independently in hallway  Level of Assistance Independent  Assistive Device None  Distance Ambulated (ft) 1000 ft  Activity Response Tolerated well  Mobility Referral Yes  $Mobility charge 1 Mobility  Mobility Specialist Start Time (ACUTE ONLY) 1121  Mobility Specialist Stop Time (ACUTE ONLY) 1144  Mobility Specialist Time Calculation (min) (ACUTE ONLY) 23 min   Pt sitting in recliner on RA upon arrival. Pt dons TLSO brace, STS and ambulates to/from [facility] indep with no LOB noted. Pt returns to recliner with needs in reach.  Terrilyn Saver  Mobility Specialist  05/10/23 12:49 PM

## 2023-05-10 NOTE — Progress Notes (Signed)
[facility]  - [place] at [facility]   PATIENT NAME: Adam Cabrera    MR#:  [id number]  DATE OF BIRTH:  Dec 24, 1985  SUBJECTIVE:  Normal oral intake.  Has been walking  VITALS:  Blood pressure 118/67, pulse 85, temperature 98.2 F (36.8 C), resp. rate 19, height 6\' 2"  (1.88 m), weight 120.5 kg, SpO2 99%.  PHYSICAL EXAMINATION:   Constitutional: NAD, AAOx3 HEENT: conjunctivae and lids normal, EOMI CV: No cyanosis.   RESP: normal respiratory effort, on RA Neuro: II - XII grossly intact.   Psych: Normal mood and affect.  Appropriate judgement and reason   LABORATORY PANEL:  CBC Recent Labs  Lab 05/08/23 0300  WBC 3.6*  HGB 11.1*  HCT 34.7*  PLT 116*    Chemistries  Recent Labs  Lab 05/06/23 0417 05/08/23 0300  NA 135  --   K 3.7  --   CL 105  --   CO2 25  --   GLUCOSE 102*  --   BUN 11  --   CREATININE 0.51*  --   CALCIUM 8.4*  --   MG 1.8 1.8  AST  --  39  ALT  --  25  ALKPHOS  --  70  BILITOT  --  0.8    Assessment and Plan  37 y.o. male with medical history significant for hypertension, scrotal cellulitis, history of IV drug use, presents with low back pain.  MRI of the lumbar spine showed Epidural extension with epidural phlegmon/abscess within the ventral epidural space extending from L1-2 through L3-4.  Blood culture positive for Staph aureus, patient treated with cefepime and vancomycin.  Patient has been seen by ID and neurosurgery, L spine washout performed 8/19.   Patient had a worsening back pain since 9/3, Patient was brought to the OR again 9/5. Had: 1. Transforaminal Lumbar Interbody Fusion L2-3.  2. Posterolateral arthrodesis L1 to L4 3. Posterior segmental instrumentation L1 to L4; 4.  Lumbar decompression with total facetectomy on the right at L2-3;  5.  Allograft placement in the 2 3 disc space for arthrodesis.   One of the wound cultures from 9/5 growing rare Staph aureus.   Epidural abscess, L2-L5 Osteomyelitis (HCC)  L2-3 Status post neurosurgical procedure for L2-L3 decompression for epidural phlegmon on 8/19.  On 9/5, patient had a another neurosurgical procedure which included transforaminal lumbar interbody fusion of L2/L3, posterolateral arthrodesis L1-L4, posterior segment instrumentation L1-L4, lumbar decompression with total fasciotomy on the right at L2-L3, allograft placement in the 2/3 disc space for arthrodesis.   --ID recommends 6 weeks total of IV antibiotics.  Since one of the cultures from 9/5 growing Staph aureus may have to be 6 weeks of antibiotics from that culture.  Last dose will be on 05/22/2023.  Normal LFTs on 9/29-checked considering him being on rifampin - New midline placed on 9/27  MSSA (methicillin susceptible Staphylococcus aureus) septicemia (HCC) --Present on admission with tachycardia, tachypnea and epidural abscess. --  Continue continuous IV cefazolin and rifampin--till 05/22/2023   Psoas abscess, left (HCC) --Continue abxs as above   Thrombocytopenia (HCC) -- Hepatitis C viral load positive, hepatitis A negative, hepatitis B surface antigen negative -plt trending down.  129>107.  Will hold Lovenox for now -Continue SCDs   BRBPR --Anusol suppository as needed--improved   Obesity (BMI 30-39.9) BMI 32.92   Hypokalemia Pharmacy consult for electrolyte management   Iron deficiency anemia --Last hemoglobin 10.1 --Combination of iron deficiency anemia and postoperative anemia.   -Continue  oral iron.  -- Likely hemorrhoids also contributing to anemia.  Anusol suppository as needed.     Back pain Continue MS Contin 15 mg twice a day.  Continue as needed oxycodone.  --9/19-- discussed with patient regarding pain meds MS Contin 15 mg BID and oxycodone 10 mg Q6 PRN. IV Dilaudid discontinued. Patient agreeable. Pain manageable for now  CODE STATUS: full code DVT Prophylaxis : SCDs Level of care: Med-Surg Status is: Inpatient Remains inpatient appropriate because:  Needs to complete IV abxs in the hospital until 05/22/2023    TOTAL TIME TAKING CARE OF THIS PATIENT: 25 minutes.  >50% time spent on counselling and coordination of care   Darlin Priestly M.D    [facility]   CC: Primary care physician; Patient, No Pcp Per

## 2023-05-10 NOTE — Plan of Care (Signed)
  Problem: Education: Goal: Knowledge of General Education information will improve Description: Including pain rating scale, medication(s)/side effects and non-pharmacologic comfort measures Outcome: Progressing   Problem: Health Behavior/Discharge Planning: Goal: Ability to manage health-related needs will improve Outcome: Progressing   Problem: Clinical Measurements: Goal: Ability to maintain clinical measurements within normal limits will improve Outcome: Progressing Goal: Will remain free from infection Outcome: Progressing Goal: Diagnostic test results will improve Outcome: Progressing Goal: Cardiovascular complication will be avoided Outcome: Progressing   Problem: Activity: Goal: Risk for activity intolerance will decrease Outcome: Progressing   Problem: Nutrition: Goal: Adequate nutrition will be maintained Outcome: Progressing   Problem: Coping: Goal: Level of anxiety will decrease Outcome: Progressing   Problem: Elimination: Goal: Will not experience complications related to bowel motility Outcome: Progressing   Problem: Pain Managment: Goal: General experience of comfort will improve Outcome: Progressing   Problem: Skin Integrity: Goal: Risk for impaired skin integrity will decrease Outcome: Progressing   Problem: Education: Goal: Knowledge of General Education information will improve Description: Including pain rating scale, medication(s)/side effects and non-pharmacologic comfort measures Outcome: Progressing   Problem: Health Behavior/Discharge Planning: Goal: Ability to manage health-related needs will improve Outcome: Progressing   Problem: Activity: Goal: Risk for activity intolerance will decrease Outcome: Progressing

## 2023-05-10 NOTE — Plan of Care (Signed)

## 2023-05-11 DIAGNOSIS — G061 Intraspinal abscess and granuloma: Secondary | ICD-10-CM | POA: Diagnosis not present

## 2023-05-11 NOTE — Plan of Care (Signed)
  Problem: Education: Goal: Knowledge of General Education information will improve Description: Including pain rating scale, medication(s)/side effects and non-pharmacologic comfort measures Outcome: Progressing   Problem: Clinical Measurements: Goal: Ability to maintain clinical measurements within normal limits will improve Outcome: Progressing   Problem: Activity: Goal: Risk for activity intolerance will decrease Outcome: Progressing   Problem: Nutrition: Goal: Adequate nutrition will be maintained Outcome: Progressing   Problem: Activity: Goal: Risk for activity intolerance will decrease Outcome: Progressing

## 2023-05-11 NOTE — Progress Notes (Signed)
[facility]  - [place] at [facility]   PATIENT NAME: Adam Cabrera    MR#:  [id number]  DATE OF BIRTH:  03/02/1986  SUBJECTIVE:  No new issues VITALS:  Blood pressure 118/63, pulse 66, temperature 97.8 F (36.6 C), resp. rate 14, height 6\' 2"  (1.88 m), weight 120.5 kg, SpO2 100%.  PHYSICAL EXAMINATION:   GENERAL:  37 y.o.-year-old patient with no acute distress.  NEUROLOGIC: nonfocal  patient is alert and awake SKIN: tattoo + Heart regular rate and rhythm LABORATORY PANEL:  CBC Recent Labs  Lab 05/08/23 0300  WBC 3.6*  HGB 11.1*  HCT 34.7*  PLT 116*    Chemistries  Recent Labs  Lab 05/06/23 0417 05/08/23 0300  NA 135  --   K 3.7  --   CL 105  --   CO2 25  --   GLUCOSE 102*  --   BUN 11  --   CREATININE 0.51*  --   CALCIUM 8.4*  --   MG 1.8 1.8  AST  --  39  ALT  --  25  ALKPHOS  --  70  BILITOT  --  0.8    Assessment and Plan  37 y.o. male with medical history significant for hypertension, scrotal cellulitis, history of IV drug use, presents with low back pain.  MRI of the lumbar spine showed Epidural extension with epidural phlegmon/abscess within the ventral epidural space extending from L1-2 through L3-4.  Blood culture positive for Staph aureus, patient treated with cefepime and vancomycin.  Patient has been seen by ID and neurosurgery, L spine washout performed 8/19.   Patient had a worsening back pain since 9/3, Patient was brought to the OR again 9/5. Had: 1. Transforaminal Lumbar Interbody Fusion L2-3.  2. Posterolateral arthrodesis L1 to L4 3. Posterior segmental instrumentation L1 to L4; 4.  Lumbar decompression with total facetectomy on the right at L2-3;  5.  Allograft placement in the 2 3 disc space for arthrodesis.   One of the wound cultures from 9/5 growing rare Staph aureus.   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  On 9/5,  patient had a another neurosurgical procedure which included transforaminal lumbar interbody fusion of L2/L3, posterolateral arthrodesis L1-L4, posterior segment instrumentation L1-L4, lumbar decompression with total fasciotomy on the right at L2-L3, allograft placement in the 2/3 disc space for arthrodesis.   --ID recommends 6 weeks total of IV antibiotics.  Since one of the cultures from 9/5 growing Staph aureus may have to be 6 weeks of antibiotics from that culture.  Last dose will be on 05/22/2023.  Normal LFTs on 9/29-checked considering him being on rifampin - New midline placed on 9/27  MSSA (methicillin susceptible Staphylococcus aureus) septicemia (HCC) --Present on admission with tachycardia, tachypnea and epidural abscess. --  Continue continuous IV cefazolin and rifampin--till 05/22/2023   Psoas abscess, left (HCC) --Continue abxs as above   Thrombocytopenia (HCC) -- Hepatitis C viral load positive, hepatitis A negative, hepatitis B surface antigen negative -plt trending down.  129>107.  Will hold Lovenox for now -Continue SCDs   BRBPR --Anusol suppository as needed--improved   Obesity (BMI 30-39.9) BMI 32.92   Hypokalemia Pharmacy consult for electrolyte management   Iron deficiency anemia --Last hemoglobin 10.1 --Combination of iron deficiency anemia and postoperative anemia.   -Continue oral iron.  -- Likely hemorrhoids also contributing to anemia.  Anusol suppository as needed.  Back pain Continue MS Contin 15 mg twice a day.  Continue as needed oxycodone.  --9/19-- discussed with patient regarding pain meds MS Contin 15 mg BID and oxycodone 10 mg Q6 PRN. Patient agreeable. Pain manageable for now  CODE STATUS: full code DVT Prophylaxis : SCDs Level of care: Med-Surg Status is: Inpatient Remains inpatient appropriate because: Needs to complete IV abxs in the hospital until 05/22/2023    TOTAL TIME TAKING CARE OF THIS PATIENT: 15 minutes.  >50% time  spent on counselling and coordination of care  Note: This dictation was prepared with Dragon dictation along with smaller phrase technology. Any transcriptional errors that result from this process are unintentional.  Enedina Finner M.D    [facility]   CC: Primary care physician; Patient, No Pcp Per

## 2023-05-12 DIAGNOSIS — G061 Intraspinal abscess and granuloma: Secondary | ICD-10-CM | POA: Diagnosis not present

## 2023-05-12 NOTE — Plan of Care (Signed)
  Problem: Education: Goal: Knowledge of General Education information will improve Description: Including pain rating scale, medication(s)/side effects and non-pharmacologic comfort measures Outcome: Progressing   Problem: Health Behavior/Discharge Planning: Goal: Ability to manage health-related needs will improve Outcome: Progressing   Problem: Clinical Measurements: Goal: Ability to maintain clinical measurements within normal limits will improve Outcome: Progressing Goal: Cardiovascular complication will be avoided Outcome: Progressing   Problem: Activity: Goal: Risk for activity intolerance will decrease Outcome: Progressing   Problem: Nutrition: Goal: Adequate nutrition will be maintained Outcome: Progressing   Problem: Elimination: Goal: Will not experience complications related to bowel motility Outcome: Progressing   Problem: Clinical Measurements: Goal: Will remain free from infection Outcome: Not Progressing   Problem: Pain Managment: Goal: General experience of comfort will improve Outcome: Not Progressing

## 2023-05-12 NOTE — Progress Notes (Signed)
    Date of Admission:  03/23/2023    Has many question about when he can return to work Transportation to medical visits  O/e looks well Chest CTA HS s1s2 Abd soft   Labs    Latest Ref Rng & Units 05/08/2023    3:00 AM 05/07/2023    5:38 AM 05/05/2023    7:21 AM  CBC  WBC 4.0 - 10.5 K/uL 3.6  3.4  3.7   Hemoglobin 13.0 - 17.0 g/dL 40.9  81.1  91.4   Hematocrit 39.0 - 52.0 % 34.7  33.2  36.5   Platelets 150 - 400 K/uL 116  110  124        Latest Ref Rng & Units 05/08/2023    3:00 AM 05/06/2023    4:17 AM 05/04/2023    3:19 AM  CMP  Glucose 70 - 99 mg/dL  782    BUN 6 - 20 mg/dL  11    Creatinine 9.56 - 1.24 mg/dL  2.13    Sodium 086 - 578 mmol/L  135    Potassium 3.5 - 5.1 mmol/L  3.7    Chloride 98 - 111 mmol/L  105    CO2 22 - 32 mmol/L  25    Calcium 8.9 - 10.3 mg/dL  8.4    Total Protein 6.5 - 8.1 g/dL 7.2   6.6   Total Bilirubin 0.3 - 1.2 mg/dL 0.8   0.7   Alkaline Phos 38 - 126 U/L 70   66   AST 15 - 41 U/L 39   24   ALT 0 - 44 U/L 25   15      ESR 22 CRP 1 Micro Last culture from 9/5 - wopund positive for MSSA Assessment/Plan: MSSA bacteremia 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 Posterolateral arthrodesis L1 to L4 3. Posterior segmental instrumentation L1 to L4  4.  Lumbar decompression with total facetectomy on the right at L2-3 5.  Allograft placement in the 2 3 disc space for arthrodesis  Culture positive for MSSA On cefazolin ( was on nafcillin before)  and rifampin Has completed 32 days of IV after 2 nd surgery-  ESR improving almost normal CRP fine Plan was to give IV until 10/17- now we can do long acting dalbavancin and PO Keflex 1 gram Q6 and PO rifampin 300mg  BID until 05/21/21 following which he will be on PO  keflex for another 6 weeks Follow up as OP    Watch LFTS/ CBC closely while on rifampin because of hepc and possible liver cirrhosis Low threshold to stop rifampin  TEE not needed as it will not change management   B/l psoas abscesses ,     Chronic Hepatitis C- has not ben treated- will need management as OP VL 705K   Splenomegaly likely due to portal hypertension likely cirrhosis pancytopenia   IVDA- cannot be sent home with PICC line and IV antibiotic    Discussed the management with the patient and care team

## 2023-05-12 NOTE — Progress Notes (Signed)
[facility]  - [place] at [facility]   PATIENT NAME: Adam Cabrera    MR#:  [id number]  DATE OF BIRTH:  Oct 21, 1985  SUBJECTIVE:  No new issues D/w pt regarding changing of abxs per ID recs. Questions answered VITALS:  Blood pressure 131/77, pulse 85, temperature 97.8 F (36.6 C), resp. rate 19, height 6\' 2"  (1.88 m), weight 120.3 kg, SpO2 99%.  PHYSICAL EXAMINATION:   GENERAL:  37 y.o.-year-old patient with no acute distress.  NEUROLOGIC: nonfocal  patient is alert and awake SKIN: tattoo + Heart regular rate and rhythm LABORATORY PANEL:  CBC Recent Labs  Lab 05/08/23 0300  WBC 3.6*  HGB 11.1*  HCT 34.7*  PLT 116*    Chemistries  Recent Labs  Lab 05/06/23 0417 05/08/23 0300  NA 135  --   K 3.7  --   CL 105  --   CO2 25  --   GLUCOSE 102*  --   BUN 11  --   CREATININE 0.51*  --   CALCIUM 8.4*  --   MG 1.8 1.8  AST  --  39  ALT  --  25  ALKPHOS  --  70  BILITOT  --  0.8    Assessment and Plan  37 y.o. male with medical history significant for hypertension, scrotal cellulitis, history of IV drug use, presents with low back pain.  MRI of the lumbar spine showed Epidural extension with epidural phlegmon/abscess within the ventral epidural space extending from L1-2 through L3-4.  Blood culture positive for Staph aureus, patient treated with cefepime and vancomycin.  Patient has been seen by ID and neurosurgery, L spine washout performed 8/19.   Patient had a worsening back pain since 9/3, Patient was brought to the OR again 9/5. Had: 1. Transforaminal Lumbar Interbody Fusion L2-3.  2. Posterolateral arthrodesis L1 to L4 3. Posterior segmental instrumentation L1 to L4; 4.  Lumbar decompression with total facetectomy on the right at L2-3;  5.  Allograft placement in the 2 3 disc space for arthrodesis.   One of the wound cultures from 9/5 growing rare Staph aureus.   Epidural abscess, L2-L5 Osteomyelitis (HCC) L2-3 Status post neurosurgical procedure  for L2-L3 decompression for epidural phlegmon on 8/19.  On 9/5, patient had a another neurosurgical procedure which included transforaminal lumbar interbody fusion of L2/L3, posterolateral arthrodesis L1-L4, posterior segment instrumentation L1-L4, lumbar decompression with total fasciotomy on the right at L2-L3, allograft placement in the 2/3 disc space for arthrodesis.   --ID recommends 6 weeks total of IV antibiotics.  Since one of the cultures from 9/5 growing Staph aureus may have to be 6 weeks of antibiotics from that culture.  Last dose will be on 05/22/2023.  Normal LFTs on 9/29-checked considering him being on rifampin - New midline placed on 9/27 --Dr Feliberto Harts and po keflex + rifampin--dr Ernestine Mcmurray agrees with plan --pt has been given heads up with the new abx plan  MSSA (methicillin susceptible Staphylococcus aureus) septicemia (HCC) --Present on admission with tachycardia, tachypnea and epidural abscess. --  Continue continuous IV cefazolin and rifampin--till 05/22/2023   Psoas abscess, left (HCC) --Continue abxs as above   Thrombocytopenia (HCC) -- Hepatitis C viral load positive, hepatitis A negative, hepatitis B surface antigen negative -plt trending down.  129>107.  Will hold Lovenox for now -Continue SCDs   BRBPR --Anusol suppository as needed--improved   Obesity (BMI 30-39.9) BMI 32.92   Hypokalemia Pharmacy consult for electrolyte management  Iron deficiency anemia --Last hemoglobin 10.1 --Combination of iron deficiency anemia and postoperative anemia.   -Continue oral iron.  -- Likely hemorrhoids also contributing to anemia.  Anusol suppository as needed.     Back pain Continue MS Contin 15 mg twice a day.  Continue as needed oxycodone.  --9/19-- discussed with patient regarding pain meds MS Contin 15 mg BID and oxycodone 10 mg Q6 PRN. Patient agreeable. Pain manageable for now  CODE STATUS: full code DVT Prophylaxis : SCDs Level of care:  Med-Surg Status is: Inpatient Remains inpatient appropriate because: Needs to complete IV abxs in the hospital until 05/22/2023    TOTAL TIME TAKING CARE OF THIS PATIENT: 15 minutes.  >50% time spent on counselling and coordination of care  Note: This dictation was prepared with Dragon dictation along with smaller phrase technology. Any transcriptional errors that result from this process are unintentional.  Enedina Finner M.D    [facility]   CC: Primary care physician; Patient, No Pcp Per

## 2023-05-12 NOTE — Plan of Care (Signed)
  Problem: Education: Goal: Knowledge of General Education information will improve Description: Including pain rating scale, medication(s)/side effects and non-pharmacologic comfort measures Outcome: Progressing   Problem: Health Behavior/Discharge Planning: Goal: Ability to manage health-related needs will improve Outcome: Progressing   Problem: Clinical Measurements: Goal: Ability to maintain clinical measurements within normal limits will improve Outcome: Progressing Goal: Will remain free from infection Outcome: Progressing Goal: Diagnostic test results will improve Outcome: Progressing Goal: Cardiovascular complication will be avoided Outcome: Progressing   Problem: Activity: Goal: Risk for activity intolerance will decrease Outcome: Progressing   Problem: Nutrition: Goal: Adequate nutrition will be maintained Outcome: Progressing   Problem: Coping: Goal: Level of anxiety will decrease Outcome: Progressing   Problem: Elimination: Goal: Will not experience complications related to bowel motility Outcome: Progressing   Problem: Pain Managment: Goal: General experience of comfort will improve Outcome: Progressing   Problem: Skin Integrity: Goal: Risk for impaired skin integrity will decrease Outcome: Progressing   Problem: Education: Goal: Knowledge of General Education information will improve Description: Including pain rating scale, medication(s)/side effects and non-pharmacologic comfort measures Outcome: Progressing   Problem: Health Behavior/Discharge Planning: Goal: Ability to manage health-related needs will improve Outcome: Progressing   Problem: Activity: Goal: Risk for activity intolerance will decrease Outcome: Progressing

## 2023-05-13 ENCOUNTER — Other Ambulatory Visit: Payer: Self-pay | Admitting: Infectious Diseases

## 2023-05-13 ENCOUNTER — Other Ambulatory Visit: Payer: Self-pay

## 2023-05-13 DIAGNOSIS — M4646 Discitis, unspecified, lumbar region: Secondary | ICD-10-CM

## 2023-05-13 DIAGNOSIS — G061 Intraspinal abscess and granuloma: Secondary | ICD-10-CM | POA: Diagnosis not present

## 2023-05-13 LAB — MAGNESIUM: Magnesium: 1.8 mg/dL (ref 1.7–2.4)

## 2023-05-13 LAB — COMPREHENSIVE METABOLIC PANEL
ALT: 44 U/L (ref 0–44)
AST: 53 U/L — ABNORMAL HIGH (ref 15–41)
Albumin: 3.5 g/dL (ref 3.5–5.0)
Alkaline Phosphatase: 56 U/L (ref 38–126)
Anion gap: 5 (ref 5–15)
BUN: 13 mg/dL (ref 6–20)
CO2: 28 mmol/L (ref 22–32)
Calcium: 8.3 mg/dL — ABNORMAL LOW (ref 8.9–10.3)
Chloride: 104 mmol/L (ref 98–111)
Creatinine, Ser: 0.51 mg/dL — ABNORMAL LOW (ref 0.61–1.24)
GFR, Estimated: >60 mL/min (ref >60)
Glucose, Bld: 92 mg/dL (ref 70–99)
Potassium: 3.6 mmol/L (ref 3.5–5.1)
Sodium: 137 mmol/L (ref 135–145)
Total Bilirubin: 0.8 mg/dL (ref 0.3–1.2)
Total Protein: 7 g/dL (ref 6.5–8.1)

## 2023-05-13 MED ORDER — OXYCODONE HCL 10 MG PO TABS
10.0000 mg | ORAL_TABLET | Freq: Three times a day (TID) | ORAL | 0 refills | Status: DC | PRN
  Filled 2023-05-13: qty 15, 5d supply, fill #0

## 2023-05-13 MED ORDER — CEPHALEXIN 500 MG PO CAPS
1000.0000 mg | ORAL_CAPSULE | Freq: Four times a day (QID) | ORAL | 0 refills | Status: AC
  Filled 2023-05-13: qty 168, 21d supply, fill #0

## 2023-05-13 MED ORDER — DEXTROSE 5 % IV SOLN
1500.0000 mg | Freq: Once | INTRAVENOUS | Status: AC
  Administered 2023-05-13: 1500 mg via INTRAVENOUS
  Filled 2023-05-13: qty 75

## 2023-05-13 MED ORDER — MAGNESIUM SULFATE 2 GM/50ML IV SOLN: 2.0000 g | Freq: Once | INTRAVENOUS | Status: DC

## 2023-05-13 MED ORDER — CEPHALEXIN 500 MG PO CAPS
1000.0000 mg | ORAL_CAPSULE | Freq: Four times a day (QID) | ORAL | Status: DC
  Administered 2023-05-13: 1000 mg via ORAL
  Filled 2023-05-13: qty 2

## 2023-05-13 MED ORDER — CEPHALEXIN 500 MG PO CAPS
1000.0000 mg | ORAL_CAPSULE | Freq: Four times a day (QID) | ORAL | 0 refills | Status: AC
  Filled 2023-05-13: qty 216, 27d supply, fill #0

## 2023-05-13 MED ORDER — MORPHINE SULFATE ER 15 MG PO TBCR
15.0000 mg | EXTENDED_RELEASE_TABLET | Freq: Two times a day (BID) | ORAL | 0 refills | Status: DC
  Filled 2023-05-13: qty 20, 10d supply, fill #0

## 2023-05-13 MED ORDER — METHOCARBAMOL 750 MG PO TABS
750.0000 mg | ORAL_TABLET | Freq: Four times a day (QID) | ORAL | 0 refills | Status: DC
  Filled 2023-05-13: qty 15, 4d supply, fill #0

## 2023-05-13 MED ORDER — MAGNESIUM OXIDE -MG SUPPLEMENT 400 (240 MG) MG PO TABS
400.0000 mg | ORAL_TABLET | Freq: Two times a day (BID) | ORAL | 1 refills | Status: DC
  Filled 2023-05-13: qty 60, 30d supply, fill #0

## 2023-05-13 MED ORDER — GABAPENTIN 300 MG PO CAPS
300.0000 mg | ORAL_CAPSULE | Freq: Three times a day (TID) | ORAL | 0 refills | Status: DC
  Filled 2023-05-13: qty 90, 30d supply, fill #0

## 2023-05-13 MED ORDER — FERROUS SULFATE 325 (65 FE) MG PO TABS
325.0000 mg | ORAL_TABLET | Freq: Every day | ORAL | 3 refills | Status: DC
  Filled 2023-05-13: qty 30, 30d supply, fill #0

## 2023-05-13 MED ORDER — RIFAMPIN 300 MG PO CAPS
300.0000 mg | ORAL_CAPSULE | Freq: Two times a day (BID) | ORAL | 0 refills | Status: AC
  Filled 2023-05-13: qty 18, 9d supply, fill #0

## 2023-05-13 NOTE — Discharge Summary (Signed)
Physician Discharge Summary   Patient: Adam Cabrera MRN: [id number] DOB: 06-17-1986  Admit date:     03/23/2023  Discharge date: 05/13/23  Discharge Physician: Enedina Finner   PCP: Patient, No Pcp Per   Recommendations at discharge:   follow-up neurosurgery Dr. Ernestine Mcmurray on your scheduled appointment October 30 follow-up infectious disease Dr. Joylene Draft on your scheduled appointment on October 22 follow-up appointment for IV antibiotic as outpatient schedule by ID. 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 male with medical history significant for hypertension, scrotal cellulitis, history of IV drug use, presents with low back pain.  MRI of the lumbar spine showed Epidural extension with epidural phlegmon/abscess within the ventral epidural space extending from L1-2 through L3-4.  Blood culture positive for Staph aureus, patient treated with cefepime and vancomycin.  Patient has been seen by ID and neurosurgery, L spine washout performed 8/19.    Patient had a worsening back pain since 9/3, Patient was brought to the OR again 9/5. Had: 1. Transforaminal Lumbar Interbody Fusion L2-3.  2. Posterolateral arthrodesis L1 to L4 3. Posterior segmental instrumentation L1 to L4; 4.  Lumbar decompression with total facetectomy on the right at L2-3;  5.  Allograft placement in the 2  3 disc space for arthrodesis.   One of the wound cultures from 9/5 growing rare Staph aureus.   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  On 9/5, patient had a another neurosurgical procedure which included transforaminal lumbar interbody fusion of L2/L3, posterolateral arthrodesis L1-L4, posterior segment instrumentation L1-L4, lumbar decompression with total fasciotomy on the right at L2-L3, allograft placement in the 2/3 disc space for arthrodesis.   --ID recommends 6 weeks total of IV antibiotics.  Since one of the cultures from 9/5 growing Staph aureus may have to be 6 weeks of antibiotics from that culture.  Last dose will be on 05/22/2023.  Normal LFTs on 9/29-checked considering him being on rifampin - New midline placed on 9/27 --Dr Rodrigo Ran Dalbvancin x 2 doses and po keflex + rifampin--dr Ernestine Mcmurray agrees with plan --pt has been updated with the new abx plan --d/c home today   MSSA (methicillin susceptible Staphylococcus aureus) septicemia (HCC) --Present on admission with tachycardia, tachypnea and epidural abscess. --  rx as above  Psoas abscess, left (HCC) --Continue abxs as above   Thrombocytopenia (HCC) -Hepatitis C viral load positive, hepatitis A negative, hepatitis B surface antigen negative -plt trending down.  409>811>914.   -Continue SCDs   BRBPR --Anusol suppository as needed--improved   Obesity (BMI 30-39.9) BMI 32.92   Hypokalemia Pharmacy consult for electrolyte management   Iron deficiency anemia --Last hemoglobin 11.1 --Combination of iron deficiency anemia and postoperative anemia.   --Continue oral iron.  -- Likely hemorrhoids also  contributing to anemia.  Anusol suppository as needed.     Back pain Continue MS Contin 15 mg twice a day.  Continue as needed oxycodone.  --9/19-- discussed with patient regarding pain meds MS Contin 15 mg BID and  oxycodone 10 mg Q6 PRN. Patient agreeable. Pain manageable for now  D/c home   CODE STATUS: full code DVT Prophylaxis : SCDs Level of care: Med-Surg     Pain control - [place] Controlled Substance Reporting System database was reviewed. and patient was instructed, not to drive, operate heavy machinery, perform activities at heights, swimming or participation in water activities or provide baby-sitting services while on Pain, Sleep and Anxiety Medications; until their outpatient Physician has advised to do so again. Also recommended to not to take more than prescribed Pain, Sleep and Anxiety Medications.  Consultants: ID, neurosurgery Disposition: Home Diet recommendation:  Discharge Diet Orders (From admission, onward)     Start     Ordered   05/13/23 0000  Diet - low sodium heart healthy        05/13/23 1219           Regular diet DISCHARGE MEDICATION: Allergies as of 05/13/2023   No Known Allergies      Medication List     STOP taking these medications    cyclobenzaprine 10 MG tablet Commonly known as: FLEXERIL   traMADol 50 MG tablet Commonly known as: ULTRAM       TAKE these medications    cephALEXin 500 MG capsule Commonly known as: KEFLEX Take 2 capsules (1,000 mg total) by mouth 4 (four) times daily for 21 days.   cephALEXin 500 MG capsule Commonly known as: KEFLEX Take 2 capsules (1,000 mg total) by mouth 4 (four) times daily for 27 days. Start taking on: June 04, 2023   ferrous sulfate 325 (65 FE) MG tablet Take 1 tablet (325 mg total) by mouth daily with breakfast. Start taking on: May 14, 2023   gabapentin 300 MG capsule Commonly known as: NEURONTIN Take 1 capsule (300 mg total) by mouth 3 (three) times daily.   magnesium oxide 400 (240 Mg) MG tablet Commonly known as: MAG-OX Take 1 tablet (400 mg total) by mouth 2 (two) times daily.   methocarbamol 750 MG tablet Commonly known as: ROBAXIN Take 1 tablet (750 mg total) by  mouth every 6 (six) hours.   morphine 15 MG 12 hr tablet Commonly known as: MS CONTIN Take 1 tablet (15 mg total) by mouth every 12 (twelve) hours.   Oxycodone HCl 10 MG Tabs Take 1 tablet (10 mg total) by mouth every 8 (eight) hours as needed for moderate pain or severe pain.   rifampin 300 MG capsule Commonly known as: Rifadin Take 1 capsule (300 mg total) by mouth 2 (two) times daily for 9 days.        Follow-up Information     Lovenia Kim, MD Follow up on 06/04/2023.   Specialty: Neurosurgery Why: Follow up with Dr. Katrinka Blazing at 11:30am on 10/30. Please arrive at the [facility] outpatient imaging center by 10:30am for xrays Contact information: [address] [place] [address] [phone]         Lynn Ito, MD. Go on 05/27/2023.   Specialty: Infectious Diseases Why: on your scheduled appt Contact information: [address] [place] [address] [phone]                 Condition at discharge: fair  The results  of significant diagnostics from this hospitalization (including imaging, microbiology, ancillary and laboratory) are listed below for reference.   Imaging Studies: No results found.  Microbiology: Results for orders placed or performed during the hospital encounter of 03/23/23  Culture, blood (routine x 2)     Status: Abnormal   Collection Time: 03/23/23  4:15 PM   Specimen: BLOOD  Result Value Ref Range Status   Specimen Description   Final    BLOOD BLOOD LEFT ARM Performed at [facility], [address] [place], [place] [address]    Special Requests   Final    BOTTLES DRAWN AEROBIC AND ANAEROBIC Blood Culture adequate volume Performed at [facility], [address] [place], [place] [address]    Culture  Setup Time   Final    GRAM POSITIVE COCCI IN BOTH AEROBIC AND ANAEROBIC BOTTLES CRITICAL RESULT CALLED TO, READ BACK BY AND VERIFIED WITH: Marisue Brooklyn 03/24/23 8295  MW Performed at [facility], [address], [place], [place] [address]    Culture STAPHYLOCOCCUS AUREUS (A)  Final   Report Status 03/26/2023 FINAL  Final   Organism ID, Bacteria STAPHYLOCOCCUS AUREUS  Final      Susceptibility   Staphylococcus aureus - MIC*    CIPROFLOXACIN <=0.5 SENSITIVE Sensitive     ERYTHROMYCIN <=0.25 SENSITIVE Sensitive     GENTAMICIN <=0.5 SENSITIVE Sensitive     OXACILLIN 0.5 SENSITIVE Sensitive     TETRACYCLINE <=1 SENSITIVE Sensitive     VANCOMYCIN <=0.5 SENSITIVE Sensitive     TRIMETH/SULFA <=10 SENSITIVE Sensitive     CLINDAMYCIN <=0.25 SENSITIVE Sensitive     RIFAMPIN <=0.5 SENSITIVE Sensitive     Inducible Clindamycin NEGATIVE Sensitive     LINEZOLID 2 SENSITIVE Sensitive     * STAPHYLOCOCCUS AUREUS  Blood Culture ID Panel (Reflexed)     Status: Abnormal   Collection Time: 03/23/23  4:15 PM  Result Value Ref Range Status   Enterococcus faecalis NOT DETECTED NOT DETECTED Final   Enterococcus Faecium NOT DETECTED NOT DETECTED Final   Listeria monocytogenes NOT DETECTED NOT DETECTED Final   Staphylococcus species DETECTED (A) NOT DETECTED Final    Comment: CRITICAL RESULT CALLED TO, READ BACK BY AND VERIFIED WITH: DEVIN MITCHELL 03/24/23 0811 MW    Staphylococcus aureus (BCID) DETECTED (A) NOT DETECTED Final    Comment: CRITICAL RESULT CALLED TO, READ BACK BY AND VERIFIED WITH: DEVIN MITCHELL 03/24/23 0811 MW    Staphylococcus epidermidis NOT DETECTED NOT DETECTED Final   Staphylococcus lugdunensis NOT DETECTED NOT DETECTED Final   Streptococcus species NOT DETECTED NOT DETECTED Final   Streptococcus agalactiae NOT DETECTED NOT DETECTED Final   Streptococcus pneumoniae NOT DETECTED NOT DETECTED Final   Streptococcus pyogenes NOT DETECTED NOT DETECTED Final   A.calcoaceticus-baumannii NOT DETECTED NOT DETECTED Final   Bacteroides fragilis NOT DETECTED NOT DETECTED Final   Enterobacterales NOT DETECTED NOT DETECTED Final   Enterobacter  cloacae complex NOT DETECTED NOT DETECTED Final   Escherichia coli NOT DETECTED NOT DETECTED Final   Klebsiella aerogenes NOT DETECTED NOT DETECTED Final   Klebsiella oxytoca NOT DETECTED NOT DETECTED Final   Klebsiella pneumoniae NOT DETECTED NOT DETECTED Final   Proteus species NOT DETECTED NOT DETECTED Final   Salmonella species NOT DETECTED NOT DETECTED Final   Serratia marcescens NOT DETECTED NOT DETECTED Final   Haemophilus influenzae NOT DETECTED NOT DETECTED Final   Neisseria meningitidis NOT DETECTED NOT DETECTED Final   Pseudomonas aeruginosa NOT DETECTED NOT DETECTED  Final   Stenotrophomonas maltophilia NOT DETECTED NOT DETECTED Final   Candida albicans NOT DETECTED NOT DETECTED Final   Candida auris NOT DETECTED NOT DETECTED Final   Candida glabrata NOT DETECTED NOT DETECTED Final   Candida krusei NOT DETECTED NOT DETECTED Final   Candida parapsilosis NOT DETECTED NOT DETECTED Final   Candida tropicalis NOT DETECTED NOT DETECTED Final   Cryptococcus neoformans/gattii NOT DETECTED NOT DETECTED Final   Meth resistant mecA/C and MREJ NOT DETECTED NOT DETECTED Final    Comment: Performed at [facility], [address] [place], [place] [address]  Culture, blood (routine x 2)     Status: Abnormal   Collection Time: 03/23/23  4:51 PM   Specimen: BLOOD  Result Value Ref Range Status   Specimen Description   Final    BLOOD RIGHT ANTECUBITAL Performed at [facility], [address] [place], [place] [address]    Special Requests   Final    BOTTLES DRAWN AEROBIC AND ANAEROBIC Blood Culture adequate volume Performed at [facility], [address] [place], [place] [address]    Culture  Setup Time   Final    GRAM POSITIVE COCCI ANAEROBIC BOTTLE ONLY CRITICAL RESULT CALLED TO, READ BACK BY AND VERIFIED WITH: [facility] MITCHELL 03/24/23 0811 MW CRITICAL VALUE NOTED.  VALUE IS CONSISTENT WITH PREVIOUSLY REPORTED AND CALLED VALUE.    Culture (A)  Final     STAPHYLOCOCCUS AUREUS SUSCEPTIBILITIES PERFORMED ON PREVIOUS CULTURE WITHIN THE LAST 5 DAYS. Performed at [facility], [address] [place], [place] [address]    Report Status 03/26/2023 FINAL  Final  Surgical PCR screen     Status: Abnormal   Collection Time: 03/24/23  8:37 AM   Specimen: Nasal Mucosa; Nasal Swab  Result Value Ref Range Status   MRSA, PCR NEGATIVE NEGATIVE Final   Staphylococcus aureus POSITIVE (A) NEGATIVE Final    Comment: (NOTE) The Xpert SA Assay (FDA approved for NASAL specimens in patients 74 years of age and older), is one component of a comprehensive surveillance program. It is not intended to diagnose infection nor to guide or monitor treatment. Performed at [facility], [address] [place], [place] [address]   Aerobic/Anaerobic Culture w Gram Stain (surgical/deep wound)     Status: None   Collection Time: 03/24/23  6:30 PM   Specimen: Abscess  Result Value Ref Range Status   Specimen Description ABSCESS  Final   Special Requests INVETREBAL DISC  Final   Gram Stain NO WBC SEEN RARE GRAM POSITIVE COCCI   Final   Culture   Final    ABUNDANT STAPHYLOCOCCUS AUREUS NO ANAEROBES ISOLATED Performed at [facility], [address], [place], [place] [address]    Report Status 03/29/2023 FINAL  Final   Organism ID, Bacteria STAPHYLOCOCCUS AUREUS  Final      Susceptibility   Staphylococcus aureus - MIC*    CIPROFLOXACIN <=0.5 SENSITIVE Sensitive     ERYTHROMYCIN <=0.25 SENSITIVE Sensitive     GENTAMICIN <=0.5 SENSITIVE Sensitive     OXACILLIN 0.5 SENSITIVE Sensitive     TETRACYCLINE <=1 SENSITIVE Sensitive     VANCOMYCIN <=0.5 SENSITIVE Sensitive     TRIMETH/SULFA <=10 SENSITIVE Sensitive     CLINDAMYCIN <=0.25 SENSITIVE Sensitive     RIFAMPIN <=0.5 SENSITIVE Sensitive     Inducible Clindamycin NEGATIVE Sensitive     LINEZOLID 2 SENSITIVE Sensitive     * ABUNDANT STAPHYLOCOCCUS AUREUS  Culture, blood (Routine  X 2) w  Reflex to ID Panel     Status: None   Collection Time: 03/26/23  4:55 AM   Specimen: BLOOD LEFT HAND  Result Value Ref Range Status   Specimen Description BLOOD LEFT HAND  Final   Special Requests   Final    BOTTLES DRAWN AEROBIC AND ANAEROBIC Blood Culture results may not be optimal due to an excessive volume of blood received in culture bottles   Culture   Final    NO GROWTH 5 DAYS Performed at [facility], [address] [place], [place] [address]    Report Status 03/31/2023 FINAL  Final  Culture, blood (Routine X 2) w Reflex to ID Panel     Status: None   Collection Time: 03/26/23  5:03 AM   Specimen: BLOOD RIGHT ARM  Result Value Ref Range Status   Specimen Description BLOOD RIGHT ARM  Final   Special Requests   Final    BOTTLES DRAWN AEROBIC AND ANAEROBIC Blood Culture results may not be optimal due to an excessive volume of blood received in culture bottles   Culture   Final    NO GROWTH 5 DAYS Performed at [facility], [address], [place], [place] [address]    Report Status 03/31/2023 FINAL  Final  Aerobic/Anaerobic Culture w Gram Stain (surgical/deep wound)     Status: None   Collection Time: 04/10/23  3:07 PM   Specimen: Path Tissue  Result Value Ref Range Status   Specimen Description   Final    TISSUE Performed at [facility], [address], [place], [place] [address]    Special Requests   Final    SUBFASCIAL CULTURE Performed at [facility], [address] [place], [place] [address]    Gram Stain NO WBC SEEN NO ORGANISMS SEEN   Final   Culture   Final    No growth aerobically or anaerobically. Performed at [facility], [address], [place], [place] [address]    Report Status 04/16/2023 FINAL  Final  Aerobic/Anaerobic Culture w Gram Stain (surgical/deep wound)     Status: None   Collection Time: 04/10/23  3:14 PM   Specimen: Wound; Tissue  Result Value Ref Range Status   Specimen Description    Final    WOUND Performed at [facility], [address] [place], [place] [address]    Special Requests   Final    SUBFASCIAL TISSUE CULTURE Performed at [facility], [address] [place], [place] [address]    Gram Stain NO WBC SEEN NO ORGANISMS SEEN   Final   Culture   Final    No growth aerobically or anaerobically. Performed at [facility], [address] [place], [place] [address]    Report Status 04/16/2023 FINAL  Final  Aerobic/Anaerobic Culture w Gram Stain (surgical/deep wound)     Status: None   Collection Time: 04/10/23  4:42 PM   Specimen: Wound; Tissue  Result Value Ref Range Status   Specimen Description   Final    WOUND Performed at [facility], [address] [place], [place] [address]    Special Requests   Final    DISC SPACE CULTURE Performed at [facility], [address] [place], [place] [address]    Gram Stain NO WBC SEEN NO ORGANISMS SEEN   Final   Culture   Final    No growth aerobically or anaerobically. Performed at [facility], [address], [place], [place] [address]    Report Status 04/16/2023 FINAL  Final  Aerobic/Anaerobic Culture w Gram Stain (surgical/deep wound)     Status: None   Collection Time: 04/10/23  5:04 PM   Specimen: Path Tissue  Result Value Ref Range Status   Specimen Description   Final    TISSUE Performed at [facility], [address], [place], [place] [address]    Special Requests   Final    DISC SPACE 2 Performed at [facility], [address] [place], [place] [address]    Gram Stain NO WBC SEEN NO ORGANISMS SEEN   Final   Culture   Final    RARE STAPHYLOCOCCUS AUREUS NO ANAEROBES ISOLATED Performed at [facility], [address] [place], [place] [address]    Report Status 04/16/2023 FINAL  Final   Organism ID, Bacteria STAPHYLOCOCCUS AUREUS  Final      Susceptibility   Staphylococcus aureus - MIC*     CIPROFLOXACIN <=0.5 SENSITIVE Sensitive     ERYTHROMYCIN <=0.25 SENSITIVE Sensitive     GENTAMICIN <=0.5 SENSITIVE Sensitive     OXACILLIN 0.5 SENSITIVE Sensitive     TETRACYCLINE <=1 SENSITIVE Sensitive     VANCOMYCIN 1 SENSITIVE Sensitive     TRIMETH/SULFA <=10 SENSITIVE Sensitive     CLINDAMYCIN <=0.25 SENSITIVE Sensitive     RIFAMPIN <=0.5 SENSITIVE Sensitive     Inducible Clindamycin NEGATIVE Sensitive     LINEZOLID 2 SENSITIVE Sensitive     * RARE STAPHYLOCOCCUS AUREUS    Labs: CBC: Recent Labs  Lab 05/07/23 0538 05/08/23 0300  WBC 3.4* 3.6*  HGB 10.6* 11.1*  HCT 33.2* 34.7*  MCV 90.2 89.4  PLT 110* 116*   Basic Metabolic Panel: Recent Labs  Lab 05/08/23 0300 05/13/23 0427  NA  --  137  K  --  3.6  CL  --  104  CO2  --  28  GLUCOSE  --  92  BUN  --  13  CREATININE  --  0.51*  CALCIUM  --  8.3*  MG 1.8 1.8  PHOS 4.8*  --    Liver Function Tests: Recent Labs  Lab 05/08/23 0300 05/13/23 0427  AST 39 53*  ALT 25 44  ALKPHOS 70 56  BILITOT 0.8 0.8  PROT 7.2 7.0  ALBUMIN 3.7 3.5    Discharge time spent: greater than 30 minutes.  Signed: Enedina Finner, MD [facility] 05/13/2023

## 2023-05-13 NOTE — Progress Notes (Signed)
ID Pt waiting to be discharged Doing fine  Patient Vitals for the past 24 hrs:  BP Temp Temp src Pulse Resp SpO2 Weight  05/13/23 1517 123/77 98 F (36.7 C) -- 93 18 95 % --  05/13/23 0829 122/71 98.4 F (36.9 C) -- 69 18 100 % --  05/13/23 0500 -- -- -- -- -- -- 121.7 kg  05/12/23 2316 -- -- -- -- 18 -- --  05/12/23 2315 105/68 98.7 F (37.1 C) Oral 80 -- 96 % --    CNS non focal  Labs    Latest Ref Rng & Units 05/08/2023    3:00 AM 05/07/2023    5:38 AM 05/05/2023    7:21 AM  CBC  WBC 4.0 - 10.5 K/uL 3.6  3.4  3.7   Hemoglobin 13.0 - 17.0 g/dL 16.1  09.6  04.5   Hematocrit 39.0 - 52.0 % 34.7  33.2  36.5   Platelets 150 - 400 K/uL 116  110  124       Latest Ref Rng & Units 05/13/2023    4:27 AM 05/08/2023    3:00 AM 05/06/2023    4:17 AM  CMP  Glucose 70 - 99 mg/dL 92   409   BUN 6 - 20 mg/dL 13   11   Creatinine 8.11 - 1.24 mg/dL 9.14   7.82   Sodium 956 - 145 mmol/L 137   135   Potassium 3.5 - 5.1 mmol/L 3.6   3.7   Chloride 98 - 111 mmol/L 104   105   CO2 22 - 32 mmol/L 28   25   Calcium 8.9 - 10.3 mg/dL 8.3   8.4   Total Protein 6.5 - 8.1 g/dL 7.0  7.2    Total Bilirubin 0.3 - 1.2 mg/dL 0.8  0.8    Alkaline Phos 38 - 126 U/L 56  70    AST 15 - 41 U/L 53  39    ALT 0 - 44 U/L 44  25        Impression/recommendation MSSA bacteremia admitted on 03/23/23 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 Posterolateral arthrodesis L1 to L4 3. Posterior segmental instrumentation L1 to L4  4.  Lumbar decompression with total facetectomy on the right at L2-3 5.  Allograft placement in the 2 3 disc space for arthrodesis   Culture positive for MSSA On cefazolin   and rifampin Has completed 33 days of IV after 2 nd surgery-  ESR improving almost  normal CRP fine Plan was to give IV until 10/17- today we gave 1500mg   long acting dalbavancin and then will send on PO Keflex 1 gram Q6 and PO rifampin 300mg  BID until 05/21/21 following which he will be on PO keflex for another 6 weeks. He will get another dose of Dalba 1 gram  next Tuesday. STAT labs to check cr and wbc to be done next Tuesday before giving Dalba Discussed the management in great detail with patient and care team and day surgery Follow up as OP

## 2023-05-13 NOTE — Consult Note (Signed)
PHARMACY CONSULT NOTE - ELECTROLYTES  Pharmacy Consult for Electrolyte Monitoring and Replacement   Recent Labs: Height: 6\' 2"  (188 cm) Weight: 121.7 kg (268 lb 4.8 oz) IBW/kg (Calculated) : 82.2 Estimated Creatinine Clearance: 171.8 mL/min (A) (by C-G formula based on SCr of 0.51 mg/dL (L)). Potassium (mmol/L)  Date Value  05/13/2023 3.6   Magnesium (mg/dL)  Date Value  22/97/9892 1.8   Calcium (mg/dL)  Date Value  11/94/1740 8.3 (L)   Albumin (g/dL)  Date Value  81/44/8185 3.5   Phosphorus (mg/dL)  Date Value  63/14/9702 4.8 (H)   Sodium (mmol/L)  Date Value  05/13/2023 137   Assessment  Adam Cabrera is a 37 y.o. male presenting with osteomyelitis. PMH significant for hypertension, scrotal cellulitis, history of IV drug use. Pharmacy has been consulted to monitor and replace electrolytes.  Diet: Regular  Goal of Therapy: Electrolytes WNL  Plan:  Mg 1.8, give MagSulf 2 grams IV x 1, continue MagOx 400 mg po BID Continue with weekly labs as electrolytes remain relatively stable  Thank you for allowing pharmacy to be a part of this patient's care.  Barrie Folk, PharmD Clinical Pharmacist 05/13/2023 8:14 AM

## 2023-05-13 NOTE — Progress Notes (Signed)
Discharge instructions reviewed with patient including followup visits and new medications.  Understanding was verbalized and all questions were answered.  IV removed without complication; patient tolerated well.  Patient discharged home via wheelchair in stable condition escorted by nursing staff.  

## 2023-05-13 NOTE — Progress Notes (Signed)
Mobility Specialist - Progress Note   05/13/23 1120  Mobility  Activity Ambulated independently in hallway  Level of Assistance Independent  Assistive Device None  Distance Ambulated (ft) 1000 ft  Activity Response Tolerated well  $Mobility charge 1 Mobility  Mobility Specialist Start Time (ACUTE ONLY) 1056  Mobility Specialist Stop Time (ACUTE ONLY) 1105  Mobility Specialist Time Calculation (min) (ACUTE ONLY) 9 min   Pt amb in room upon entry, utilizing RA. Pt agreeable to amb in the hallway this date. Pt amb ~103ft to the medical mall, sat outside for ~3 mins before returning inside. Pt returned to the room, left amb with needs within reach.  Zetta Bills Mobility Specialist 05/13/23 11:26 AM

## 2023-05-13 NOTE — Plan of Care (Signed)
  Problem: Education: Goal: Knowledge of General Education information will improve Description: Including pain rating scale, medication(s)/side effects and non-pharmacologic comfort measures Outcome: Progressing   Problem: Clinical Measurements: Goal: Ability to maintain clinical measurements within normal limits will improve Outcome: Progressing Goal: Cardiovascular complication will be avoided Outcome: Progressing   Problem: Activity: Goal: Risk for activity intolerance will decrease Outcome: Progressing   Problem: Nutrition: Goal: Adequate nutrition will be maintained Outcome: Progressing   Problem: Elimination: Goal: Will not experience complications related to bowel motility Outcome: Progressing   Problem: Education: Goal: Knowledge of General Education information will improve Description: Including pain rating scale, medication(s)/side effects and non-pharmacologic comfort measures Outcome: Progressing   Problem: Clinical Measurements: Goal: Will remain free from infection Outcome: Not Progressing   Problem: Pain Managment: Goal: General experience of comfort will improve Outcome: Not Progressing

## 2023-05-20 ENCOUNTER — Ambulatory Visit
Admission: RE | Admit: 2023-05-20 | Discharge: 2023-05-20 | Disposition: A | Discharge status: Home / Self Care | Payer: Medicaid Other | Source: Ambulatory Visit | Attending: Infectious Diseases | Admitting: Infectious Diseases

## 2023-05-20 ENCOUNTER — Other Ambulatory Visit: Payer: Self-pay | Admitting: Infectious Diseases

## 2023-05-20 ENCOUNTER — Ambulatory Visit: Payer: No Typology Code available for payment source

## 2023-05-20 VITALS — BP 129/90 | HR 91 | Temp 98.3°F | Resp 20

## 2023-05-20 DIAGNOSIS — R7881 Bacteremia: Secondary | ICD-10-CM | POA: Insufficient documentation

## 2023-05-20 DIAGNOSIS — B9561 Methicillin susceptible Staphylococcus aureus infection as the cause of diseases classified elsewhere: Secondary | ICD-10-CM | POA: Diagnosis not present

## 2023-05-20 LAB — BASIC METABOLIC PANEL
Anion gap: 6 (ref 5–15)
BUN: 7 mg/dL (ref 6–20)
CO2: 26 mmol/L (ref 22–32)
Calcium: 8.5 mg/dL — ABNORMAL LOW (ref 8.9–10.3)
Chloride: 103 mmol/L (ref 98–111)
Creatinine, Ser: 0.62 mg/dL (ref 0.61–1.24)
GFR, Estimated: >60 mL/min (ref >60)
Glucose, Bld: 123 mg/dL — ABNORMAL HIGH (ref 70–99)
Potassium: 3.9 mmol/L (ref 3.5–5.1)
Sodium: 135 mmol/L (ref 135–145)

## 2023-05-20 LAB — HEPATIC FUNCTION PANEL
ALT: 87 U/L — ABNORMAL HIGH (ref 0–44)
AST: 77 U/L — ABNORMAL HIGH (ref 15–41)
Albumin: 3.7 g/dL (ref 3.5–5.0)
Alkaline Phosphatase: 63 U/L (ref 38–126)
Bilirubin, Direct: 0.2 mg/dL (ref 0.0–0.2)
Indirect Bilirubin: 0.6 mg/dL (ref 0.3–0.9)
Total Bilirubin: 0.8 mg/dL (ref 0.3–1.2)
Total Protein: 7.9 g/dL (ref 6.5–8.1)

## 2023-05-20 LAB — CBC
HCT: 41.4 % (ref 39.0–52.0)
Hemoglobin: 13.3 g/dL (ref 13.0–17.0)
MCH: 28.7 pg (ref 26.0–34.0)
MCHC: 32.1 g/dL (ref 30.0–36.0)
MCV: 89.2 fL (ref 80.0–100.0)
Platelets: 101 10*3/uL — ABNORMAL LOW (ref 150–400)
RBC: 4.64 MIL/uL (ref 4.22–5.81)
RDW: 13.2 % (ref 11.5–15.5)
WBC: 5.4 10*3/uL (ref 4.0–10.5)
nRBC: 0 % (ref 0.0–0.2)

## 2023-05-20 LAB — SEDIMENTATION RATE: Sed Rate: 23 mm/h — ABNORMAL HIGH (ref 0–15)

## 2023-05-20 MED ORDER — DEXTROSE 5 % IV SOLN
1000.0000 mg | Freq: Once | INTRAVENOUS | Status: AC
  Administered 2023-05-20: 1000 mg via INTRAVENOUS
  Filled 2023-05-20: qty 50

## 2023-05-21 ENCOUNTER — Encounter: Payer: Medicaid Other | Admitting: Neurosurgery

## 2023-05-27 ENCOUNTER — Inpatient Hospital Stay: Payer: Medicaid Other | Admitting: Infectious Diseases

## 2023-05-29 ENCOUNTER — Ambulatory Visit: Payer: Medicaid Other | Attending: Infectious Diseases | Admitting: Infectious Diseases

## 2023-05-29 DIAGNOSIS — M4620 Osteomyelitis of vertebra, site unspecified: Secondary | ICD-10-CM | POA: Diagnosis not present

## 2023-05-29 NOTE — Progress Notes (Signed)
The purpose of this virtual visit is to provide medical care while limiting exposure to the novel coronavirus (COVID19) for both patient and office staff.   Consent was obtained for phone visit:  Yes.   Answered questions that patient had about telehealth interaction:  Yes.   I discussed the limitations, risks, security and privacy concerns of performing an evaluation and management service by telephone. I also discussed with the patient that there may be a patient responsible charge related to this service. The patient expressed understanding and agreed to proceed.   Patient Location: Home Provider Location:office Follow up visit for recent hospitalization 8/18-10/8 for MSSA bacteremia, Discitis and osteomyelitis of L2-L3 with epidural abscess /phlegmon s/p laminectomy and debridement on 03/24/23- culture MSSA Was on Iv nafcillin Underwent fusion on 04/10/23  Transforaminal Lumbar Interbody Fusion L2-3 2. Posterolateral arthrodesis L1 to L4 3. Posterior segmental instrumentation L1 to L4  4.  Lumbar decompression with total facetectomy on the right at L2-3 5.  Allograft placement in the 2 3 disc space for arthrodesis Culture sent was positive for MSSA HE received cefazolin and rifampin after the surgery HE received 32 days IV cefazolin post 2 nd surgery and on 10.7/24 he received long acting IV dalbavancin and DC home on Keflex 1 gram q6 + rifampin 300mg  BID until 05/22/23 followed by PO cephalexin 1 gram q6 for 6 weeks.HE also received a 2nd dose of dalbavancin on 05/20/23   HE could not come for today's visit because of court appt. HE cannot do vidoe hence Phone call HE is doing better Pain back is much improved HE is now taking cephalexin 1 gram Q6 regularly No side effects like rash- stool are soft but no diarrhea  Plan Continue Cephalexin 1 gram PO Q6 for 6 weeks until 1st week in dec 2024.  Follow up 07/10/23   Total time spent on the phone call 20 min

## 2023-06-04 ENCOUNTER — Telehealth: Payer: Self-pay | Admitting: Neurosurgery

## 2023-06-04 ENCOUNTER — Other Ambulatory Visit: Payer: Self-pay

## 2023-06-04 ENCOUNTER — Encounter: Payer: Self-pay | Admitting: Neurosurgery

## 2023-06-04 ENCOUNTER — Ambulatory Visit (INDEPENDENT_AMBULATORY_CARE_PROVIDER_SITE_OTHER): Payer: Medicaid Other | Admitting: Neurosurgery

## 2023-06-04 ENCOUNTER — Ambulatory Visit
Admission: RE | Admit: 2023-06-04 | Discharge: 2023-06-04 | Disposition: A | Discharge status: Home / Self Care | Payer: Medicaid Other | Attending: Neurosurgery | Admitting: Neurosurgery

## 2023-06-04 ENCOUNTER — Ambulatory Visit
Admission: RE | Admit: 2023-06-04 | Discharge: 2023-06-04 | Disposition: A | Discharge status: Home / Self Care | Payer: Medicaid Other | Source: Ambulatory Visit | Attending: Neurosurgery | Admitting: Neurosurgery

## 2023-06-04 VITALS — BP 140/96 | Ht 74.0 in | Wt 268.0 lb

## 2023-06-04 DIAGNOSIS — G061 Intraspinal abscess and granuloma: Secondary | ICD-10-CM

## 2023-06-04 DIAGNOSIS — M4856XD Collapsed vertebra, not elsewhere classified, lumbar region, subsequent encounter for fracture with routine healing: Secondary | ICD-10-CM

## 2023-06-04 DIAGNOSIS — Z09 Encounter for follow-up examination after completed treatment for conditions other than malignant neoplasm: Secondary | ICD-10-CM

## 2023-06-04 DIAGNOSIS — M4646 Discitis, unspecified, lumbar region: Secondary | ICD-10-CM

## 2023-06-04 NOTE — Telephone Encounter (Signed)
L2-3 laminectomy for abscess on 03/24/23  Patient seen today. He forgot to ask. He currently is unemployed, but would like to start looking for a job. What restrictions if any is he on?

## 2023-06-04 NOTE — Telephone Encounter (Signed)
I spoke to patient and informed him he should not lift anything more than 25 pounds until 12 weeks out. He should not have any restrictions after the first week of December. He is to call our office if he finds a job and needs a note with the 25 pound restriction.

## 2023-06-04 NOTE — Progress Notes (Signed)
   DOS: 03/24/2023, lumbar laminectomy for abscess evacuation, 04/10/2023, L1-L4 posterior spinal fusion with washout  HISTORY OF PRESENT ILLNESS: 06/04/2023 Mr. Adam Cabrera is status post lumbar decompression for spinal abscess as well as lumbar decompression and fusion for pathologic compression fracture.  Overall he is doing much better.  His strength is improved significantly.  He is able to ambulate without assistance.  He has been working with his infectious disease doctor to maintain his overall level of health with continued antibiotic regimen.  Overall he feels that he is improving significantly.  PHYSICAL EXAMINATION:   Vitals:   06/04/23 1126  BP: (!) 140/96   General: Patient is well developed, well nourished, calm, collected, and in no apparent distress.  NEUROLOGICAL:  General: In no acute distress.  Awake, alert, oriented to person, place, and time. Pupils equal round and reactive to light.   Strength: He strength is improved significantly.  He is at least 4+ out of 5 in the bilateral lower extremities.  Ambulating without assistance.  Incision c/d/i   ROS (Neurologic): Negative except as noted above  IMAGING: X-rays reviewed and demonstrate intact spinal instrumentation.  No evidence of hardware breakdown.  Will continue to follow for his final reads.  ASSESSMENT/PLAN:  Adam Cabrera is doing well after lumbar laminectomy and follow-up spinal fusion/redo decompression for pathologic compression fracture.  Overall he is doing much better.  He continues to have improved lower extremity strength.  He is ambulating without assistance.  States that his strength come back every day.  He has been on oral and IV antibiotics intermittently.  He continues to follow with infectious disease.  His x-rays today look stable without any evidence of early failure.  Overall he feels that he is doing well.  Incision is clean dry and intact.  Will continue to follow.   Lovenia Kim,  MD/MS Department of Neurosurgery

## 2023-06-10 ENCOUNTER — Telehealth: Payer: Self-pay

## 2023-06-10 ENCOUNTER — Other Ambulatory Visit
Admission: RE | Admit: 2023-06-10 | Discharge: 2023-06-10 | Disposition: A | Discharge status: Home / Self Care | Payer: Medicaid Other | Attending: Infectious Diseases | Admitting: Infectious Diseases

## 2023-06-10 DIAGNOSIS — M4646 Discitis, unspecified, lumbar region: Secondary | ICD-10-CM

## 2023-06-10 DIAGNOSIS — M4620 Osteomyelitis of vertebra, site unspecified: Secondary | ICD-10-CM | POA: Diagnosis present

## 2023-06-10 DIAGNOSIS — R7881 Bacteremia: Secondary | ICD-10-CM

## 2023-06-10 LAB — COMPREHENSIVE METABOLIC PANEL
ALT: 15 U/L (ref 0–44)
AST: 22 U/L (ref 15–41)
Albumin: 4.2 g/dL (ref 3.5–5.0)
Alkaline Phosphatase: 55 U/L (ref 38–126)
Anion gap: 8 (ref 5–15)
BUN: 9 mg/dL (ref 6–20)
CO2: 23 mmol/L (ref 22–32)
Calcium: 8.8 mg/dL — ABNORMAL LOW (ref 8.9–10.3)
Chloride: 106 mmol/L (ref 98–111)
Creatinine, Ser: 0.59 mg/dL — ABNORMAL LOW (ref 0.61–1.24)
GFR, Estimated: >60 mL/min (ref >60)
Glucose, Bld: 88 mg/dL (ref 70–99)
Potassium: 4.2 mmol/L (ref 3.5–5.1)
Sodium: 137 mmol/L (ref 135–145)
Total Bilirubin: 1.2 mg/dL — ABNORMAL HIGH (ref <1.2)
Total Protein: 8.2 g/dL — ABNORMAL HIGH (ref 6.5–8.1)

## 2023-06-10 LAB — CBC WITH DIFFERENTIAL/PLATELET
Abs Immature Granulocytes: 0.03 10*3/uL (ref 0.00–0.07)
Basophils Absolute: 0 10*3/uL (ref 0.0–0.1)
Basophils Relative: 0 %
Eosinophils Absolute: 0.1 10*3/uL (ref 0.0–0.5)
Eosinophils Relative: 2 %
HCT: 43.7 % (ref 39.0–52.0)
Hemoglobin: 14.9 g/dL (ref 13.0–17.0)
Immature Granulocytes: 0 %
Lymphocytes Relative: 27 %
Lymphs Abs: 1.9 10*3/uL (ref 0.7–4.0)
MCH: 28.9 pg (ref 26.0–34.0)
MCHC: 34.1 g/dL (ref 30.0–36.0)
MCV: 84.9 fL (ref 80.0–100.0)
Monocytes Absolute: 0.5 10*3/uL (ref 0.1–1.0)
Monocytes Relative: 6 %
Neutro Abs: 4.6 10*3/uL (ref 1.7–7.7)
Neutrophils Relative %: 65 %
Platelets: 99 10*3/uL — ABNORMAL LOW (ref 150–400)
RBC: 5.15 MIL/uL (ref 4.22–5.81)
RDW: 12.7 % (ref 11.5–15.5)
WBC: 7.1 10*3/uL (ref 4.0–10.5)
nRBC: 0 % (ref 0.0–0.2)

## 2023-06-10 LAB — SEDIMENTATION RATE: Sed Rate: 18 mm/h — ABNORMAL HIGH (ref 0–16)

## 2023-06-10 LAB — C-REACTIVE PROTEIN: CRP: 0.6 mg/dL (ref <1.0)

## 2023-06-10 NOTE — Telephone Encounter (Signed)
Patient called wanting to know what time his appointment is today, relayed that his next scheduled appointment isn't until December.   States he thought he was supposed to be getting labs today. Advised that lab orders are in to be completed this week, but that he does not need an appointment and can just stop by the lab at [facility]. Patient verbalized understanding and has no further questions.   Sandie Ano, RN

## 2023-06-11 ENCOUNTER — Telehealth: Payer: Self-pay

## 2023-06-11 NOTE — Telephone Encounter (Signed)
I attempted to reach the patient to relay lab results.  LVM for him to call back. Leanda Padmore Jonathon Resides, CMA

## 2023-06-11 NOTE — Telephone Encounter (Signed)
-----   Message from Lynn Ito sent at 06/11/2023  8:56 AM EST ----- Please let him know that from infection perspective his labs are normal now- Once he completes antibiotic we can get him in for HEPC assessment /treatment- thx ----- Message ----- From: Leory Plowman, Lab In [place] Sent: 06/10/2023  12:52 PM EST To: Lynn Ito, MD

## 2023-06-12 NOTE — Telephone Encounter (Signed)
Second attempt to reach patient, no answer. Message left with previous attempt.   Kai Calico D Jannelle Notaro, RN  

## 2023-06-13 NOTE — Telephone Encounter (Signed)
Patient returned call - patient aware of results and will follow up at appointment on 07/10/2023.   Chariti Havel Lesli Albee, CMA

## 2023-06-13 NOTE — Telephone Encounter (Signed)
Third attempt to reach patient, no answer. Left HIPAA compliant voicemail requesting callback.   Orah Sonnen D Teryl Gubler, RN  

## 2023-06-16 ENCOUNTER — Encounter: Payer: Medicaid Other | Admitting: Orthopedic Surgery

## 2023-06-24 ENCOUNTER — Other Ambulatory Visit: Payer: Self-pay | Admitting: Family Medicine

## 2023-06-24 DIAGNOSIS — M4646 Discitis, unspecified, lumbar region: Secondary | ICD-10-CM

## 2023-06-26 ENCOUNTER — Encounter: Payer: Medicaid Other | Admitting: Neurosurgery

## 2023-07-10 ENCOUNTER — Encounter: Payer: Self-pay | Admitting: Infectious Diseases

## 2023-07-10 ENCOUNTER — Ambulatory Visit: Payer: Medicaid Other | Attending: Infectious Diseases | Admitting: Infectious Diseases

## 2023-07-10 ENCOUNTER — Other Ambulatory Visit
Admission: RE | Admit: 2023-07-10 | Discharge: 2023-07-10 | Disposition: A | Discharge status: Home / Self Care | Payer: Medicaid Other | Source: Ambulatory Visit | Attending: Infectious Diseases | Admitting: Infectious Diseases

## 2023-07-10 VITALS — BP 124/86 | HR 86 | Temp 98.1°F | Ht 75.0 in | Wt 286.0 lb

## 2023-07-10 DIAGNOSIS — B182 Chronic viral hepatitis C: Secondary | ICD-10-CM | POA: Insufficient documentation

## 2023-07-10 DIAGNOSIS — M4646 Discitis, unspecified, lumbar region: Secondary | ICD-10-CM | POA: Diagnosis not present

## 2023-07-10 DIAGNOSIS — G061 Intraspinal abscess and granuloma: Secondary | ICD-10-CM | POA: Insufficient documentation

## 2023-07-10 DIAGNOSIS — G062 Extradural and subdural abscess, unspecified: Secondary | ICD-10-CM

## 2023-07-10 DIAGNOSIS — R7881 Bacteremia: Secondary | ICD-10-CM | POA: Diagnosis present

## 2023-07-10 DIAGNOSIS — B9561 Methicillin susceptible Staphylococcus aureus infection as the cause of diseases classified elsewhere: Secondary | ICD-10-CM | POA: Insufficient documentation

## 2023-07-10 DIAGNOSIS — Z23 Encounter for immunization: Secondary | ICD-10-CM | POA: Diagnosis not present

## 2023-07-10 DIAGNOSIS — A4901 Methicillin susceptible Staphylococcus aureus infection, unspecified site: Secondary | ICD-10-CM

## 2023-07-10 DIAGNOSIS — M4636 Infection of intervertebral disc (pyogenic), lumbar region: Secondary | ICD-10-CM | POA: Insufficient documentation

## 2023-07-10 DIAGNOSIS — Z981 Arthrodesis status: Secondary | ICD-10-CM | POA: Insufficient documentation

## 2023-07-10 DIAGNOSIS — F17210 Nicotine dependence, cigarettes, uncomplicated: Secondary | ICD-10-CM | POA: Diagnosis not present

## 2023-07-10 LAB — SEDIMENTATION RATE: Sed Rate: 10 mm/h (ref 0–15)

## 2023-07-10 LAB — COMPREHENSIVE METABOLIC PANEL
ALT: 36 U/L (ref 0–44)
AST: 25 U/L (ref 15–41)
Albumin: 4.7 g/dL (ref 3.5–5.0)
Alkaline Phosphatase: 59 U/L (ref 38–126)
Anion gap: 9 (ref 5–15)
BUN: 12 mg/dL (ref 6–20)
CO2: 25 mmol/L (ref 22–32)
Calcium: 9 mg/dL (ref 8.9–10.3)
Chloride: 104 mmol/L (ref 98–111)
Creatinine, Ser: 0.69 mg/dL (ref 0.61–1.24)
GFR, Estimated: >60 mL/min (ref >60)
Glucose, Bld: 106 mg/dL — ABNORMAL HIGH (ref 70–99)
Potassium: 3.8 mmol/L (ref 3.5–5.1)
Sodium: 138 mmol/L (ref 135–145)
Total Bilirubin: 1.2 mg/dL — ABNORMAL HIGH (ref <1.2)
Total Protein: 8.5 g/dL — ABNORMAL HIGH (ref 6.5–8.1)

## 2023-07-10 LAB — CBC WITH DIFFERENTIAL/PLATELET
Abs Immature Granulocytes: 0.06 10*3/uL (ref 0.00–0.07)
Basophils Absolute: 0 10*3/uL (ref 0.0–0.1)
Basophils Relative: 0 %
Eosinophils Absolute: 0.1 10*3/uL (ref 0.0–0.5)
Eosinophils Relative: 1 %
HCT: 46.8 % (ref 39.0–52.0)
Hemoglobin: 16 g/dL (ref 13.0–17.0)
Immature Granulocytes: 1 %
Lymphocytes Relative: 25 %
Lymphs Abs: 1.9 10*3/uL (ref 0.7–4.0)
MCH: 29.1 pg (ref 26.0–34.0)
MCHC: 34.2 g/dL (ref 30.0–36.0)
MCV: 85.2 fL (ref 80.0–100.0)
Monocytes Absolute: 0.4 10*3/uL (ref 0.1–1.0)
Monocytes Relative: 5 %
Neutro Abs: 5.3 10*3/uL (ref 1.7–7.7)
Neutrophils Relative %: 68 %
Platelets: 111 10*3/uL — ABNORMAL LOW (ref 150–400)
RBC: 5.49 MIL/uL (ref 4.22–5.81)
RDW: 14.2 % (ref 11.5–15.5)
WBC: 7.8 10*3/uL (ref 4.0–10.5)
nRBC: 0 % (ref 0.0–0.2)

## 2023-07-10 LAB — APTT: aPTT: 34 s (ref 24–36)

## 2023-07-10 LAB — C-REACTIVE PROTEIN: CRP: 0.9 mg/dL (ref <1.0)

## 2023-07-10 LAB — PROTIME-INR
INR: 1.1 (ref 0.8–1.2)
Prothrombin Time: 14.3 s (ref 11.4–15.2)

## 2023-07-10 NOTE — Progress Notes (Signed)
NAME: Adam Cabrera  DOB: 1985/12/17  MRN: [id number]  Date/Time: 07/10/2023 11:37 AM  REQUESTING PROVIDER Subjective:  REASON FOR CONSULT:  ? Adam Cabrera is a 37 y.o. with a history of MSSA bacteremia and Lumbar spine infection, HEPC, IVDA   hospitalization 8/18-10/8 for MSSA bacteremia, Discitis and osteomyelitis of L2-L3 with epidural abscess /phlegmon s/p laminectomy and debridement on 03/24/23- culture MSSA Was on Iv nafcillin Underwent fusion on 04/10/23  Transforaminal Lumbar Interbody Fusion L2-3 2. Posterolateral arthrodesis L1 to L4 3. Posterior segmental instrumentation L1 to L4  4.  Lumbar decompression with total facetectomy on the right at L2-3 5.  Allograft placement in the 2 3 disc space for arthrodesis Culture sent was positive for MSSA HE received cefazolin and rifampin after the surgery HE received 32 days IV cefazolin post 2 nd surgery and on 10.7/24 he received long acting IV dalbavancin and DC home on Keflex 1 gram q6 + rifampin 300mg  BID until 05/22/23 followed by PO cephalexin 1 gram q6 for 6 weeks.HE also received a 2nd dose of dalbavancin on 05/20/23 . He has completed 12 weeks of appropriate antibiotic post 2nd surgery Doing very well Even tried to play basket ball Past Medical History:  Diagnosis Date   Cellulitis     Past Surgical History:  Procedure Laterality Date   APPLICATION OF INTRAOPERATIVE CT SCAN N/A 04/10/2023   Procedure: APPLICATION OF INTRAOPERATIVE CT SCAN;  Surgeon: Lovenia Kim, MD;  Location: [facility] ORS;  Service: Neurosurgery;  Laterality: N/A;   LUMBAR LAMINECTOMY FOR EPIDURAL ABSCESS N/A 03/24/2023   Procedure: LUMBAR LAMINECTOMY L2-3 FOR ABSCESS;  Surgeon: Lovenia Kim, MD;  Location: [facility] ORS;  Service: Neurosurgery;  Laterality: N/A;   POSTERIOR LUMBAR FUSION 4 LEVEL N/A 04/10/2023   Procedure: L1-L4 Posterior Spinal Fusion, Repeat washout, Right sided L2-3 facectomy, transforaminal arthrodesis L2-3 with bone graft and posterior  lateral arthrodesis;  Surgeon: Lovenia Kim, MD;  Location: [facility] ORS;  Service: Neurosurgery;  Laterality: N/A;  3D Carm, Brain Lab, [place]    Social History   Socioeconomic History   Marital status: Single    Spouse name: Not on file   Number of children: Not on file   Years of education: Not on file   Highest education level: Not on file  Occupational History   Not on file  Tobacco Use   Smoking status: Every Day    Types: Cigarettes   Smokeless tobacco: Never  Vaping Use   Vaping status: Never Used  Substance and Sexual Activity   Alcohol use: Yes    Comment: socially    Drug use: Not Currently   Sexual activity: Not on file  Other Topics Concern   Not on file  Social History Narrative   Not on file   Social Determinants of Health   Financial Resource Strain: Not on file  Food Insecurity: Patient Declined (04/17/2023)   Hunger Vital Sign    Worried About Running Out of Food in the Last Year: Patient declined    Ran Out of Food in the Last Year: Patient declined  Transportation Needs: Patient Declined (04/17/2023)   PRAPARE - Administrator, Civil Service (Medical): Patient declined    Lack of Transportation (Non-Medical): Patient declined  Physical Activity: Not on file  Stress: Not on file  Social Connections: Not on file  Intimate Partner Violence: Patient Declined (04/17/2023)   Humiliation, Afraid, Rape, and Kick questionnaire    Fear of Current or Ex-Partner: Patient  declined    Emotionally Abused: Patient declined    Physically Abused: Patient declined    Sexually Abused: Patient declined    No family history on file. No Known Allergies I? No current outpatient medications on file.   No current facility-administered medications for this visit.     Abtx:  Anti-infectives (From admission, onward)    None       REVIEW OF SYSTEMS:  Const: negative fever, negative chills, negative weight loss Eyes: negative diplopia or visual changes,  negative eye pain ENT: negative coryza, negative sore throat Resp: negative cough, hemoptysis, dyspnea Cards: negative for chest pain, palpitations, lower extremity edema GU: negative for frequency, dysuria and hematuria GI: Negative for abdominal pain, diarrhea, bleeding, constipation Skin: negative for rash and pruritus Heme: negative for easy bruising and gum/nose bleeding MS: minimal back pain Neurolo:negative for headaches, dizziness, vertigo, memory problems  Psych: negative for feelings of anxiety, depression  Endocrine: negative for thyroid, diabetes Allergy/Immunology- negative for any medication or food allergies ?  VITALS:  BP 124/86   Pulse 86   Temp 98.1 F (36.7 C) (Temporal)   Ht 6\' 3"  (1.905 m)   Wt 286 lb (129.7 kg)   BMI 35.75 kg/m  LDA Foley Central line Other drainage tubes PHYSICAL EXAM:  General: Alert, cooperative, no distress, appears stated age.  Head: Normocephalic, without obvious abnormality, atraumatic. Eyes: Conjunctivae clear, anicteric sclerae. Pupils are equal ENT Nares normal. No drainage or sinus tenderness. Lips, mucosa, and tongue normal. No Thrush Neck: Supple, symmetrical, no adenopathy, thyroid: non tender no carotid bruit and no JVD. Back: No CVA tenderness. Lungs: Clear to auscultation bilaterally. No Wheezing or Rhonchi. No rales. Heart: Regular rate and rhythm, no murmur, rub or gallop. Abdomen: Soft, non-tender,not distended. Bowel sounds normal. No masses Extremities: atraumatic, no cyanosis. No edema. No clubbing Skin: No rashes or lesions. Or bruising Lymph: Cervical, supraclavicular normal. Neurologic: Grossly non-focal Pertinent Labs Lab Results CBC    Component Value Date/Time   WBC 7.1 06/10/2023 1226   RBC 5.15 06/10/2023 1226   HGB 14.9 06/10/2023 1226   HCT 43.7 06/10/2023 1226   PLT 99 (L) 06/10/2023 1226   MCV 84.9 06/10/2023 1226   MCH 28.9 06/10/2023 1226   MCHC 34.1 06/10/2023 1226   RDW 12.7  06/10/2023 1226   LYMPHSABS 1.9 06/10/2023 1226   MONOABS 0.5 06/10/2023 1226   EOSABS 0.1 06/10/2023 1226   BASOSABS 0.0 06/10/2023 1226       Latest Ref Rng & Units 06/10/2023   12:26 PM 05/20/2023    1:00 PM 05/13/2023    4:27 AM  CMP  Glucose 70 - 99 mg/dL 88  387  92   BUN 6 - 20 mg/dL 9  7  13    Creatinine 0.61 - 1.24 mg/dL 5.64  3.32  9.51   Sodium 135 - 145 mmol/L 137  135  137   Potassium 3.5 - 5.1 mmol/L 4.2  3.9  3.6   Chloride 98 - 111 mmol/L 106  103  104   CO2 22 - 32 mmol/L 23  26  28    Calcium 8.9 - 10.3 mg/dL 8.8  8.5  8.3   Total Protein 6.5 - 8.1 g/dL 8.2  7.9  7.0   Total Bilirubin <1.2 mg/dL 1.2  0.8  0.8   Alkaline Phos 38 - 126 U/L 55  63  56   AST 15 - 41 U/L 22  77  53   ALT 0 - 44  U/L 15  87  44    No results found for this or any previous visit (from the past 240 hour(s)).   ? Impression/Recommendation ?MSSA bacteremia, Lumbar discitis L2-L3, epidural abscess First wash out on 8/19 and underwent fusion on 9/5 Pt has been on antibiotics since aug After the fusion surgery he has taken 12 weeks of antibiotics He completed keflex last week He does not need any more antibiotics Will check ESR/CRP and blood culture Follow up with neurosurgeon   HEPC - active =no complication Will get labs today and prepare for treatment with Epclusa Get US liver  Will start HEPA and hepB vaccination today Folo wup after HEPC test results   Discussed the management with patient in detail  _________________________________________  ESR 10 and CRP 0.9-- N  _______ Discussed with patient, requesting provider Note:  This document was prepared using Dragon voice recognition software and may include unintentional dictation errors.

## 2023-07-10 NOTE — Patient Instructions (Signed)
Mr. Swagger, you visited [place] today for a follow-up after your spinal infection surgery and to discuss your Hepatitis C treatment. You reported overall improvement with minimal pain and are gradually increasing your physical activity. We also discussed starting treatment for your Hepatitis C.  YOUR PLAN:  -LUMBAR DISCITIS AND OSTEOMYELITIS: Lumbar Discitis and Osteomyelitis is an infection of the spinal discs and bones. You have completed 12 weeks of antibiotics after your second surgery and are showing improvement. We will order labs to ensure there is no residual infection and check your inflammatory markers. The results will be communicated with your neurosurgeon.  -HEPATITIS C: Hepatitis C is a liver infection caused by the Hepatitis C virus. We will order a Hepatitis C genotype test, a liver ultrasound, and a FibroSure test to assess your liver's condition. Today, we will start your Hepatitis A and B vaccinations. Once we have your lab results and insurance approval, we will begin treatment with Epclusa.  -GENERAL HEALTH MAINTENANCE: We will continue to monitor your recovery from spinal surgery and initiate Hepatitis C treatment once lab results are available. Today, we will start your Hepatitis A and B vaccinations.  INSTRUCTIONS:  Please complete the lab tests as soon as possible. We will contact you with the results and further instructions. If you experience any new or worsening symptoms, please contact our office immediately.

## 2023-07-11 LAB — HCV RNA QUANT
HCV Quantitative Log: 3.54 {Log_IU}/mL (ref >1.70)
HCV Quantitative: 3470 [IU]/mL (ref >50)

## 2023-07-12 LAB — HCV FIBROSURE
ALPHA 2-MACROGLOBULINS, QN: 280 mg/dL — ABNORMAL HIGH (ref 110–276)
ALT (SGPT) P5P: 39 [IU]/L (ref 0–55)
Apolipoprotein A-1: 114 mg/dL (ref 101–178)
Bilirubin, Total: 0.8 mg/dL (ref 0.0–1.2)
Fibrosis Score: 0.65 — ABNORMAL HIGH (ref 0.00–0.21)
GGT: 28 [IU]/L (ref 0–65)
Haptoglobin: 33 mg/dL (ref 17–317)
Necroinflammat Activity Score: 0.31 — ABNORMAL HIGH (ref 0.00–0.17)

## 2023-07-13 LAB — HEPATITIS C GENOTYPE

## 2023-07-15 ENCOUNTER — Ambulatory Visit
Admission: RE | Admit: 2023-07-15 | Discharge: 2023-07-15 | Disposition: A | Discharge status: Home / Self Care | Payer: Medicaid Other | Attending: Neurosurgery | Admitting: Neurosurgery

## 2023-07-15 ENCOUNTER — Ambulatory Visit
Admission: RE | Admit: 2023-07-15 | Discharge: 2023-07-15 | Disposition: A | Discharge status: Home / Self Care | Payer: Medicaid Other | Source: Ambulatory Visit | Attending: Neurosurgery | Admitting: *Deleted

## 2023-07-15 ENCOUNTER — Ambulatory Visit: Payer: Medicaid Other | Admitting: Neurosurgery

## 2023-07-15 ENCOUNTER — Encounter: Payer: Self-pay | Admitting: Neurosurgery

## 2023-07-15 VITALS — BP 122/82 | Ht 75.0 in | Wt 288.4 lb

## 2023-07-15 DIAGNOSIS — Z09 Encounter for follow-up examination after completed treatment for conditions other than malignant neoplasm: Secondary | ICD-10-CM

## 2023-07-15 DIAGNOSIS — G061 Intraspinal abscess and granuloma: Secondary | ICD-10-CM

## 2023-07-15 DIAGNOSIS — M4646 Discitis, unspecified, lumbar region: Secondary | ICD-10-CM

## 2023-07-15 DIAGNOSIS — M4856XD Collapsed vertebra, not elsewhere classified, lumbar region, subsequent encounter for fracture with routine healing: Secondary | ICD-10-CM | POA: Diagnosis not present

## 2023-07-15 LAB — CULTURE, BLOOD (ROUTINE X 2)
Culture: NO GROWTH
Special Requests: ADEQUATE

## 2023-07-15 NOTE — Progress Notes (Addendum)
   DOS: 03/24/2023, lumbar laminectomy for abscess evacuation, 04/10/2023, L1-L4 posterior spinal fusion with washout  HISTORY OF PRESENT ILLNESS: 07/15/23 Adam Cabrera is a 37 year old presenting today about 3 months status post lumbar fusion for spondylodiscitis.  He is doing well with minimal pain.  He states he has completed his antibiotic treatment and has follow-up with infectious disease.  He does intermittently have some discomfort in his back but improved significantly with NSAIDs.  He denies any new or worsening symptoms.  06/04/23 Adam Cabrera is status post lumbar decompression for spinal abscess as well as lumbar decompression and fusion for pathologic compression fracture.  Overall he is doing much better.  His strength is improved significantly.  He is able to ambulate without assistance.  He has been working with his infectious disease doctor to maintain his overall level of health with continued antibiotic regimen.  Overall he feels that he is improving significantly.  PHYSICAL EXAMINATION:   There were no vitals filed for this visit.  General: Patient is well developed, well nourished, calm, collected, and in no apparent distress.  NEUROLOGICAL:  General: In no acute distress.  A&O.  Strength:good and equal strength in bilateral lower extremities  ambulating without assistance.  Incision well-healed  ROS (Neurologic): Negative except as noted above  IMAGING: 07/15/23 lumbar xrays There is no evidence of hardware malfunction.  ASSESSMENT/PLAN:  Adam Cabrera is doing well after lumbar laminectomy and follow-up spinal fusion/redo decompression for pathologic compression fracture.  He continues to do well with no interval changes.  His x-rays are reassuring.  Will see him back in 6 months with lumbar x-rays or sooner should he have any questions or concerns.  He expressed understanding and is in agreement with this plan.  Manning Charity PA-C Department of Neurosurgery

## 2023-07-16 ENCOUNTER — Ambulatory Visit: Payer: Medicaid Other

## 2023-07-22 ENCOUNTER — Other Ambulatory Visit: Payer: Self-pay

## 2023-07-22 ENCOUNTER — Telehealth: Payer: Self-pay | Admitting: Pharmacist

## 2023-07-22 ENCOUNTER — Other Ambulatory Visit (HOSPITAL_COMMUNITY): Payer: Self-pay

## 2023-07-22 ENCOUNTER — Other Ambulatory Visit: Payer: Self-pay | Admitting: Pharmacist

## 2023-07-22 DIAGNOSIS — B182 Chronic viral hepatitis C: Secondary | ICD-10-CM

## 2023-07-22 MED ORDER — SOFOSBUVIR-VELPATASVIR 400-100 MG PO TABS
1.0000 | ORAL_TABLET | Freq: Every day | ORAL | 2 refills | Status: AC
  Filled 2023-07-22: qty 28, 28d supply, fill #0

## 2023-07-22 NOTE — Progress Notes (Signed)
Specialty Pharmacy Initial Fill Coordination Note  Adam Cabrera is a 37 y.o. male contacted today regarding initial fill of specialty medication(s) Sofosbuvir-Velpatasvir   Patient requested Delivery   Delivery date: 07/24/23   Verified address: [address] [place] [address]   Medication will be filled on 07/23/23.   Patient is aware of 4.00 copayment.  Put on AR/ACCT

## 2023-07-22 NOTE — Telephone Encounter (Signed)
Sounds good thank you

## 2023-07-22 NOTE — Progress Notes (Signed)
Also sounds good - we will have Lupita Leash and Toni Amend investigate Epclusa coverage for him as well.

## 2023-07-22 NOTE — Progress Notes (Signed)
Specialty Pharmacy Initiation Note   Adam Cabrera is a 37 y.o. male who will be followed by the specialty pharmacy service for RxSp Hepatitis C    Review of administration, indication, effectiveness, safety, potential side effects, storage/disposable, and missed dose instructions occurred today for patient's specialty medication(s) Sofosbuvir-Velpatasvir     Patient/Caregiver did not have any additional questions or concerns.   Patient's therapy is appropriate to: Initiate    Goals Addressed             This Visit's Progress    Achieve virologic cure as evidenced by SVR       Patient is initiating therapy. Patient will be evaluated at upcoming provider appointment to assess progress      Comply with lab assessments       Patient is initiating therapy. Patient will adhere to provider and/or lab appointments      Maintain optimal adherence to therapy       Patient is initiating therapy. Patient will be evaluated at upcoming provider appointment to assess progress         Jennette Kettle Specialty Pharmacist

## 2023-07-22 NOTE — Progress Notes (Signed)
Sent!

## 2023-07-22 NOTE — Telephone Encounter (Signed)
Reviewed counseling points with him today; he will receive Epclusa in the mail on Thursday. He asked if you want him to wait to start treatment until he has ultrasound results.   Margarite Gouge, PharmD, CPP, BCIDP, AAHIVP Clinical Pharmacist Practitioner Infectious Diseases Clinical Pharmacist [facility]

## 2023-07-22 NOTE — Progress Notes (Signed)
Patient is approved to receive Epclusa x 12 weeks for chronic Hepatitis C infection. Counseled patient to take Epclusa daily with or without food. Encouraged patient not to miss any doses and explained how their chance of cure could go down with each dose missed. Counseled patient on what to do if dose is missed - if it is closer to the missed dose take immediately; if closer to next dose skip dose and take the next dose at the usual time. Counseled patient on common side effects such as headache, fatigue, and nausea and that these normally decrease with time. I reviewed patient medications and found no drug interactions. Discussed with patient that there are several drug interactions including acid suppressants. Instructed patient to call clinic if he wishes to start a new medication during course of therapy. Also advised patient to call if he experiences any side effects. Patient will follow-up with Dr. Rivka Safer in January.  Margarite Gouge, PharmD, CPP, BCIDP, AAHIVP Clinical Pharmacist Practitioner Infectious Diseases Clinical Pharmacist [facility]

## 2023-07-23 ENCOUNTER — Other Ambulatory Visit: Payer: Self-pay

## 2023-07-24 ENCOUNTER — Other Ambulatory Visit (HOSPITAL_COMMUNITY): Payer: Self-pay

## 2023-07-28 ENCOUNTER — Ambulatory Visit: Payer: Medicaid Other | Attending: Infectious Diseases

## 2023-08-07 ENCOUNTER — Ambulatory Visit: Payer: Medicaid Other | Admitting: Infectious Diseases

## 2023-08-08 ENCOUNTER — Other Ambulatory Visit (HOSPITAL_COMMUNITY): Payer: Self-pay

## 2023-08-11 ENCOUNTER — Other Ambulatory Visit: Payer: Self-pay

## 2023-08-13 ENCOUNTER — Other Ambulatory Visit: Payer: Self-pay

## 2023-09-15 ENCOUNTER — Other Ambulatory Visit (HOSPITAL_COMMUNITY): Payer: Self-pay

## 2023-10-08 ENCOUNTER — Other Ambulatory Visit (HOSPITAL_COMMUNITY): Payer: Self-pay

## 2023-10-16 ENCOUNTER — Other Ambulatory Visit (HOSPITAL_COMMUNITY): Payer: Self-pay

## 2023-10-16 ENCOUNTER — Telehealth: Payer: Self-pay | Admitting: Pharmacist

## 2023-10-16 NOTE — Telephone Encounter (Signed)
 Patient filled first Epclusa bottle 07/23/23 and has not received any refills from [facility] since then. [facility] call center staff has attempted to contact him ~7 times with his current phone number. He unfortunately does not have MyChart at this time. He also missed an appointment with you on 08/07/23. Sending as an Financial planner.  Margarite Gouge, PharmD, CPP, BCIDP, AAHIVP Clinical Pharmacist Practitioner Infectious Diseases Clinical Pharmacist [facility]

## 2023-10-21 ENCOUNTER — Other Ambulatory Visit (HOSPITAL_COMMUNITY): Payer: Self-pay

## 2023-11-19 ENCOUNTER — Other Ambulatory Visit: Payer: Self-pay | Admitting: Pharmacist

## 2023-11-19 NOTE — Progress Notes (Signed)
 Inactivating as clinic and call center staff have not been able to reach patient since December. Only filled one month of Epclusa; unsure of adherence.   Nicklas Barns, PharmD, CPP, BCIDP, AAHIVP Clinical Pharmacist Practitioner Infectious Diseases Clinical Pharmacist [facility]

## 2024-01-09 ENCOUNTER — Other Ambulatory Visit: Payer: Self-pay

## 2024-01-09 DIAGNOSIS — G061 Intraspinal abscess and granuloma: Secondary | ICD-10-CM

## 2024-01-14 ENCOUNTER — Ambulatory Visit: Payer: Medicaid Other | Admitting: Neurosurgery

## 2024-04-06 ENCOUNTER — Telehealth: Payer: Self-pay

## 2024-04-06 NOTE — Telephone Encounter (Signed)
 Attempted to reach the patient. No answer and no VM setup

## 2024-04-06 NOTE — Telephone Encounter (Signed)
-----   Message from [facility] sent at 04/02/2024  8:35 AM EDT ----- This patient never completed hepc treatment in 2024 - after the first month they were not able to contact him- Can you call when you get a chance and see whether he wants to return for treatment? thx

## 2024-04-09 ENCOUNTER — Other Ambulatory Visit: Payer: Self-pay | Admitting: Infectious Diseases
